# Patient Record
Sex: Female | Born: 1965 | Race: White | Hispanic: No | Marital: Married | Smoking: Former smoker
Health system: Southern US, Community
[De-identification: ages and names within clinical notes are randomized; demographics above are authoritative.]

## PROBLEM LIST (undated history)

## (undated) DIAGNOSIS — R519 Headache, unspecified: Secondary | ICD-10-CM

## (undated) DIAGNOSIS — K219 Gastro-esophageal reflux disease without esophagitis: Secondary | ICD-10-CM

## (undated) DIAGNOSIS — R51 Headache: Secondary | ICD-10-CM

## (undated) DIAGNOSIS — E039 Hypothyroidism, unspecified: Secondary | ICD-10-CM

## (undated) DIAGNOSIS — E079 Disorder of thyroid, unspecified: Secondary | ICD-10-CM

## (undated) HISTORY — PX: ABDOMINAL HYSTERECTOMY: SHX81

## (undated) HISTORY — DX: Disorder of thyroid, unspecified: E07.9

## (undated) HISTORY — PX: TONSILLECTOMY: SHX5217

## (undated) HISTORY — PX: CHOLECYSTECTOMY: SHX55

## (undated) HISTORY — PX: DIAGNOSTIC LAPAROSCOPY: SUR761

## (undated) HISTORY — DX: Headache, unspecified: R51.9

## (undated) HISTORY — DX: Headache: R51

---
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
## 1999-05-25 ENCOUNTER — Other Ambulatory Visit: Admission: RE | Admit: 1999-05-25 | Discharge: 1999-05-25 | Payer: Self-pay | Admitting: Obstetrics and Gynecology

## 2000-07-21 ENCOUNTER — Other Ambulatory Visit: Admission: RE | Admit: 2000-07-21 | Discharge: 2000-07-21 | Payer: Self-pay | Admitting: Obstetrics and Gynecology

## 2001-03-11 HISTORY — PX: APPENDECTOMY: SHX54

## 2001-11-16 ENCOUNTER — Other Ambulatory Visit: Admission: RE | Admit: 2001-11-16 | Discharge: 2001-11-16 | Payer: Self-pay | Admitting: Obstetrics and Gynecology

## 2002-10-27 ENCOUNTER — Other Ambulatory Visit: Admission: RE | Admit: 2002-10-27 | Discharge: 2002-10-27 | Payer: Self-pay | Admitting: Obstetrics and Gynecology

## 2003-05-29 ENCOUNTER — Inpatient Hospital Stay (HOSPITAL_COMMUNITY): Admission: AD | Admit: 2003-05-29 | Discharge: 2003-06-04 | Payer: Self-pay | Admitting: Obstetrics and Gynecology

## 2003-06-01 ENCOUNTER — Encounter (INDEPENDENT_AMBULATORY_CARE_PROVIDER_SITE_OTHER): Payer: Self-pay | Admitting: Specialist

## 2003-06-05 ENCOUNTER — Encounter: Admission: RE | Admit: 2003-06-05 | Discharge: 2003-07-05 | Payer: Self-pay | Admitting: Obstetrics and Gynecology

## 2003-06-28 ENCOUNTER — Other Ambulatory Visit: Admission: RE | Admit: 2003-06-28 | Discharge: 2003-06-28 | Payer: Self-pay | Admitting: Obstetrics and Gynecology

## 2003-07-06 ENCOUNTER — Encounter: Admission: RE | Admit: 2003-07-06 | Discharge: 2003-08-05 | Payer: Self-pay | Admitting: Obstetrics and Gynecology

## 2003-09-05 ENCOUNTER — Encounter: Admission: RE | Admit: 2003-09-05 | Discharge: 2003-10-05 | Payer: Self-pay | Admitting: Obstetrics and Gynecology

## 2004-09-17 ENCOUNTER — Ambulatory Visit: Payer: Self-pay | Admitting: Cardiology

## 2006-11-21 ENCOUNTER — Ambulatory Visit (HOSPITAL_COMMUNITY): Admission: RE | Admit: 2006-11-21 | Discharge: 2006-11-21 | Payer: Self-pay | Admitting: Obstetrics and Gynecology

## 2007-04-19 ENCOUNTER — Emergency Department (HOSPITAL_COMMUNITY): Admission: EM | Admit: 2007-04-19 | Discharge: 2007-04-19 | Payer: Self-pay | Admitting: Emergency Medicine

## 2008-01-31 ENCOUNTER — Emergency Department (HOSPITAL_COMMUNITY): Admission: EM | Admit: 2008-01-31 | Discharge: 2008-01-31 | Payer: Self-pay | Admitting: Family Medicine

## 2010-07-27 NOTE — Discharge Summary (Signed)
[name], [name] NO.:  [id number]   MEDICAL RECORD NO.:  [id number]                   PATIENT TYPE:  INP   LOCATION:  9142                                 FACILITY:  [facility]   PHYSICIAN:  Maxie Better, M.D.            DATE OF BIRTH:  1965/05/17   DATE OF ADMISSION:  05/29/2003  DATE OF DISCHARGE:  06/04/2003                                 DISCHARGE SUMMARY   ADMISSION DIAGNOSES:  1. Post dates.  2. Polyhydramnios.   DISCHARGE DIAGNOSES:  1. Post dates, delivered.  2. Polyhydramnios.  3. Arrest of dilatation.  4. Presumed chorioamnionitis.  5. Submucosal fibroid.  6. Postoperative anemia.  7. Gestational thrombocytopenia.   PROCEDURE:  Primary cesarean section, submucosal myomectomy.   HISTORY OF PRESENT ILLNESS:  A 45 year old gravida 3 para 0-0-2-0 female at  51 and four-sevenths weeks gestation with known polyhydramnios admitted for  induction secondary to post dates.   HOSPITAL COURSE:  The patient was admitted on May 29, 2003.  She underwent  several episodes of cervical ripening and Pitocin induction.  A Foley  balloon intracervical placement was done.  On May 31, 2003 she  subsequently had artificial rupture of membranes performed.  Intrauterine  pressure catheter and internal fetal scalp electrode were placed.  The  patient developed a temperature during her labor thought to be secondary to  chorioamnionitis and was started on ampicillin and gentamycin.  Pitocin was  continued; however, the patient did not progress beyond 4 cm.  She was taken  to the operating room where she underwent a primary cesarean section.  The  procedure resulted in the delivery of a live female, 8 pounds 5 ounces,  Apgars of 8 and 9.  There was a submucosal fibroid noted on palpation of the  intracavitary area and this was removed, sent to pathology.  Final pathology  confirmed leiomyoma.  Placenta was bilobed; otherwise, unremarkable.  The  patient was continued on antibiotics postoperatively and antibiotics were  discontinued when she became afebrile.  Her CBC on postoperative day #1  showed a hemoglobin of 10.3; hematocrit 29.3; platelet count of 111,000.  Her admission platelet count was 124,000 on May 29, 2003.  By  postoperative day #3 the patient was afebrile, was tolerating a regular  diet, without any evidence of infection, was deemed well to be discharged  home.   DISPOSITION:  Home.   CONDITION:  Stable.   DISCHARGE MEDICATIONS:  1. Tylox one to two tablets q.3-4h. p.r.n. pain.  2. Prenatal vitamins one p.o. daily.  3. Slow-Fe one p.o. daily.  4. Motrin 600 mg one p.o. q.6-8h. p.r.n. pain.   DISCHARGE INSTRUCTIONS:  Per postpartum booklet given.   FOLLOW-UP:  Follow-up appointment at [facility] in 4 weeks.  Maxie Better, M.D.    [place]/MEDQ  D:  06/24/2003  T:  06/24/2003  Job:  [id number]

## 2010-07-27 NOTE — Op Note (Signed)
[name], [name] NO.:  [id number]   MEDICAL RECORD NO.:  [id number]                   PATIENT TYPE:  INP   LOCATION:  9142                                 FACILITY:  [facility]   PHYSICIAN:  Maxie Better, M.D.            DATE OF BIRTH:  1965-05-01   DATE OF PROCEDURE:  06/01/2003  DATE OF DISCHARGE:                                 OPERATIVE REPORT   PREOPERATIVE DIAGNOSES:  1. Arrest of dilatation.  2. Post dates.  3. Presumed chorioamnionitis.  4. Polyhydramnios.   PROCEDURES:  1. Primary cesarean section.  2. Submucosal myomectomy.   POSTOPERATIVE DIAGNOSES:  1. Arrest of dilatation.  2. Post dates.  3. Polyhydramnios.  4. Presumed chorioamnionitis.  5. Submucosal fibroid.   ANESTHESIA:  Epidural.   SURGEON:  Maxie Better, M.D.   ASSISTANT:  Genia Del, M.D.   INDICATIONS:  This is a 45 year old gravida 3, para 0-0-2-0, female at 46-  4/7 weeks' gestation on May 29, 2003, who at that time was admitted for  induction of labor secondary to post dates and polyhydramnios.  The  patient's cervix was long and closed.  She was admitted on May 29, 2003,  and had plans for cervical ripening with Cervidil.  However, on presentation  the patient had too frequent contractions for Cervidil; therefore, low-dose  Pitocin was started.  Pitocin was discontinued during the night due to  questions of possible late decelerations, which subsequently resolved and  the tracing was otherwise reactive.  The following morning the patient  initially was restarted on the Pitocin; however, due to the cervix remaining  unfavorable and the contractions somewhat spaced, Cervidil was placed for  cervical ripening.  This was removed at about 9:30 p.m.  The patient was  rested and started on Pitocin on May 31, 2003.  The cervix, however, had  not done a marked change with the Cervidil and therefore a Foley balloon was  placed intracervically for  manual dilatation of the cervix.  The Pitocin was  continued.  The Foley balloon was finally out at around 12:30 a.m. on June 01, 2003, at which time the cervix was 3-4 cm dilated, about 80% effaced, -  2, vertex presentation.  Artificial rupture of membranes was performed at  that time, clear fluid was noted.  An intrauterine pressure catheter as well  as an internal fetal scalp electrode was placed.  Pitocin was continued.  At  around 4:30 a.m. the patient spiked a temperature to 100.7.  She was started  on ampicillin and gentamicin for presumed chorioamnionitis and given Tylenol  for fever.  The patient had an epidural placement.  By the examination by  the R.N., she was 5-6 cm dilated; however, about an hour later on re-  examination, the patient was noted to be 4 cm with an edematous cervix.  The  patient was continued on her Pitocin; however, she  did not have any further  dilatation and a decision was therefore made to proceed with a primary  cesarean section in light of the examination.  The risks and benefits of the  procedure had been explained to the patient and husband.  Consent was  signed.  The patient was transferred to the operating room.  Clindamycin was  added to her antibiotic regimen.   PROCEDURE:  Under adequate epidural anesthesia, the patient was placed in  the supine position with a left lateral tilt.  An indwelling Foley catheter  was already in place.  The patient was sterilely prepped and draped in the  usual fashion.  A Pfannenstiel skin incision was made after 0.25% Marcaine  was injected along the planned incision line.  The Pfannenstiel incision was  carried down to the rectus fascia.  The rectus fascia is incised in the  midline and extended bilaterally.  The rectus fascia was then bluntly and  sharply dissected off the rectus muscle in a superior and inferior fashion.  The rectus muscle was split in the midline.  The parietal peritoneum was  entered  sharply and extended superiorly and inferiorly.  The vesicouterine  peritoneum was then opened and extended transversely.  The bladder was then  bluntly dissected off the lower uterine segment and displaced inferiorly  with the bladder retractor.  A curvilinear low transverse uterine incision  was then made and extended bilaterally using bandage scissors.  On entry  into the pelvis, the baby was noted to be direct occiput posterior  presentation.  The baby was delivered as a live female from that position,  bulb-suctioned on the abdomen.  The cord was clamped, cut, the baby was  transferred to the awaiting pediatrician, who subsequently assigned Apgars  of 8 and 9 at one and five minutes.  Subsequent weight of the baby was 8  pounds 5 ounces.  The placenta was noted to be posterior, which was manually  removed.  The uterine cavity was then cleaned of debris, at which time there  was a palpable mass on the anterior wall of the uterus in intracavitary  location.  The finding was felt to be consistent with probably a submucosal  fibroid and was amenable for removal.  After making sure that all debris had  been removed from the uterine cavity, the Kocher was then used to grasp the  mass and the mass was then removed.  Due to its location, sutures were not  placed in the bed of the myoma site.  The uterine incision had no extension.  It was closed in two layers.  The first layer was a 0 Monocryl running  locked stitch.  The second layer was imbricated using 0 Monocryl suture.  Small bleeders along the peritoneal edges were cauterized.  On inspection of  the uterus, no other fibroid lesions were noted.  Both tubes and ovaries  were noted to be normal.  The abdomen was then irrigated and suctioned of  debris.  The paracolic gutters were cleaned of debris.  Reinspection of the  uterine incision showed good hemostasis.  The parietal peritoneum and the vesicouterine peritoneum were not closed.  The  undersurface of the rectus  fascia was inspected, small bleeders cauterized.  The rectus fascia was then  closed with 0 Vicryl x2, the subcutaneous areas irrigated, suctioned, small  bleeders cauterized, and the skin approximated using Ethicon staples.  Specimen was placenta, sent to pathology, as well as the submucosal myoma.  Estimated blood loss  was about 800 mL.  Intraoperative fluids 2500 mL  crystalloid.  Urine output was 150 mL clear yellow urine.  Sponge and  instrument count x2 was correct.  Complication was none.  The patient  tolerated the procedure well and was transferred to the recovery room in  stable condition.                                               Maxie Better, M.D.    [place]/MEDQ  D:  06/01/2003  T:  06/02/2003  Job:  [id number]

## 2010-07-31 ENCOUNTER — Other Ambulatory Visit: Payer: Self-pay | Admitting: Internal Medicine

## 2010-07-31 DIAGNOSIS — R1011 Right upper quadrant pain: Secondary | ICD-10-CM

## 2010-08-09 ENCOUNTER — Ambulatory Visit
Admission: RE | Admit: 2010-08-09 | Discharge: 2010-08-09 | Disposition: A | Discharge status: Home / Self Care | Payer: BC Managed Care – PPO | Source: Ambulatory Visit | Attending: Internal Medicine | Admitting: Internal Medicine

## 2010-08-09 DIAGNOSIS — R1011 Right upper quadrant pain: Secondary | ICD-10-CM

## 2010-08-29 ENCOUNTER — Encounter (INDEPENDENT_AMBULATORY_CARE_PROVIDER_SITE_OTHER): Payer: Self-pay | Admitting: General Surgery

## 2010-09-05 ENCOUNTER — Other Ambulatory Visit (INDEPENDENT_AMBULATORY_CARE_PROVIDER_SITE_OTHER): Payer: Self-pay | Admitting: General Surgery

## 2010-09-05 ENCOUNTER — Encounter (HOSPITAL_COMMUNITY): Payer: BC Managed Care – PPO

## 2010-09-05 ENCOUNTER — Ambulatory Visit (HOSPITAL_COMMUNITY)
Admission: RE | Admit: 2010-09-05 | Discharge: 2010-09-05 | Disposition: A | Discharge status: Home / Self Care | Payer: BC Managed Care – PPO | Source: Ambulatory Visit | Attending: General Surgery | Admitting: General Surgery

## 2010-09-05 DIAGNOSIS — K8020 Calculus of gallbladder without cholecystitis without obstruction: Secondary | ICD-10-CM | POA: Insufficient documentation

## 2010-09-05 DIAGNOSIS — Z01812 Encounter for preprocedural laboratory examination: Secondary | ICD-10-CM | POA: Insufficient documentation

## 2010-09-05 DIAGNOSIS — Z01811 Encounter for preprocedural respiratory examination: Secondary | ICD-10-CM | POA: Insufficient documentation

## 2010-09-05 LAB — COMPREHENSIVE METABOLIC PANEL
ALT: 21 U/L (ref 0–35)
Albumin: 4.2 g/dL (ref 3.5–5.2)
Alkaline Phosphatase: 86 U/L (ref 39–117)
CO2: 29 mEq/L (ref 19–32)
Chloride: 102 mEq/L (ref 96–112)
Creatinine, Ser: 0.65 mg/dL (ref 0.50–1.10)
GFR calc non Af Amer: >60 mL/min (ref >60)
Glucose, Bld: 92 mg/dL (ref 70–99)
Potassium: 4.1 mEq/L (ref 3.5–5.1)
Total Bilirubin: 0.7 mg/dL (ref 0.3–1.2)
Total Protein: 6.7 g/dL (ref 6.0–8.3)

## 2010-09-05 LAB — CBC
HCT: 39.7 % (ref 36.0–46.0)
RBC: 4.46 MIL/uL (ref 3.87–5.11)
WBC: 8.9 10*3/uL (ref 4.0–10.5)

## 2010-09-05 LAB — DIFFERENTIAL
Basophils Absolute: 0 10*3/uL (ref 0.0–0.1)
Lymphs Abs: 2.8 10*3/uL (ref 0.7–4.0)
Neutrophils Relative %: 59 % (ref 43–77)

## 2010-09-05 LAB — PROTIME-INR
INR: 1.06 (ref 0.00–1.49)
Prothrombin Time: 14 seconds (ref 11.6–15.2)

## 2010-09-17 ENCOUNTER — Ambulatory Visit (HOSPITAL_COMMUNITY)
Admission: RE | Admit: 2010-09-17 | Discharge: 2010-09-17 | Disposition: A | Discharge status: Home / Self Care | Payer: BC Managed Care – PPO | Source: Ambulatory Visit | Attending: General Surgery | Admitting: General Surgery

## 2010-09-17 ENCOUNTER — Ambulatory Visit (HOSPITAL_COMMUNITY): Payer: BC Managed Care – PPO

## 2010-09-17 ENCOUNTER — Other Ambulatory Visit (INDEPENDENT_AMBULATORY_CARE_PROVIDER_SITE_OTHER): Payer: Self-pay | Admitting: General Surgery

## 2010-09-17 DIAGNOSIS — Z79899 Other long term (current) drug therapy: Secondary | ICD-10-CM | POA: Insufficient documentation

## 2010-09-17 DIAGNOSIS — K8010 Calculus of gallbladder with chronic cholecystitis without obstruction: Secondary | ICD-10-CM | POA: Insufficient documentation

## 2010-09-17 DIAGNOSIS — F17200 Nicotine dependence, unspecified, uncomplicated: Secondary | ICD-10-CM | POA: Insufficient documentation

## 2010-09-17 DIAGNOSIS — E039 Hypothyroidism, unspecified: Secondary | ICD-10-CM | POA: Insufficient documentation

## 2010-09-18 NOTE — Op Note (Signed)
[name], [name] NO.:  [id number]  MEDICAL RECORD NO.:  [id number]  LOCATION:  DAYL                         FACILITY:  [facility]  PHYSICIAN:  Adolph Pollack, M.D.DATE OF BIRTH:  23-May-1965  DATE OF PROCEDURE:  09/17/2010 DATE OF DISCHARGE:                              OPERATIVE REPORT   PREOPERATIVE DIAGNOSIS:  Symptomatic cholelithiasis.  POSTOPERATIVE DIAGNOSIS:  Symptomatic cholelithiasis.  PROCEDURE:  Laparoscopic cholecystectomy with intraoperative cholangiogram.  SURGEON:  Adolph Pollack, M.D.  ASSISTANT:  Consuello Bossier, MD  ANESTHESIA:  General.  INDICATIONS:  Ms. Deschamps is a 45 year old female who has been having problems with symptomatic cholelithiasis for over 2-1/2 years.  Recently she had some elevation of her transaminases.  She has more frequent episodes.  She now presents for elective laparoscopic cholecystectomy. Procedure risks and aftercare were discussed with her preoperatively. Her white blood cell count and liver function tests were normal preoperatively.  TECHNIQUE:  She was seen in the holding area, then brought to the operating room, placed supine on the operating room table and general anesthetic was administered.  The abdominal wall was widely sterilely prepped and draped.  In the subumbilical region, Marcaine was infiltrated into the subcutaneous tissues.  A small subumbilical incision was made through the skin and subcutaneous tissue.  The midline fascia was identified and a small incision made in it.  The midline fascia was retracted anteriorly.  The peritoneal cavity was then entered under direct vision.  A pursestring suture of 0 Vicryl was placed around the fascial edges.  A Hassan trocar was introduced into the peritoneal cavity and pneumoperitoneum created by insufflation of CO2 gas.  Next, a laparoscope was introduced.  There was no underlying organ injury or bleeding.  A 5-mm trocar was placed through  an epigastric incision and two 5-mm trocars were placed through right upper quadrant incisions.  She was placed in reverse Trendelenburg position with right side tilted slightly up.  The fundus of the gallbladder was grasped. There were multiple adhesions between the omentum and gallbladder and the liver near the fundus of the gallbladder.  These were taken down with electrocautery and countertraction.  The fundus was then retracted towards the right shoulder.  The infundibulum was grasped and using dissection on the gallbladder, I mobilized the infundibulum.  It was retracted laterally.  I identified the cystic duct and created a window around it and achieved the critical view.  A clip was placed at the cystic duct gallbladder junction.  I identified the cystic artery and created a window around it and clipped it twice on the proximal side.  A small incision was made into the cystic duct.  A cholangiocath was passed through the abdominal wall placed into the cystic duct and cholangiogram was performed.  Under real-time fluoroscopy, dilute contrast was injected into the cystic duct.  The common hepatic, right and left hepatic and common bile ducts all filled promptly.  Contrast drained promptly into the duodenum .  A round, nonmobile, nonobstructing lucency was noted in the common bile duct.  It appeared to be an overlying bony shadow versus a stone.  Final report is pending radiologist interpretation.  Following  this, the cholangiocatheter was removed, the cystic duct was clipped 3 times on the biliary side and divided.  I then dissected out the cystic artery and clipped it close to the gallbladder.  Two previous clips had already been placed proximally.  It was divided.  I then dissected the gallbladder free from liver using electrocautery.  At the midbody of the gallbladder appeared to be a blood vessel or accessory duct going directly into gallbladder.  This was clipped on the  liver side and divided with electrocautery on the gallbladder side.  The gallbladder was then removed from the liver and placed in Endopouch bag. The gallbladder fossa was irrigated and bleeding was controlled with electrocautery.  The Endopouch bag was removed through the subumbilical port.  Gallbladder was sent to pathology.  Further inspection the gallbladder fossa demonstrated no evidence of bleeding or bile leak.  Irrigation fluid was evacuated as much as possible.  The subumbilical trocar was removed and the fascial defect closed under laparoscopic vision by tightening up and tying down the pursestring suture.  The carbon dioxide gas was released and the trocar removed. Skin incisions were closed with 4-0 Monocryl subcuticular stitches. Steri-Strips and sterile dressing applied.  She tolerated the procedure well without apparent complications and was taken to recovery room in satisfactory condition.     Adolph Pollack, M.D.     Kari Baars  D:  09/17/2010  T:  09/17/2010  Job:  [id number]  cc:   Juline Patch, M.D. Fax: [phone]  Electronically Signed by Avel Peace M.D. on 09/18/2010 10:30:07 AM

## 2010-10-11 ENCOUNTER — Ambulatory Visit (INDEPENDENT_AMBULATORY_CARE_PROVIDER_SITE_OTHER): Payer: BC Managed Care – PPO | Admitting: General Surgery

## 2010-10-11 ENCOUNTER — Encounter (INDEPENDENT_AMBULATORY_CARE_PROVIDER_SITE_OTHER): Payer: Self-pay | Admitting: General Surgery

## 2010-10-11 DIAGNOSIS — K8020 Calculus of gallbladder without cholecystitis without obstruction: Secondary | ICD-10-CM | POA: Insufficient documentation

## 2010-10-11 NOTE — Patient Instructions (Signed)
You may have a gallstone in your bile duct or it may be a bone shadow.  I recommend considering an MRI to figure out which it is.  You could also doing nothing at this time and just see how things went.  Please call us and let us know which of these you would like to do.

## 2010-10-11 NOTE — Progress Notes (Signed)
Operation:  Laparoscopic cholecystectomy  Date:  September 17, 2010  Pathology:  Benign  HPI:  She is here for her first postoperative visit. She's feels well and has no complaints. She wishes she would have had the operation sooner.  No diarrhea or food intolerance. Her cholangiogram did demonstrate the possibility of sludge or a stone.  Intraoperatively, it looked like it could be a bone shadow. I explained this to her.   Physical Exam: Gen.-she looks well and in no acute distress.  Abdomen-soft, incisions clean, dry, intact. No tenderness.  Assessment:  Doing well postop. There is a question of choledocholithiasis.  Plan:  We discussed options of expected management versus MRI. I recommended leaning toward the MRI to rule this in or out. She wants to speak with her husband. I've asked her to call us back when she has made the decision how she would like to proceed.

## 2010-11-30 LAB — URINALYSIS, ROUTINE W REFLEX MICROSCOPIC
Bilirubin Urine: NEGATIVE
Glucose, UA: NEGATIVE
Hgb urine dipstick: NEGATIVE
Ketones, ur: NEGATIVE
Nitrite: NEGATIVE
Protein, ur: NEGATIVE
Specific Gravity, Urine: 1.013
Urobilinogen, UA: 0.2
pH: 7

## 2010-11-30 LAB — POCT PREGNANCY, URINE
Operator id: 192351
Preg Test, Ur: NEGATIVE

## 2010-12-11 LAB — LIPASE, BLOOD: Lipase: 17

## 2010-12-11 LAB — CBC
HCT: 39.2
MCHC: 34.9
MCV: 92
RDW: 12.4

## 2010-12-11 LAB — COMPREHENSIVE METABOLIC PANEL
CO2: 26
Potassium: 3.7
Sodium: 135
Total Bilirubin: 1.3 — ABNORMAL HIGH
Total Protein: 6.6

## 2010-12-11 LAB — URINE MICROSCOPIC-ADD ON

## 2010-12-11 LAB — DIFFERENTIAL
Lymphs Abs: 0.6 — ABNORMAL LOW
Monocytes Absolute: 0.2
Monocytes Relative: 2 — ABNORMAL LOW
Neutro Abs: 10.9 — ABNORMAL HIGH

## 2010-12-11 LAB — POCT PREGNANCY, URINE: Preg Test, Ur: NEGATIVE

## 2010-12-11 LAB — URINALYSIS, ROUTINE W REFLEX MICROSCOPIC
Glucose, UA: NEGATIVE
Ketones, ur: >80 — AB
Nitrite: NEGATIVE
pH: 6

## 2012-09-14 ENCOUNTER — Other Ambulatory Visit: Payer: Self-pay | Admitting: Obstetrics and Gynecology

## 2012-09-18 ENCOUNTER — Encounter (HOSPITAL_COMMUNITY): Payer: Self-pay | Admitting: Pharmacist

## 2012-09-29 ENCOUNTER — Encounter (HOSPITAL_COMMUNITY): Payer: Self-pay | Admitting: *Deleted

## 2012-09-29 ENCOUNTER — Encounter (HOSPITAL_COMMUNITY)
Admission: RE | Disposition: A | Discharge status: Home / Self Care | Payer: Self-pay | Source: Ambulatory Visit | Attending: Obstetrics and Gynecology

## 2012-09-29 ENCOUNTER — Ambulatory Visit (HOSPITAL_COMMUNITY)
Admission: RE | Admit: 2012-09-29 | Discharge: 2012-09-29 | Disposition: A | Discharge status: Home / Self Care | Payer: BC Managed Care – PPO | Source: Ambulatory Visit | Attending: Obstetrics and Gynecology | Admitting: Obstetrics and Gynecology

## 2012-09-29 ENCOUNTER — Encounter (HOSPITAL_COMMUNITY): Payer: Self-pay | Admitting: Anesthesiology

## 2012-09-29 ENCOUNTER — Ambulatory Visit (HOSPITAL_COMMUNITY): Payer: BC Managed Care – PPO | Admitting: Anesthesiology

## 2012-09-29 DIAGNOSIS — N920 Excessive and frequent menstruation with regular cycle: Secondary | ICD-10-CM | POA: Insufficient documentation

## 2012-09-29 DIAGNOSIS — D251 Intramural leiomyoma of uterus: Secondary | ICD-10-CM | POA: Insufficient documentation

## 2012-09-29 DIAGNOSIS — N946 Dysmenorrhea, unspecified: Secondary | ICD-10-CM | POA: Insufficient documentation

## 2012-09-29 DIAGNOSIS — Z90710 Acquired absence of both cervix and uterus: Secondary | ICD-10-CM

## 2012-09-29 DIAGNOSIS — N7013 Chronic salpingitis and oophoritis: Secondary | ICD-10-CM | POA: Insufficient documentation

## 2012-09-29 HISTORY — PX: ROBOTIC ASSISTED TOTAL HYSTERECTOMY: SHX6085

## 2012-09-29 LAB — CBC
HCT: 42.2 % (ref 36.0–46.0)
HCT: 39.6 % (ref 36.0–46.0)
Hemoglobin: 14.9 g/dL (ref 12.0–15.0)
MCHC: 35.9 g/dL (ref 30.0–36.0)
MCV: 87.8 fL (ref 78.0–100.0)
RBC: 4.81 MIL/uL (ref 3.87–5.11)
RDW: 12.3 % (ref 11.5–15.5)
RDW: 12.3 % (ref 11.5–15.5)
WBC: 9.5 10*3/uL (ref 4.0–10.5)

## 2012-09-29 LAB — BASIC METABOLIC PANEL
BUN: 6 mg/dL (ref 6–23)
CO2: 29 mEq/L (ref 19–32)
CO2: 26 mEq/L (ref 19–32)
Chloride: 102 mEq/L (ref 96–112)
Chloride: 102 mEq/L (ref 96–112)
Creatinine, Ser: 0.72 mg/dL (ref 0.50–1.10)
Creatinine, Ser: 0.66 mg/dL (ref 0.50–1.10)
GFR calc Af Amer: >90 mL/min (ref >90)
GFR calc Af Amer: >90 mL/min (ref >90)
Potassium: 3.5 mEq/L (ref 3.5–5.1)
Sodium: 139 mEq/L (ref 135–145)

## 2012-09-29 SURGERY — ROBOTIC ASSISTED TOTAL HYSTERECTOMY
Anesthesia: General | Site: Abdomen | Laterality: Bilateral | Wound class: Clean Contaminated

## 2012-09-29 MED ORDER — FENTANYL CITRATE 0.05 MG/ML IJ SOLN
INTRAMUSCULAR | Status: AC
  Filled 2012-09-29: qty 5

## 2012-09-29 MED ORDER — METHYLENE BLUE 1 % INJ SOLN
INTRAMUSCULAR | Status: DC | PRN
  Administered 2012-09-29: 1 mL

## 2012-09-29 MED ORDER — MIDAZOLAM HCL 2 MG/2ML IJ SOLN
INTRAMUSCULAR | Status: AC
  Filled 2012-09-29: qty 2

## 2012-09-29 MED ORDER — BUPIVACAINE HCL (PF) 0.25 % IJ SOLN
INTRAMUSCULAR | Status: AC
  Filled 2012-09-29: qty 60

## 2012-09-29 MED ORDER — DEXAMETHASONE SODIUM PHOSPHATE 10 MG/ML IJ SOLN
INTRAMUSCULAR | Status: AC
  Filled 2012-09-29: qty 1

## 2012-09-29 MED ORDER — KETOROLAC TROMETHAMINE 30 MG/ML IJ SOLN
INTRAMUSCULAR | Status: AC
  Filled 2012-09-29: qty 1

## 2012-09-29 MED ORDER — PHENYLEPHRINE HCL 10 MG/ML IJ SOLN
INTRAMUSCULAR | Status: DC | PRN
  Administered 2012-09-29: 40 ug via INTRAVENOUS
  Administered 2012-09-29 (×3): 80 ug via INTRAVENOUS

## 2012-09-29 MED ORDER — ACETAMINOPHEN 10 MG/ML IV SOLN: 1000.0000 mg | Freq: Four times a day (QID) | INTRAVENOUS | Status: DC

## 2012-09-29 MED ORDER — ACETAMINOPHEN 10 MG/ML IV SOLN
INTRAVENOUS | Status: AC
  Filled 2012-09-29: qty 100

## 2012-09-29 MED ORDER — KETOROLAC TROMETHAMINE 30 MG/ML IJ SOLN: 30.0000 mg | Freq: Four times a day (QID) | INTRAMUSCULAR | Status: DC

## 2012-09-29 MED ORDER — DEXAMETHASONE SODIUM PHOSPHATE 10 MG/ML IJ SOLN
INTRAMUSCULAR | Status: DC | PRN
  Administered 2012-09-29: 10 mg via INTRAVENOUS

## 2012-09-29 MED ORDER — ONDANSETRON HCL 4 MG/2ML IJ SOLN: 4.0000 mg | Freq: Four times a day (QID) | INTRAMUSCULAR | Status: DC | PRN

## 2012-09-29 MED ORDER — GLYCOPYRROLATE 0.2 MG/ML IJ SOLN
INTRAMUSCULAR | Status: DC | PRN
  Administered 2012-09-29: 0.4 mg via INTRAVENOUS

## 2012-09-29 MED ORDER — MIDAZOLAM HCL 5 MG/5ML IJ SOLN
INTRAMUSCULAR | Status: DC | PRN
  Administered 2012-09-29: 2 mg via INTRAVENOUS

## 2012-09-29 MED ORDER — ONDANSETRON HCL 4 MG PO TABS: 4.0000 mg | ORAL_TABLET | Freq: Four times a day (QID) | ORAL | Status: DC | PRN

## 2012-09-29 MED ORDER — CEFAZOLIN SODIUM-DEXTROSE 2-3 GM-% IV SOLR
INTRAVENOUS | Status: AC
  Filled 2012-09-29: qty 50

## 2012-09-29 MED ORDER — KETOROLAC TROMETHAMINE 30 MG/ML IJ SOLN: 15.0000 mg | Freq: Once | INTRAMUSCULAR | Status: DC | PRN

## 2012-09-29 MED ORDER — OXYCODONE-ACETAMINOPHEN 5-325 MG PO TABS: 1.0000 | ORAL_TABLET | ORAL | Status: DC | PRN

## 2012-09-29 MED ORDER — MENTHOL 3 MG MT LOZG: 1.0000 | LOZENGE | OROMUCOSAL | Status: DC | PRN

## 2012-09-29 MED ORDER — ONDANSETRON HCL 4 MG/2ML IJ SOLN
INTRAMUSCULAR | Status: DC | PRN
  Administered 2012-09-29: 4 mg via INTRAVENOUS

## 2012-09-29 MED ORDER — BUPIVACAINE HCL (PF) 0.25 % IJ SOLN
INTRAMUSCULAR | Status: DC | PRN
  Administered 2012-09-29: 12 mL

## 2012-09-29 MED ORDER — ONDANSETRON HCL 4 MG/2ML IJ SOLN
INTRAMUSCULAR | Status: AC
  Filled 2012-09-29: qty 2

## 2012-09-29 MED ORDER — NEOSTIGMINE METHYLSULFATE 1 MG/ML IJ SOLN
INTRAMUSCULAR | Status: AC
  Filled 2012-09-29: qty 1

## 2012-09-29 MED ORDER — FENTANYL CITRATE 0.05 MG/ML IJ SOLN
INTRAMUSCULAR | Status: AC
  Filled 2012-09-29: qty 2

## 2012-09-29 MED ORDER — HYDROMORPHONE HCL PF 1 MG/ML IJ SOLN: 0.2000 mg | INTRAMUSCULAR | Status: DC | PRN

## 2012-09-29 MED ORDER — IBUPROFEN 800 MG PO TABS
800.0000 mg | ORAL_TABLET | Freq: Three times a day (TID) | ORAL | Status: DC | PRN
  Administered 2012-09-29: 800 mg via ORAL
  Filled 2012-09-29: qty 1

## 2012-09-29 MED ORDER — IBUPROFEN 800 MG PO TABS: 800.0000 mg | ORAL_TABLET | Freq: Three times a day (TID) | ORAL | Status: DC | PRN

## 2012-09-29 MED ORDER — METHYLENE BLUE 1 % INJ SOLN
INTRAMUSCULAR | Status: AC
  Filled 2012-09-29: qty 1

## 2012-09-29 MED ORDER — PROPOFOL 10 MG/ML IV BOLUS
INTRAVENOUS | Status: DC | PRN
  Administered 2012-09-29: 180 mg via INTRAVENOUS

## 2012-09-29 MED ORDER — ROCURONIUM BROMIDE 100 MG/10ML IV SOLN
INTRAVENOUS | Status: DC | PRN
  Administered 2012-09-29: 5 mg via INTRAVENOUS
  Administered 2012-09-29: 10 mg via INTRAVENOUS
  Administered 2012-09-29: 40 mg via INTRAVENOUS
  Administered 2012-09-29: 10 mg via INTRAVENOUS

## 2012-09-29 MED ORDER — ZOLPIDEM TARTRATE 5 MG PO TABS: 5.0000 mg | ORAL_TABLET | Freq: Every evening | ORAL | Status: DC | PRN

## 2012-09-29 MED ORDER — NEOSTIGMINE METHYLSULFATE 1 MG/ML IJ SOLN
INTRAMUSCULAR | Status: DC | PRN
  Administered 2012-09-29: 3 mg via INTRAVENOUS

## 2012-09-29 MED ORDER — FENTANYL CITRATE 0.05 MG/ML IJ SOLN
25.0000 ug | INTRAMUSCULAR | Status: DC | PRN
  Administered 2012-09-29 (×3): 50 ug via INTRAVENOUS

## 2012-09-29 MED ORDER — KETOROLAC TROMETHAMINE 30 MG/ML IJ SOLN
INTRAMUSCULAR | Status: DC | PRN
  Administered 2012-09-29: 30 mg via INTRAVENOUS

## 2012-09-29 MED ORDER — CEFAZOLIN SODIUM-DEXTROSE 2-3 GM-% IV SOLR
2.0000 g | INTRAVENOUS | Status: AC
  Administered 2012-09-29: 2 g via INTRAVENOUS

## 2012-09-29 MED ORDER — PANTOPRAZOLE SODIUM 40 MG PO TBEC
40.0000 mg | DELAYED_RELEASE_TABLET | Freq: Every day | ORAL | Status: DC
  Administered 2012-09-29: 40 mg via ORAL
  Filled 2012-09-29 (×2): qty 1

## 2012-09-29 MED ORDER — GLYCOPYRROLATE 0.2 MG/ML IJ SOLN
INTRAMUSCULAR | Status: AC
  Filled 2012-09-29: qty 2

## 2012-09-29 MED ORDER — LIDOCAINE HCL (CARDIAC) 20 MG/ML IV SOLN
INTRAVENOUS | Status: AC
  Filled 2012-09-29: qty 5

## 2012-09-29 MED ORDER — ACETAMINOPHEN 10 MG/ML IV SOLN
1000.0000 mg | Freq: Once | INTRAVENOUS | Status: AC
  Administered 2012-09-29: 1000 mg via INTRAVENOUS

## 2012-09-29 MED ORDER — ROCURONIUM BROMIDE 50 MG/5ML IV SOLN
INTRAVENOUS | Status: AC
  Filled 2012-09-29: qty 1

## 2012-09-29 MED ORDER — DIPHENHYDRAMINE HCL 50 MG/ML IJ SOLN
INTRAMUSCULAR | Status: DC | PRN
  Administered 2012-09-29: 25 mg via INTRAVENOUS

## 2012-09-29 MED ORDER — PROPOFOL 10 MG/ML IV EMUL
INTRAVENOUS | Status: AC
  Filled 2012-09-29: qty 20

## 2012-09-29 MED ORDER — LACTATED RINGERS IV SOLN
INTRAVENOUS | Status: DC
  Administered 2012-09-29 (×3): via INTRAVENOUS

## 2012-09-29 MED ORDER — LIDOCAINE HCL (CARDIAC) 20 MG/ML IV SOLN
INTRAVENOUS | Status: DC | PRN
  Administered 2012-09-29: 50 mg via INTRAVENOUS

## 2012-09-29 MED ORDER — LACTATED RINGERS IR SOLN
Status: DC | PRN
  Administered 2012-09-29: 3000 mL

## 2012-09-29 MED ORDER — DIPHENHYDRAMINE HCL 50 MG/ML IJ SOLN
INTRAMUSCULAR | Status: AC
  Filled 2012-09-29: qty 1

## 2012-09-29 MED ORDER — PHENYLEPHRINE 40 MCG/ML (10ML) SYRINGE FOR IV PUSH (FOR BLOOD PRESSURE SUPPORT)
PREFILLED_SYRINGE | INTRAVENOUS | Status: AC
  Filled 2012-09-29: qty 10

## 2012-09-29 MED ORDER — DEXTROSE IN LACTATED RINGERS 5 % IV SOLN: INTRAVENOUS | Status: DC

## 2012-09-29 MED ORDER — FENTANYL CITRATE 0.05 MG/ML IJ SOLN
INTRAMUSCULAR | Status: DC | PRN
  Administered 2012-09-29: 100 ug via INTRAVENOUS
  Administered 2012-09-29: 25 ug via INTRAVENOUS
  Administered 2012-09-29: 100 ug via INTRAVENOUS
  Administered 2012-09-29: 25 ug via INTRAVENOUS

## 2012-09-29 MED ORDER — LEVOTHYROXINE SODIUM 50 MCG PO TABS
50.0000 ug | ORAL_TABLET | Freq: Every day | ORAL | Status: DC
  Filled 2012-09-29 (×2): qty 1

## 2012-09-29 SURGICAL SUPPLY — 77 items
APPLICATOR COTTON TIP 6IN STRL (MISCELLANEOUS) ×3 IMPLANT
BAG URINE DRAINAGE (UROLOGICAL SUPPLIES) ×3 IMPLANT
BARRIER ADHS 3X4 INTERCEED (GAUZE/BANDAGES/DRESSINGS) ×3 IMPLANT
CABLE HIGH FREQUENCY MONO STRZ (ELECTRODE) IMPLANT
CATH FOLEY 3WAY  5CC 16FR (CATHETERS) ×1
CATH FOLEY 3WAY 5CC 16FR (CATHETERS) ×2 IMPLANT
CATH ROBINSON RED A/P 16FR (CATHETERS) ×3 IMPLANT
CHLORAPREP W/TINT 26ML (MISCELLANEOUS) ×3 IMPLANT
CLOTH BEACON ORANGE TIMEOUT ST (SAFETY) ×3 IMPLANT
CONT PATH 16OZ SNAP LID 3702 (MISCELLANEOUS) ×3 IMPLANT
COVER MAYO STAND STRL (DRAPES) ×3 IMPLANT
COVER TABLE BACK 60X90 (DRAPES) ×6 IMPLANT
COVER TIP SHEARS 8 DVNC (MISCELLANEOUS) ×2 IMPLANT
COVER TIP SHEARS 8MM DA VINCI (MISCELLANEOUS) ×1
DECANTER SPIKE VIAL GLASS SM (MISCELLANEOUS) ×3 IMPLANT
DERMABOND ADVANCED (GAUZE/BANDAGES/DRESSINGS) ×1
DERMABOND ADVANCED .7 DNX12 (GAUZE/BANDAGES/DRESSINGS) ×2 IMPLANT
DEVICE TROCAR PUNCTURE CLOSURE (ENDOMECHANICALS) IMPLANT
DRAPE HUG U DISPOSABLE (DRAPE) ×3 IMPLANT
DRAPE LG THREE QUARTER DISP (DRAPES) ×6 IMPLANT
DRAPE WARM FLUID 44X44 (DRAPE) ×3 IMPLANT
ELECT LIGASURE LONG (ELECTRODE) IMPLANT
ELECT REM PT RETURN 9FT ADLT (ELECTROSURGICAL) ×3
ELECTRODE REM PT RTRN 9FT ADLT (ELECTROSURGICAL) ×2 IMPLANT
EVACUATOR SMOKE 8.L (FILTER) ×3 IMPLANT
FORCEPS CUTTING 33CM 5MM (CUTTING FORCEPS) IMPLANT
FORCEPS CUTTING 45CM 5MM (CUTTING FORCEPS) IMPLANT
GAUZE VASELINE 3X9 (GAUZE/BANDAGES/DRESSINGS) IMPLANT
GLOVE BIO SURGEON STRL SZ 6.5 (GLOVE) ×3 IMPLANT
GLOVE BIOGEL PI IND STRL 7.0 (GLOVE) ×4 IMPLANT
GLOVE BIOGEL PI INDICATOR 7.0 (GLOVE) ×2
GOWN PREVENTION PLUS LG XLONG (DISPOSABLE) ×9 IMPLANT
GOWN STRL REIN XL XLG (GOWN DISPOSABLE) ×18 IMPLANT
HEMOSTAT SURGICEL 4X8 (HEMOSTASIS) ×3 IMPLANT
KIT ACCESSORY DA VINCI DISP (KITS) ×1
KIT ACCESSORY DVNC DISP (KITS) ×2 IMPLANT
LEGGING LITHOTOMY PAIR STRL (DRAPES) ×3 IMPLANT
NEEDLE INSUFFLATION 120MM (ENDOMECHANICALS) ×3 IMPLANT
NEEDLE INSUFFLATION 14GA 120MM (NEEDLE) ×3 IMPLANT
NS IRRIG 1000ML POUR BTL (IV SOLUTION) ×3 IMPLANT
OCCLUDER COLPOPNEUMO (BALLOONS) ×3 IMPLANT
PACK LAPAROSCOPY BASIN (CUSTOM PROCEDURE TRAY) ×3 IMPLANT
PACK LAVH (CUSTOM PROCEDURE TRAY) ×3 IMPLANT
PAD PREP 24X48 CUFFED NSTRL (MISCELLANEOUS) ×6 IMPLANT
PLUG CATH AND CAP STER (CATHETERS) ×3 IMPLANT
POUCH SPECIMEN RETRIEVAL 10MM (ENDOMECHANICALS) IMPLANT
PROTECTOR NERVE ULNAR (MISCELLANEOUS) ×6 IMPLANT
SCISSORS LAP 5X35 DISP (ENDOMECHANICALS) IMPLANT
SET CYSTO W/LG BORE CLAMP LF (SET/KITS/TRAYS/PACK) IMPLANT
SET IRRIG TUBING LAPAROSCOPIC (IRRIGATION / IRRIGATOR) ×3 IMPLANT
SOLUTION ELECTROLUBE (MISCELLANEOUS) ×3 IMPLANT
SUT VIC AB 0 CT1 36 (SUTURE) ×6 IMPLANT
SUT VICRYL 0 UR6 27IN ABS (SUTURE) ×3 IMPLANT
SUT VICRYL 4-0 PS2 18IN ABS (SUTURE) ×12 IMPLANT
SUT VLOC 180 0 9IN  GS21 (SUTURE) ×2
SUT VLOC 180 0 9IN GS21 (SUTURE) ×4 IMPLANT
SYR 50ML LL SCALE MARK (SYRINGE) ×3 IMPLANT
SYRINGE 10CC LL (SYRINGE) ×3 IMPLANT
SYSTEM CONVERTIBLE TROCAR (TROCAR) IMPLANT
TIP RUMI ORANGE 6.7MMX12CM (TIP) IMPLANT
TIP UTERINE 5.1X6CM LAV DISP (MISCELLANEOUS) IMPLANT
TIP UTERINE 6.7X10CM GRN DISP (MISCELLANEOUS) ×3 IMPLANT
TIP UTERINE 6.7X6CM WHT DISP (MISCELLANEOUS) IMPLANT
TIP UTERINE 6.7X8CM BLUE DISP (MISCELLANEOUS) ×6 IMPLANT
TOWEL OR 17X24 6PK STRL BLUE (TOWEL DISPOSABLE) ×9 IMPLANT
TROCAR 12M 150ML BLUNT (TROCAR) IMPLANT
TROCAR BALLN 12MMX100 BLUNT (TROCAR) IMPLANT
TROCAR DISP BLADELESS 8 DVNC (TROCAR) ×2 IMPLANT
TROCAR DISP BLADELESS 8MM (TROCAR) ×1
TROCAR OPTI TIP 5M 100M (ENDOMECHANICALS) ×6 IMPLANT
TROCAR XCEL 12X100 BLDLESS (ENDOMECHANICALS) ×3 IMPLANT
TROCAR XCEL DIL TIP R 11M (ENDOMECHANICALS) ×3 IMPLANT
TROCAR XCEL NON-BLD 5MMX100MML (ENDOMECHANICALS) ×3 IMPLANT
TROCAR Z-THREAD 12X150 (TROCAR) ×3 IMPLANT
TUBING FILTER THERMOFLATOR (ELECTROSURGICAL) ×3 IMPLANT
WARMER LAPAROSCOPE (MISCELLANEOUS) ×3 IMPLANT
WATER STERILE IRR 1000ML POUR (IV SOLUTION) ×9 IMPLANT

## 2012-09-29 NOTE — Anesthesia Preprocedure Evaluation (Signed)
Anesthesia Evaluation  Patient identified by MRN, date of birth, ID band Patient awake    Reviewed: Allergy & Precautions, H&P , NPO status , Patient's Chart, lab work & pertinent test results, reviewed documented beta blocker date and time   Airway Mallampati: I TM Distance: >3 FB Neck ROM: full    Dental  (+) Teeth Intact   Pulmonary Current Smoker,  breath sounds clear to auscultation  Pulmonary exam normal       Cardiovascular Exercise Tolerance: Good Rhythm:regular Rate:Normal     Neuro/Psych  Headaches, negative psych ROS   GI/Hepatic negative GI ROS, Neg liver ROS,   Endo/Other  Hypothyroidism   Renal/GU negative Renal ROS  Female GU complaint     Musculoskeletal   Abdominal   Peds  Hematology negative hematology ROS (+)   Anesthesia Other Findings   Reproductive/Obstetrics negative OB ROS                           Anesthesia Physical Anesthesia Plan  ASA: II  Anesthesia Plan: General ETT   Post-op Pain Management:    Induction:   Airway Management Planned:   Additional Equipment:   Intra-op Plan:   Post-operative Plan:   Informed Consent: I have reviewed the patients History and Physical, chart, labs and discussed the procedure including the risks, benefits and alternatives for the proposed anesthesia with the patient or authorized representative who has indicated his/her understanding and acceptance.   Dental Advisory Given  Plan Discussed with: CRNA and Surgeon  Anesthesia Plan Comments:         Anesthesia Quick Evaluation

## 2012-09-29 NOTE — Progress Notes (Signed)
Discharge instructions given to patient and significant other at bedside.  Activity, medications, follow up appointments, when to call the doctor and community resources discussed.  No questions at this time.  Patient left unit in stable condition with all personal belongings and prescriptions accompanied by staff.  Osvaldo Angst, RN----------

## 2012-09-29 NOTE — Transfer of Care (Signed)
Immediate Anesthesia Transfer of Care Note  [name]  Procedure(s) Performed: Procedure(s) with comments: ROBOTIC ASSISTED TOTAL HYSTERECTOMY WITH BILATERAL SALPINGECTOMY (Bilateral) - 3 hrs.  Patient Location: PACU  Anesthesia Type:General  Level of Consciousness: awake  Airway & Oxygen Therapy: Patient Spontanous Breathing  Post-op Assessment: Report given to PACU RN  Post vital signs: stable  Filed Vitals:   09/29/12 0634  BP: 132/89  Temp: 36.9 C  Resp: 18    Complications: No apparent anesthesia complications

## 2012-09-29 NOTE — Anesthesia Postprocedure Evaluation (Signed)
  Anesthesia Post-op Note  [name]  Procedure(s) Performed: Procedure(s) with comments: ROBOTIC ASSISTED TOTAL HYSTERECTOMY WITH BILATERAL SALPINGECTOMY (Bilateral) - 3 hrs.  Patient Location: PACU  Anesthesia Type:General  Level of Consciousness: awake, alert  and oriented  Airway and Oxygen Therapy: Patient Spontanous Breathing  Post-op Pain: mild  Post-op Assessment: Post-op Vital signs reviewed, Patient's Cardiovascular Status Stable, Respiratory Function Stable, Patent Airway, No signs of Nausea or vomiting and Pain level controlled  Post-op Vital Signs: Reviewed and stable  Complications: No apparent anesthesia complications

## 2012-09-29 NOTE — Anesthesia Postprocedure Evaluation (Signed)
Anesthesia Post Note  [name]  Procedure(s) Performed: Procedure(s) with comments: ROBOTIC ASSISTED TOTAL HYSTERECTOMY WITH BILATERAL SALPINGECTOMY (Bilateral) - 3 hrs.  Anesthesia type: General  Patient location: [facility] Unit  Post pain: Pain level controlled  Post assessment: Post-op Vital signs reviewed  Last Vitals: BP 125/73  Pulse 91  Temp(Src) 36.8 C (Oral)  Resp 16  Ht 5\' 3"  (1.6 m)  Wt 160 lb (72.576 kg)  BMI 28.35 kg/m2  SpO2 100%  Post vital signs: Reviewed  Level of consciousness: awake  Complications: No apparent anesthesia complications

## 2012-09-29 NOTE — Brief Op Note (Signed)
09/29/2012  11:10 AM  PATIENT:  [name]  47 y.o. female  PRE-OPERATIVE DIAGNOSIS:  Right Hydrosalpinx, Menorrhagia, Fibroids    POST-OPERATIVE DIAGNOSIS:  Menorrhagia,fibroids, dysmenorrhea  PROCEDURE:  DaVinci robotic total hysterectomy, bilateral salpingectomy, LOA  SURGEON:  Surgeon(s) and Role:    * Consetta Cosner Cathie Beams, MD - Primary  PHYSICIAN ASSISTANT:   ASSISTANTS: Marlinda Mike, CNM   ANESTHESIA:   general Findings: nl tubes and ovaries, adherent bladder, fibroid uterus EBL:  Total I/O In: 2300 [I.V.:2300] Out: 450 [Urine:400; Blood:50]  BLOOD ADMINISTERED:none  DRAINS: none   LOCAL MEDICATIONS USED:  MARCAINE     SPECIMEN:  Source of Specimen:  uterus, cervix, tubes  DISPOSITION OF SPECIMEN:  PATHOLOGY  COUNTS:  YES  TOURNIQUET:  * No tourniquets in log *  DICTATION: .Other Dictation: Dictation Number [id number]  PLAN OF CARE: Admit for overnight observation  PATIENT DISPOSITION:  PACU - hemodynamically stable.   Delay start of Pharmacological VTE agent (>24hrs) due to surgical blood loss or risk of bleeding: no

## 2012-09-30 ENCOUNTER — Encounter (HOSPITAL_COMMUNITY): Payer: Self-pay | Admitting: Obstetrics and Gynecology

## 2012-09-30 NOTE — Op Note (Signed)
[name], [name] NO.:  [id number]  MEDICAL RECORD NO.:  [id number]  LOCATION:  9317                          FACILITY:  [facility]  PHYSICIAN:  Maxie Better, M.D.DATE OF BIRTH:  03-21-1965  DATE OF PROCEDURE:  09/29/2012 DATE OF DISCHARGE:  09/29/2012                              OPERATIVE REPORT   PREOPERATIVE DIAGNOSES:  Right hydrosalpinx, menorrhagia, dysmenorrhea, uterine fibroids.  PROCEDURES:  Da Vinci robotic-assisted total hysterectomy, bilateral salpingectomy, lysis of adhesions.  POSTOPERATIVE DIAGNOSES:  Menorrhagia, uterine fibroids, pelvic adhesions.  ANESTHESIA:  General.  SURGEON:  Maxie Better, MD.  ASSISTANTMarlinda Mike, C.N.M.  DESCRIPTION OF PROCEDURE:  Under adequate general anesthesia, the patient was positioned for robotic surgery.  She was in the dorsal lithotomy position.  Examination under anesthesia revealed about 8-10 weeks size anteverted uterus.  No adnexal masses could be appreciated. The patient was sterilely prepped and draped in usual fashion.  A weighted speculum was placed in the vagina.  Sims retractor was placed anteriorly.  The cervix had two 0-Vicryl figure-of-eight sutures placed on the anterior and posterior lip of the cervix.  The uterus initially sounded to 8 cm.  A 3-way indwelling Foley catheter was sterilely placed.  A small size RUMI cup attached to the #8 uterine manipulator was inserted into the uterine cavity.  However, the balloon ruptured x2 with inflation leading to a #10 uterine manipulator being inserted with good positioning.  At that point, the weighted speculum was removed from the vagina.  Attention was then turned to the abdomen.  A supraumbilical incision was made.  Veress needle was introduced.  Three liters of CO2 was insufflated.  Opening pressure was 8-10.  Veress needle was then removed.  A 12-mm disposable trocar with sleeve was introduced into the abdomen without any  incident.  The robotic camera was then inserted through that port.  At that point, it was then noted that entry into the abdomen was without incident.  She had surgical absence of her gallbladder.  The liver was normal.  Uterus was slightly enlarged and irregular.  There was a single omental adhesion to the right anterior abdominal wall.  Both fallopian tubes appeared normal.  The ovaries were normal.  The ureter was seen peristalsing on both sides.  The left was less visible than the right.  There was evidence of bladder adhesions anteriorly.  Two robotic ports sites were placed on the left and one on the right and a 5 mm assistant port was placed on the right side, all under direct visualization.  The robot was then docked to the left of the bed and the robotic instruments were placed as follows:  In arm #1, the monopolar scissors; arm #2, the PK dissector; arm #3, the Prograsp.  I then went to the surgical console.  The pelvis was further inspected. The right fallopian tube was grasped.  The mesosalpinx was serially clamped, cauterized, and then cut.  The retroperitoneal space on the right was opened.  The right utero-ovarian ligament was clamped, cauterized, and cut.  Anteriorly, the vesicouterine peritoneum was opened transversely; however, it was noted that the bladder was densely adherent to the lower  uterine segment.  At that point, some dissection was done laterally.  The bladder was then retrograde filled with 200 mL normal saline.  I went to the opposite side in the interim where there were adhesions of the bowels to the pelvis which was needed to be lysed and which was done.  The ureter was finally seen.  The retroperitoneal space on the left was opened.  The fallopian tube was grasped and underlying mesosalpinx was serially clamped, cauterized, and cut.  The left uterosacral ligament was then serially clamped, cauterized, and ultimately cut.  The remaining attachment of the  bladder was sharply dissected.  The uterine vessels on the left was skeletonized but not cauterized.  Attention was then turned back to the right.  The uterine vessels were skeletonized as well.  The bladder was firmly adherent to the lower uterine segment.  Sharp dissection was initially performed. The bladder was then emptied of the retrograde filling. Using the third arm, the bladder was placed superiorly in order to continue the dissection.  This was continued particularly on the patient's right side.  I was able to find some plain where the bladder had been tacked onto the uterus. Dissection occurred on both sides of the bladder. Scar tissue was continued to be noted.  Sharp and blunt dissection was utilized until the bladder came off the lower uterine segment. The bladder was then retrograde filled with 300 mL of methylene dyed fluid to assess integrity of the bladder.  The uterine vessels were then subsequently further skeletonized, clamped, cauterized, and then cut.  On the left side, the same procedure was performed.  Once this was done, the abdomen was irrigated for more exposure.  The posterior base of the dissection occurred and nonetheless, attention was then turned back to the bladder where the bladder adhesions were removed.  The bladder was then emptied. The bladder was further dissected off the lower uterine segment. Once this was completed, cervicovaginal junction was circumferentially opened and the uterus with both fallopian tubes  were severed from its vaginal attachment and then brought through the vagina and, sent to Pathology.  The vaginal cuff had some bleeders which were cauterized.  Two 6-inch 0 V Lock sutures were dropped through the umbilical port site  and the vaginal cuff was then closed with 2 layers closure using the0 V- Loc suture.   Starting from each angle, 0 V-Loc suture was then placed in a running stitch to reach the midline and then reversed with good  hemostasis noted at that point. Digital palpation of the vaginal cuff showed good approximation. The abdomen was then copiously irrigated and suctioned with good hemostasis noted.  The procedure was terminated at that point.  The instruments from the robot were removed.  The robot was then undocked.  Once this was done, I went back to the patient's bedside. The Needle x 2 were removed.  Surgicel was placed overlying the vaginal cuff.  Robotic ports were then removed under direct visualization.  Once this was done, the skin incision was approximated with 4-0 Vicryl suture and the fascia in the supraumbilical site with 0 Vicryl figure-of-eight sutures.  SPECIMEN:  Uterus and cervix with tubes sent to Pathology.  ESTIMATED BLOOD LOSS:  50 mL.  INTRAOPERATIVE FLUID:  2 L.  URINE OUTPUT:  400 mL.  COUNTS:  Sponge and instrument counts x2 was correct.  COMPLICATIONS:  None.  CONDITION:  The patient tolerated the procedure well and was transferred to recovery in stable condition.  Maxie Better, M.D.    [place]/MEDQ  D:  09/29/2012  T:  09/30/2012  Job:  [id number]

## 2016-10-24 LAB — HM COLONOSCOPY

## 2017-07-31 DIAGNOSIS — E7800 Pure hypercholesterolemia, unspecified: Secondary | ICD-10-CM | POA: Diagnosis not present

## 2017-07-31 DIAGNOSIS — E559 Vitamin D deficiency, unspecified: Secondary | ICD-10-CM | POA: Diagnosis not present

## 2017-07-31 DIAGNOSIS — E039 Hypothyroidism, unspecified: Secondary | ICD-10-CM | POA: Diagnosis not present

## 2017-07-31 DIAGNOSIS — Z0000 Encounter for general adult medical examination without abnormal findings: Secondary | ICD-10-CM | POA: Diagnosis not present

## 2017-08-05 DIAGNOSIS — E039 Hypothyroidism, unspecified: Secondary | ICD-10-CM | POA: Diagnosis not present

## 2017-08-05 DIAGNOSIS — E559 Vitamin D deficiency, unspecified: Secondary | ICD-10-CM | POA: Diagnosis not present

## 2017-08-05 DIAGNOSIS — E7800 Pure hypercholesterolemia, unspecified: Secondary | ICD-10-CM | POA: Diagnosis not present

## 2017-08-05 DIAGNOSIS — Z0000 Encounter for general adult medical examination without abnormal findings: Secondary | ICD-10-CM | POA: Diagnosis not present

## 2017-08-06 ENCOUNTER — Ambulatory Visit
Admission: RE | Admit: 2017-08-06 | Discharge: 2017-08-06 | Disposition: A | Discharge status: Home / Self Care | Payer: BLUE CROSS/BLUE SHIELD | Source: Ambulatory Visit | Attending: Family Medicine | Admitting: Family Medicine

## 2017-08-06 ENCOUNTER — Encounter: Payer: Self-pay | Admitting: Family Medicine

## 2017-08-06 ENCOUNTER — Ambulatory Visit: Payer: BLUE CROSS/BLUE SHIELD | Admitting: Family Medicine

## 2017-08-06 VITALS — BP 138/90 | Ht 63.0 in | Wt 176.0 lb

## 2017-08-06 DIAGNOSIS — M1612 Unilateral primary osteoarthritis, left hip: Secondary | ICD-10-CM | POA: Diagnosis not present

## 2017-08-06 DIAGNOSIS — M25552 Pain in left hip: Secondary | ICD-10-CM | POA: Insufficient documentation

## 2017-08-06 MED ORDER — MELOXICAM 15 MG PO TABS: 15.0000 mg | ORAL_TABLET | Freq: Every day | ORAL | 2 refills | Status: DC

## 2017-08-06 NOTE — Progress Notes (Signed)
HPI  CC: Left hip pain Patient is here with complaints of atraumatic left-sided hip pain.  She states that this is been ongoing over the past 5 to 6 months.  Pain is localized to the anterior lateral aspect of her left hip with some occasional radiation down the anterior aspect of her thigh, never as far as the knee.  She denies any trauma or injury.  She states that pain seem to have begun around the time that she was spending helping to clean her parents house out which involves going up and down numerous stairs.  Pain is exacerbated with some twisting motions.  She denies any weakness, numbness, or paresthesias.  No back pain.  Traumatic: No   Quality: Aching and occasionally sharp   Timing: With use, especially stairs  Improving/Worsening: Unchanged Makes better: Rest Makes worse: Prolonged activity and stairs Associated symptoms: None  Previous Interventions Tried: Ibuprofen with mild improvement  Past Injuries: Noncontributory Past Surgeries: Noncontributory Smoking: Current smoker Family Hx: Noncontributory  ROS: Per HPI; in addition no fever, no rash, no additional weakness, no additional numbness, no additional paresthesias, and no additional falls/injury.   Objective: BP 138/90   Ht 5\' 3"  (1.6 m)   Wt 176 lb (79.8 kg)   BMI 31.18 kg/m  Gen: NAD, well groomed, a/o x3, normal affect.  CV: Well-perfused. Warm.  Resp: Non-labored.  Neuro: Sensation intact throughout. No gross coordination deficits.  Gait: Nonpathologic posture, unremarkable stride without signs of limp or balance issues. Hip, Left: TTP noted at the anterior lateral aspect of the hip, inferior to the ASIS, near the AIIS. No obvious rash, erythema, ecchymosis, or edema. ROM full in all directions, but with exacerbation of pain with IR and ER while in 90 of hip flexion; Strength 5/5. Pelvic alignment unremarkable to inspection and palpation. Standing hip rotation and gait with trendelenburg on the left side.  Greater trochanter without tenderness to palpation. No tenderness over piriformis. No SI joint tenderness and normal minimal SI movement.   Assessment and Plan:  Left hip pain Patient is here with left-sided hip pain over the past 5 to 6 months likely secondary to relative weakness within the hip abductors and other pelvic stabilizing muscles.  Unable to elicit pain with specific hip flexor strength testing.  No evidence of greater trochanteric bursitis.  Unlikely to be related to lumbar stenosis as patient denies back pain.  No red flag symptoms. -HEP: Focusing on hip abductor strengthening and core. -Encouraged ice and/or heat as needed. -Hip x-ray: Ordered today, will go over results at follow-up visit -Follow-up in 4 weeks  Next: If x-rays show evidence of arthritic changes would consider intra-articular injection.  If symptoms progress to include paresthesias or numbness would consider looking into Dx of meralgia paresthetica.   Orders Placed This Encounter  Procedures  . DG HIP UNILAT W OR W/O PELVIS 2-3 VIEWS LEFT    Standing Status:   Future    Standing Expiration Date:   10/07/2018    Order Specific Question:   Reason for Exam (SYMPTOM  OR DIAGNOSIS REQUIRED)    Answer:   left hip pain    Order Specific Question:   Is patient pregnant?    Answer:   No    Order Specific Question:   Preferred imaging location?    Answer:   [facility]    Order Specific Question:   Radiology Contrast Protocol - do NOT remove file path    Answer:   [email or web address]  Meds ordered this encounter  Medications  . meloxicam (MOBIC) 15 MG tablet    Sig: Take 1 tablet (15 mg total) by mouth daily.    Dispense:  30 tablet    Refill:  2     Elberta Leatherwood, MD,MS [facility] Fellow 08/06/2017 3:35 PM

## 2017-08-06 NOTE — Assessment & Plan Note (Signed)
Patient is here with left-sided hip pain over the past 5 to 6 months likely secondary to relative weakness within the hip abductors and other pelvic stabilizing muscles.  Unable to elicit pain with specific hip flexor strength testing.  No evidence of greater trochanteric bursitis.  Unlikely to be related to lumbar stenosis as patient denies back pain.  No red flag symptoms. -HEP: Focusing on hip abductor strengthening and core. -Encouraged ice and/or heat as needed. -Hip x-ray: Ordered today, will go over results at follow-up visit -Follow-up in 4 weeks  Next: If x-rays show evidence of arthritic changes would consider intra-articular injection.  If symptoms progress to include paresthesias or numbness would consider looking into Dx of meralgia paresthetica.

## 2017-08-20 DIAGNOSIS — L918 Other hypertrophic disorders of the skin: Secondary | ICD-10-CM | POA: Diagnosis not present

## 2017-09-03 ENCOUNTER — Encounter: Payer: Self-pay | Admitting: Family Medicine

## 2017-09-03 ENCOUNTER — Ambulatory Visit: Payer: BLUE CROSS/BLUE SHIELD | Admitting: Family Medicine

## 2017-09-03 DIAGNOSIS — M25552 Pain in left hip: Secondary | ICD-10-CM | POA: Diagnosis not present

## 2017-09-03 NOTE — Assessment & Plan Note (Signed)
Patient is here to follow-up regarding her left-sided hip pain.  Symptoms have dramatically improved since last being seen.  She endorses compliance with HEP and Mobic.  Physical exam yielded improved TTP and abduction hip strength. -Continue Mobic as needed -Continue HEP >> encouraged at least 3x/week -Follow-up PRN

## 2017-09-03 NOTE — Progress Notes (Signed)
   HPI  CC: Follow-up left hip pain Patient is here to follow-up regarding her left-sided hip pain.  She states that she has had significant improvement with her anterior lateral left-sided hip pain.  She endorses good compliance with the home exercise regimen provided at the last visit.  She also endorses good compliance with the meloxicam prescription.  She states that she had one very minor setback a little over a week ago when she went to the beach.  That afternoon and the following day she had some increased soreness but this quickly resolved.  She states that she is "at least 80% better".  She is pleased with this improvement and would like to continue with the current treatment plan.  She denies any weakness, numbness, or paresthesias.  No new injuries, trauma, or falls since last being seen.  Medications/Interventions Tried: HEP, Mobic, relative rest.  See HPI and/or previous note for associated ROS.  Objective: BP 136/81   Ht 5\' 3"  (1.6 m)   Wt 176 lb (79.8 kg)   BMI 31.18 kg/m  Gen: NAD, well groomed, a/o x3, normal affect.  CV: Well-perfused. Warm.  Resp: Non-labored.  Neuro: Sensation intact throughout. No gross coordination deficits.  Gait: Nonpathologic posture, unremarkable stride without signs of limp or balance issues. Hip, Left: Significantly improved TTP along the anterior lateral aspect of the hip, near the AIIS. ROM full in all directions, some pain with IR and ER while in 90 of hip flexion (improved from previous); Strength 5/5 >> abduction strength significantly improved. Pelvic alignment unremarkable to inspection and palpation. Trendelenburg no longer present on the left side. Greater trochanter without tenderness to palpation. No tenderness over piriformis. No SI joint tenderness and normal minimal SI movement.   Assessment and plan:  Left hip pain Patient is here to follow-up regarding her left-sided hip pain.  Symptoms have dramatically improved since last  being seen.  She endorses compliance with HEP and Mobic.  Physical exam yielded improved TTP and abduction hip strength. -Continue Mobic as needed -Continue HEP >> encouraged at least 3x/week -Follow-up PRN   Elberta Leatherwood, MD,MS [facility] Fellow 09/03/2017 4:47 PM

## 2017-09-26 DIAGNOSIS — K5730 Diverticulosis of large intestine without perforation or abscess without bleeding: Secondary | ICD-10-CM | POA: Diagnosis not present

## 2017-09-26 DIAGNOSIS — K219 Gastro-esophageal reflux disease without esophagitis: Secondary | ICD-10-CM | POA: Diagnosis not present

## 2017-09-26 DIAGNOSIS — E7800 Pure hypercholesterolemia, unspecified: Secondary | ICD-10-CM | POA: Diagnosis not present

## 2017-09-30 DIAGNOSIS — K5732 Diverticulitis of large intestine without perforation or abscess without bleeding: Secondary | ICD-10-CM | POA: Diagnosis not present

## 2017-09-30 DIAGNOSIS — B370 Candidal stomatitis: Secondary | ICD-10-CM | POA: Diagnosis not present

## 2017-12-19 DIAGNOSIS — Z1231 Encounter for screening mammogram for malignant neoplasm of breast: Secondary | ICD-10-CM | POA: Diagnosis not present

## 2017-12-19 DIAGNOSIS — Z803 Family history of malignant neoplasm of breast: Secondary | ICD-10-CM | POA: Diagnosis not present

## 2018-08-06 DIAGNOSIS — E559 Vitamin D deficiency, unspecified: Secondary | ICD-10-CM | POA: Diagnosis not present

## 2018-08-06 DIAGNOSIS — E039 Hypothyroidism, unspecified: Secondary | ICD-10-CM | POA: Diagnosis not present

## 2018-08-06 DIAGNOSIS — Z0000 Encounter for general adult medical examination without abnormal findings: Secondary | ICD-10-CM | POA: Diagnosis not present

## 2018-08-06 DIAGNOSIS — E7800 Pure hypercholesterolemia, unspecified: Secondary | ICD-10-CM | POA: Diagnosis not present

## 2018-08-06 DIAGNOSIS — N390 Urinary tract infection, site not specified: Secondary | ICD-10-CM | POA: Diagnosis not present

## 2018-08-11 DIAGNOSIS — N390 Urinary tract infection, site not specified: Secondary | ICD-10-CM | POA: Diagnosis not present

## 2018-08-11 DIAGNOSIS — Z0000 Encounter for general adult medical examination without abnormal findings: Secondary | ICD-10-CM | POA: Diagnosis not present

## 2018-10-13 DIAGNOSIS — E7800 Pure hypercholesterolemia, unspecified: Secondary | ICD-10-CM | POA: Diagnosis not present

## 2018-10-13 DIAGNOSIS — E039 Hypothyroidism, unspecified: Secondary | ICD-10-CM | POA: Diagnosis not present

## 2018-10-13 DIAGNOSIS — Z0000 Encounter for general adult medical examination without abnormal findings: Secondary | ICD-10-CM | POA: Diagnosis not present

## 2019-01-01 DIAGNOSIS — Z1231 Encounter for screening mammogram for malignant neoplasm of breast: Secondary | ICD-10-CM | POA: Diagnosis not present

## 2019-09-01 ENCOUNTER — Ambulatory Visit: Payer: No Typology Code available for payment source | Admitting: Physician Assistant

## 2019-09-30 ENCOUNTER — Ambulatory Visit (INDEPENDENT_AMBULATORY_CARE_PROVIDER_SITE_OTHER): Payer: No Typology Code available for payment source | Admitting: Physician Assistant

## 2019-09-30 ENCOUNTER — Encounter: Payer: Self-pay | Admitting: Physician Assistant

## 2019-09-30 ENCOUNTER — Other Ambulatory Visit: Payer: Self-pay

## 2019-09-30 VITALS — BP 140/81 | HR 77 | Temp 97.8°F | Ht 63.0 in | Wt 172.9 lb

## 2019-09-30 DIAGNOSIS — Z0000 Encounter for general adult medical examination without abnormal findings: Secondary | ICD-10-CM

## 2019-09-30 DIAGNOSIS — Z7689 Persons encountering health services in other specified circumstances: Secondary | ICD-10-CM

## 2019-09-30 DIAGNOSIS — R030 Elevated blood-pressure reading, without diagnosis of hypertension: Secondary | ICD-10-CM | POA: Diagnosis not present

## 2019-09-30 DIAGNOSIS — R351 Nocturia: Secondary | ICD-10-CM

## 2019-09-30 DIAGNOSIS — R232 Flushing: Secondary | ICD-10-CM | POA: Diagnosis not present

## 2019-09-30 DIAGNOSIS — M2550 Pain in unspecified joint: Secondary | ICD-10-CM

## 2019-09-30 DIAGNOSIS — E039 Hypothyroidism, unspecified: Secondary | ICD-10-CM | POA: Insufficient documentation

## 2019-09-30 DIAGNOSIS — R12 Heartburn: Secondary | ICD-10-CM | POA: Insufficient documentation

## 2019-09-30 NOTE — Assessment & Plan Note (Signed)
-  Continue levothyroxine 75 mcg. -Plan to check TSH with CPE. -Will continue to monitor.

## 2019-09-30 NOTE — Progress Notes (Signed)
New Patient Office Visit  Subjective:  Patient ID: [name], female    DOB: [birth year]  Age: 54 y.o. MRN: [id number]  CC:  Chief Complaint  Patient presents with  . New Patient (Initial Visit)    HPI [name] presents for to establish care.  Patient is transferring care from [place].  Her concerns today are multiple joint pain, possible menopause symptoms, and heartburn.  Reports bilateral knee pain which tends to onset with going up steps/stairs, joint pain at night mostly in her fingers, and hip pain. 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 Denies crepitus, erythema or swelling.  Patient has been taking ibuprofen which has provided some pain relief. States her heartburn symptoms have been more frequent, and she does take omeprazole 20 mg.  Also reports she currently goes to [facility] but wishes to transfer to another location. States she is having hot flashes usually at night and nocturia and would like to be evaluated for menopause. Patient has history of hypothyroidism and is currently taking Synthroid 75 mcg, vitamin D deficiency for which she takes 1000 units 3 times a day of vitamin D3. Patient has family history of breast cancer and she has been closely monitored due to a breast cyst, and is due for mammogram until the fall. Denies any breast changes.  Family history is also pertinent for diabetes, heart attack, and stroke. Patient denies prior history of diabetes, hypertension, or hyperlipidemia.  Past Medical History:  Diagnosis Date  . Generalized headaches   . Thyroid disease    hypo    Past Surgical History:  Procedure Laterality Date  . ABDOMINAL HYSTERECTOMY N/A    Phreesia 08/30/2019  . APPENDECTOMY  2003  . CESAREAN SECTION  2005  . CESAREAN SECTION N/A    Phreesia 08/30/2019  . CHOLECYSTECTOMY    . ROBOTIC ASSISTED TOTAL HYSTERECTOMY Bilateral 09/29/2012   Procedure: ROBOTIC ASSISTED TOTAL HYSTERECTOMY WITH BILATERAL SALPINGECTOMY;  Surgeon: Marvene Staff, MD;  Location: [facility];  Service: Gynecology;  Laterality: Bilateral;  3 hrs.  . TONSILLECTOMY      Family History  Problem Relation Age of Onset  . Stroke Father   . Transient ischemic attack Father   . Heart disease Mother   . COPD Mother   . Cancer Mother   . Breast cancer Mother   . Heart attack Mother   . Diabetes Mother   . Breast cancer Maternal Aunt   . Breast cancer Maternal Grandmother   . Diabetes Paternal Grandmother     Social History   Socioeconomic History  . Marital status: Married    Spouse name: Jewel Baize  . Number of children: 1  . Years of education: Not on file  . Highest education level: Bachelor's degree (e.g., BA, AB, BS)  Occupational History  . Occupation: document Games developer: [employer]  Tobacco Use  . Smoking status: Former Smoker    Packs/day: 0.25    Types: Cigarettes    Quit date: 03/11/2013    Years since quitting: 6.5  . Smokeless tobacco: Never Used  Vaping Use  . Vaping Use: Never used  Substance and Sexual Activity  . Alcohol use: Yes    Comment: rare  . Drug use: No  . Sexual activity: Yes    Birth control/protection: None  Other Topics Concern  . Not on file  Social History Narrative  . Not on file   Social Determinants of Health   Financial Resource  Strain:   . Difficulty of Paying Living Expenses:   Food Insecurity:   . Worried About Charity fundraiser in the Last Year:   . Arboriculturist in the Last Year:   Transportation Needs:   . Film/video editor (Medical):   Marland Kitchen Lack of Transportation (Non-Medical):   Physical Activity:   . Days of Exercise per Week:   . Minutes of Exercise per Session:   Stress:   . Feeling of Stress :   Social Connections:   . Frequency of Communication with Friends and Family:   . Frequency of Social Gatherings with Friends and Family:   . Attends Religious Services:   . Active Member of Clubs or Organizations:   . Attends Archivist Meetings:    Marland Kitchen Marital Status:   Intimate Partner Violence:   . Fear of Current or Ex-Partner:   . Emotionally Abused:   Marland Kitchen Physically Abused:   . Sexually Abused:     ROS Review of Systems Review of Systems:  A fourteen system review of systems was performed and found to be positive as per HPI.  Objective:   Today's Vitals: BP (!) 140/81   Pulse 77   Temp 97.8 F (36.6 C) (Oral)   Ht 5\' 3"  (1.6 m)   Wt 172 lb 14.4 oz (78.4 kg)   SpO2 98%   BMI 30.63 kg/m   Physical Exam General:  Well Developed, well nourished, appropriate for stated age.  Neuro:  Alert and oriented,  extra-ocular muscles intact  HEENT:  Normocephalic, atraumatic, neck supple, no carotid bruits appreciated  Skin:  no gross rash, warm, pink. Cardiac:  RRR, S1 S2 Respiratory:  ECTA B/L and A/P, Not using accessory muscles, speaking in full sentences- unlabored. MSK: Good ROM, good strength, normal gait, no bony abnormalities, no joint swelling Vascular:  Ext warm, no cyanosis apprec.; cap RF less 2 sec. no gross edema Psych:  No HI/SI, judgement and insight good, Euthymic mood. Full Affect.   Assessment & Plan:   Problem List Items Addressed This Visit      Endocrine   Hypothyroidism    -Continue levothyroxine 75 mcg. -Plan to check TSH with CPE. -Will continue to monitor.      Relevant Medications   SYNTHROID 75 MCG tablet     Other   Heartburn    -Advised to reduce ibuprofen use to help improve heartburn symptoms. -Continue omeprazole 20 mg as needed. -Will continue to monitor.       Other Visit Diagnoses    Encounter to establish care    -  Primary   Elevated blood pressure reading without diagnosis of hypertension       Arthralgia, unspecified joint       Hot flashes       Healthcare maintenance       Nocturia         Arthralgias: -Recommend to use topical anti-inflammatory such as Voltaren gel. -Alternate between Tylenol and Ibuprofen to reduce frequent ibuprofen use. -If symptoms  worsen or fail to improve recommend imaging. Patient verbalized understanding.  Hot flashes, nocturia: -Patient plans to switch OB/GYN and will let me know if she needs referral to new location. -Symptoms could be related to menopause. Recommend to avoid drinking few hours before bedtime and [place] exercises.  Healthcare maintenance: -No records to review.  Will request if not already. -UTD on colonoscopy, performed in 2018 and advised to repeat in 10 years (per pt) -UTD on  mammogram, due in the fall. -Follow heart healthy diet and continue daily walks of 30 minutes. -Stay well hydrated, at least 64 fl oz -Recommended to purchase blood pressure upper arm cuff to monitor BP and pulse at home, and should keep a log. -Follow-up for CPE and FBW in 2 to 3 months.  Outpatient Encounter Medications as of 09/30/2019  Medication Sig  . acetaminophen (TYLENOL) 500 MG tablet Take 500 mg by mouth every 6 (six) hours as needed.  . Cholecalciferol (VITAMIN D3) 3000 UNITS TABS Take 3,000 Units by mouth daily.    Marland Kitchen ibuprofen (ADVIL) 200 MG tablet Take 200 mg by mouth every 6 (six) hours as needed.  . Multiple Vitamin (MULTIVITAMIN PO) Take 1 tablet by mouth daily.   Marland Kitchen omeprazole (PRILOSEC) 20 MG capsule Take 20 mg by mouth daily.  Marland Kitchen SYNTHROID 75 MCG tablet Take 75 mcg by mouth daily.  . Calcium 500 MG tablet Take 500 mg by mouth daily. (Patient not taking: Reported on 09/30/2019)  . levothyroxine (SYNTHROID, LEVOTHROID) 50 MCG tablet Take 75 mcg by mouth daily.  (Patient not taking: Reported on 09/30/2019)  . meloxicam (MOBIC) 15 MG tablet Take 1 tablet (15 mg total) by mouth daily. (Patient not taking: Reported on 09/30/2019)   No facility-administered encounter medications on file as of 09/30/2019.    Follow-up: Return for CPE and FBW few days prior to OV in 2-3 months.   Note:  This note was prepared with assistance of Dragon voice recognition software. Occasional wrong-word or sound-a-like  substitutions may have occurred due to the inherent limitations of voice recognition software.  Lorrene Reid, PA-C

## 2019-09-30 NOTE — Patient Instructions (Signed)
Menopause Menopause is the normal time of life when menstrual periods stop completely. It is usually confirmed by 12 months without a menstrual period. The transition to menopause (perimenopause) most often happens between the ages of 45 and 55. During perimenopause, hormone levels change in your body, which can cause symptoms and affect your health. Menopause may increase your risk for:  Loss of bone (osteoporosis), which causes bone breaks (fractures).  Depression.  Hardening and narrowing of the arteries (atherosclerosis), which can cause heart attacks and strokes. What are the causes? This condition is usually caused by a natural change in hormone levels that happens as you get older. The condition may also be caused by surgery to remove both ovaries (bilateral oophorectomy). What increases the risk? This condition is more likely to start at an earlier age if you have certain medical conditions or treatments, including:  A tumor of the pituitary gland in the brain.  A disease that affects the ovaries and hormone production.  Radiation treatment for cancer.  Certain cancer treatments, such as chemotherapy or hormone (anti-estrogen) therapy.  Heavy smoking and excessive alcohol use.  Family history of early menopause. This condition is also more likely to develop earlier in women who are very thin. What are the signs or symptoms? Symptoms of this condition include:  Hot flashes.  Irregular menstrual periods.  Night sweats.  Changes in feelings about sex. This could be a decrease in sex drive or an increased comfort around your sexuality.  Vaginal dryness and thinning of the vaginal walls. This may cause painful intercourse.  Dryness of the skin and development of wrinkles.  Headaches.  Problems sleeping (insomnia).  Mood swings or irritability.  Memory problems.  Weight gain.  Hair growth on the face and chest.  Bladder infections or problems with urinating. How  is this diagnosed? This condition is diagnosed based on your medical history, a physical exam, your age, your menstrual history, and your symptoms. Hormone tests may also be done. How is this treated? In some cases, no treatment is needed. You and your health care provider should make a decision together about whether treatment is necessary. Treatment will be based on your individual condition and preferences. Treatment for this condition focuses on managing symptoms. Treatment may include:  Menopausal hormone therapy (MHT).  Medicines to treat specific symptoms or complications.  Acupuncture.  Vitamin or herbal supplements. Before starting treatment, make sure to let your health care provider know if you have a personal or family history of:  Heart disease.  Breast cancer.  Blood clots.  Diabetes.  Osteoporosis. Follow these instructions at home: Lifestyle  Do not use any products that contain nicotine or tobacco, such as cigarettes and e-cigarettes. If you need help quitting, ask your health care provider.  Get at least 30 minutes of physical activity on 5 or more days each week.  Avoid alcoholic and caffeinated beverages, as well as spicy foods. This may help prevent hot flashes.  Get 7-8 hours of sleep each night.  If you have hot flashes, try: ? Dressing in layers. ? Avoiding things that may trigger hot flashes, such as spicy food, warm places, or stress. ? Taking slow, deep breaths when a hot flash starts. ? Keeping a fan in your home and office.  Find ways to manage stress, such as deep breathing, meditation, or journaling.  Consider going to group therapy with other women who are having menopause symptoms. Ask your health care provider about recommended group therapy meetings. Eating and   drinking  Eat a healthy, balanced diet that contains whole grains, lean protein, low-fat dairy, and plenty of fruits and vegetables.  Your health care provider may recommend  adding more soy to your diet. Foods that contain soy include tofu, tempeh, and soy milk.  Eat plenty of foods that contain calcium and vitamin D for bone health. Items that are rich in calcium include low-fat milk, yogurt, beans, almonds, sardines, broccoli, and kale. Medicines  Take over-the-counter and prescription medicines only as told by your health care provider.  Talk with your health care provider before starting any herbal supplements. If prescribed, take vitamins and supplements as told by your health care provider. These may include: ? Calcium. Women age 45 and older should get 1,200 mg (milligrams) of calcium every day. ? Vitamin D. Women need 600-800 International Units of vitamin D each day. ? Vitamins B12 and B6. Aim for 50 micrograms of B12 and 1.5 mg of B6 each day. General instructions  Keep track of your menstrual periods, including: ? When they occur. ? How heavy they are and how long they last. ? How much time passes between periods.  Keep track of your symptoms, noting when they start, how often you have them, and how long they last.  Use vaginal lubricants or moisturizers to help with vaginal dryness and improve comfort during sex.  Keep all follow-up visits as told by your health care provider. This is important. This includes any group therapy or counseling. Contact a health care provider if:  You are still having menstrual periods after age 48.  You have pain during sex.  You have not had a period for 12 months and you develop vaginal bleeding. Get help right away if:  You have: ? Severe depression. ? Excessive vaginal bleeding. ? Pain when you urinate. ? A fast or irregular heart beat (palpitations). ? Severe headaches. ? Abdomen (abdominal) pain or severe indigestion.  You fell and you think you have a broken bone.  You develop leg or chest pain.  You develop vision problems.  You feel a lump in your breast. Summary  Menopause is the normal  time of life when menstrual periods stop completely. It is usually confirmed by 12 months without a menstrual period.  The transition to menopause (perimenopause) most often happens between the ages of 49 and 64.  Symptoms can be managed through medicines, lifestyle changes, and complementary therapies such as acupuncture.  Eat a balanced diet that is rich in nutrients to promote bone health and heart health and to manage symptoms during menopause. This information is not intended to replace advice given to you by your health care provider. Make sure you discuss any questions you have with your health care provider. Document Revised: 02/07/2017 Document Reviewed: 03/30/2016 Elsevier Patient Education  2020 [employer]. Osteoarthritis  Osteoarthritis is a type of arthritis that affects tissue that covers the ends of bones in joints (cartilage). Cartilage acts as a cushion between the bones and helps them move smoothly. Osteoarthritis results when cartilage in the joints gets worn down. Osteoarthritis is sometimes called "wear and tear" arthritis. Osteoarthritis is the most common form of arthritis. It often occurs in older people. It is a condition that gets worse over time (a progressive condition). Joints that are most often affected by this condition are in:  Fingers.  Toes.  Hips.  Knees.  Spine, including neck and lower back. What are the causes? This condition is caused by age-related wearing down of cartilage that  covers the ends of bones. What increases the risk? The following factors may make you more likely to develop this condition:  Older age.  Being overweight or obese.  Overuse of joints, such as in athletes.  Past injury of a joint.  Past surgery on a joint.  Family history of osteoarthritis. What are the signs or symptoms? The main symptoms of this condition are pain, swelling, and stiffness in the joint. The joint may lose its shape over time. Small pieces  of bone or cartilage may break off and float inside of the joint, which may cause more pain and damage to the joint. Small deposits of bone (osteophytes) may grow on the edges of the joint. Other symptoms may include:  A grating or scraping feeling inside the joint when you move it.  Popping or creaking sounds when you move. Symptoms may affect one or more joints. Osteoarthritis in a major joint, such as your knee or hip, can make it painful to walk or exercise. If you have osteoarthritis in your hands, you might not be able to grip items, twist your hand, or control small movements of your hands and fingers (fine motor skills). How is this diagnosed? This condition may be diagnosed based on:  Your medical history.  A physical exam.  Your symptoms.  X-rays of the affected joint(s).  Blood tests to rule out other types of arthritis. How is this treated? There is no cure for this condition, but treatment can help to control pain and improve joint function. Treatment plans may include:  A prescribed exercise program that allows for rest and joint relief. You may work with a physical therapist.  A weight control plan.  Pain relief techniques, such as: ? Applying heat and cold to the joint. ? Electric pulses delivered to nerve endings under the skin (transcutaneous electrical nerve stimulation, or TENS). ? Massage. ? Certain nutritional supplements.  NSAIDs or prescription medicines to help relieve pain.  Medicine to help relieve pain and inflammation (corticosteroids). This can be given by mouth (orally) or as an injection.  Assistive devices, such as a brace, wrap, splint, specialized glove, or cane.  Surgery, such as: ? An osteotomy. This is done to reposition the bones and relieve pain or to remove loose pieces of bone and cartilage. ? Joint replacement surgery. You may need this surgery if you have very bad (advanced) osteoarthritis. Follow these instructions at  home: Activity  Rest your affected joints as directed by your health care provider.  Do not drive or use heavy machinery while taking prescription pain medicine.  Exercise as directed. Your health care provider or physical therapist may recommend specific types of exercise, such as: ? Strengthening exercises. These are done to strengthen the muscles that support joints that are affected by arthritis. They can be performed with weights or with exercise bands to add resistance. ? Aerobic activities. These are exercises, such as brisk walking or water aerobics, that get your heart pumping. ? Range-of-motion activities. These keep your joints easy to move. ? Balance and agility exercises. Managing pain, stiffness, and swelling      If directed, apply heat to the affected area as often as told by your health care provider. Use the heat source that your health care provider recommends, such as a moist heat pack or a heating pad. ? If you have a removable assistive device, remove it as told by your health care provider. ? Place a towel between your skin and the heat source.  If your health care provider tells you to keep the assistive device on while you apply heat, place a towel between the assistive device and the heat source. ? Leave the heat on for 20-30 minutes. ? Remove the heat if your skin turns bright red. This is especially important if you are unable to feel pain, heat, or cold. You may have a greater risk of getting burned.  If directed, put ice on the affected joint: ? If you have a removable assistive device, remove it as told by your health care provider. ? Put ice in a plastic bag. ? Place a towel between your skin and the bag. If your health care provider tells you to keep the assistive device on during icing, place a towel between the assistive device and the bag. ? Leave the ice on for 20 minutes, 2-3 times a day. General instructions  Take over-the-counter and prescription  medicines only as told by your health care provider.  Maintain a healthy weight. Follow instructions from your health care provider for weight control. These may include dietary restrictions.  Do not use any products that contain nicotine or tobacco, such as cigarettes and e-cigarettes. These can delay bone healing. If you need help quitting, ask your health care provider.  Use assistive devices as directed by your health care provider.  Keep all follow-up visits as told by your health care provider. This is important. Where to find more information  [employer] of Arthritis and Musculoskeletal and Skin Diseases: www.niams.[employer].es  [employer] on Aging: http://kim-miller.com/  American College of Rheumatology: www.rheumatology.org Contact a health care provider if:  Your skin turns red.  You develop a rash.  You have pain that gets worse.  You have a fever along with joint or muscle aches. Get help right away if:  You lose a lot of weight.  You suddenly lose your appetite.  You have night sweats. Summary  Osteoarthritis is a type of arthritis that affects tissue covering the ends of bones in joints (cartilage).  This condition is caused by age-related wearing down of cartilage that covers the ends of bones.  The main symptom of this condition is pain, swelling, and stiffness in the joint.  There is no cure for this condition, but treatment can help to control pain and improve joint function. This information is not intended to replace advice given to you by your health care provider. Make sure you discuss any questions you have with your health care provider. Document Revised: 02/07/2017 Document Reviewed: 10/30/2015 Elsevier Patient Education  2020 [employer].

## 2019-09-30 NOTE — Assessment & Plan Note (Signed)
-  Advised to reduce ibuprofen use to help improve heartburn symptoms. -Continue omeprazole 20 mg as needed. -Will continue to monitor.

## 2019-12-27 ENCOUNTER — Other Ambulatory Visit: Payer: Self-pay

## 2019-12-27 DIAGNOSIS — Z0000 Encounter for general adult medical examination without abnormal findings: Secondary | ICD-10-CM

## 2019-12-27 DIAGNOSIS — E039 Hypothyroidism, unspecified: Secondary | ICD-10-CM

## 2019-12-28 ENCOUNTER — Other Ambulatory Visit: Payer: Self-pay

## 2019-12-28 ENCOUNTER — Other Ambulatory Visit: Payer: No Typology Code available for payment source

## 2019-12-28 DIAGNOSIS — E039 Hypothyroidism, unspecified: Secondary | ICD-10-CM

## 2019-12-28 DIAGNOSIS — Z0000 Encounter for general adult medical examination without abnormal findings: Secondary | ICD-10-CM

## 2019-12-29 LAB — LIPID PANEL
Chol/HDL Ratio: 5.7 ratio — ABNORMAL HIGH (ref 0.0–4.4)
Cholesterol, Total: 245 mg/dL — ABNORMAL HIGH (ref 100–199)
HDL: 43 mg/dL (ref >39)
LDL Chol Calc (NIH): 172 mg/dL — ABNORMAL HIGH (ref 0–99)
Triglycerides: 165 mg/dL — ABNORMAL HIGH (ref 0–149)
VLDL Cholesterol Cal: 30 mg/dL (ref 5–40)

## 2019-12-29 LAB — CBC WITH DIFFERENTIAL/PLATELET
Basophils Absolute: 0 10*3/uL (ref 0.0–0.2)
Basos: 0 %
EOS (ABSOLUTE): 0.1 10*3/uL (ref 0.0–0.4)
Eos: 1 %
Hematocrit: 41.8 % (ref 34.0–46.6)
Hemoglobin: 14.2 g/dL (ref 11.1–15.9)
Immature Grans (Abs): 0 10*3/uL (ref 0.0–0.1)
Immature Granulocytes: 0 %
Lymphocytes Absolute: 1.5 10*3/uL (ref 0.7–3.1)
Lymphs: 28 %
MCH: 30.3 pg (ref 26.6–33.0)
MCHC: 34 g/dL (ref 31.5–35.7)
MCV: 89 fL (ref 79–97)
Monocytes Absolute: 0.4 10*3/uL (ref 0.1–0.9)
Monocytes: 8 %
Neutrophils Absolute: 3.5 10*3/uL (ref 1.4–7.0)
Neutrophils: 63 %
Platelets: 198 10*3/uL (ref 150–450)
RBC: 4.69 x10E6/uL (ref 3.77–5.28)
RDW: 12.6 % (ref 11.7–15.4)
WBC: 5.6 10*3/uL (ref 3.4–10.8)

## 2019-12-29 LAB — COMPREHENSIVE METABOLIC PANEL
ALT: 16 IU/L (ref 0–32)
AST: 16 IU/L (ref 0–40)
Albumin/Globulin Ratio: 2.1 (ref 1.2–2.2)
Albumin: 4.7 g/dL (ref 3.8–4.9)
Alkaline Phosphatase: 117 IU/L (ref 44–121)
BUN/Creatinine Ratio: 15 (ref 9–23)
BUN: 12 mg/dL (ref 6–24)
Bilirubin Total: 1.1 mg/dL (ref 0.0–1.2)
CO2: 26 mmol/L (ref 20–29)
Calcium: 9.8 mg/dL (ref 8.7–10.2)
Chloride: 101 mmol/L (ref 96–106)
Creatinine, Ser: 0.82 mg/dL (ref 0.57–1.00)
GFR calc Af Amer: 93 mL/min/{1.73_m2} (ref >59)
GFR calc non Af Amer: 81 mL/min/{1.73_m2} (ref >59)
Globulin, Total: 2.2 g/dL (ref 1.5–4.5)
Glucose: 102 mg/dL — ABNORMAL HIGH (ref 65–99)
Potassium: 4 mmol/L (ref 3.5–5.2)
Sodium: 143 mmol/L (ref 134–144)
Total Protein: 6.9 g/dL (ref 6.0–8.5)

## 2019-12-29 LAB — TSH: TSH: 1.6 u[IU]/mL (ref 0.450–4.500)

## 2019-12-29 LAB — HEMOGLOBIN A1C
Est. average glucose Bld gHb Est-mCnc: 105 mg/dL
Hgb A1c MFr Bld: 5.3 % (ref 4.8–5.6)

## 2019-12-30 ENCOUNTER — Ambulatory Visit (INDEPENDENT_AMBULATORY_CARE_PROVIDER_SITE_OTHER): Payer: No Typology Code available for payment source | Admitting: Physician Assistant

## 2019-12-30 ENCOUNTER — Other Ambulatory Visit: Payer: Self-pay

## 2019-12-30 VITALS — BP 128/80 | HR 85 | Temp 98.7°F | Ht 62.25 in | Wt 181.7 lb

## 2019-12-30 DIAGNOSIS — Z23 Encounter for immunization: Secondary | ICD-10-CM

## 2019-12-30 DIAGNOSIS — Z114 Encounter for screening for human immunodeficiency virus [HIV]: Secondary | ICD-10-CM

## 2019-12-30 DIAGNOSIS — Z1159 Encounter for screening for other viral diseases: Secondary | ICD-10-CM

## 2019-12-30 DIAGNOSIS — Z1231 Encounter for screening mammogram for malignant neoplasm of breast: Secondary | ICD-10-CM

## 2019-12-30 DIAGNOSIS — E039 Hypothyroidism, unspecified: Secondary | ICD-10-CM | POA: Diagnosis not present

## 2019-12-30 DIAGNOSIS — Z0000 Encounter for general adult medical examination without abnormal findings: Secondary | ICD-10-CM

## 2019-12-30 DIAGNOSIS — Z1211 Encounter for screening for malignant neoplasm of colon: Secondary | ICD-10-CM

## 2019-12-30 DIAGNOSIS — E1169 Type 2 diabetes mellitus with other specified complication: Secondary | ICD-10-CM

## 2019-12-30 DIAGNOSIS — E785 Hyperlipidemia, unspecified: Secondary | ICD-10-CM

## 2019-12-30 MED ORDER — ATORVASTATIN CALCIUM 10 MG PO TABS: 10.0000 mg | ORAL_TABLET | Freq: Every day | ORAL | 1 refills | Status: DC

## 2019-12-30 NOTE — Patient Instructions (Signed)
Preventive Care 45-55 Years Old, Female Preventive care refers to visits with your health care provider and lifestyle choices that can promote health and wellness. This includes:  A yearly physical exam. This may also be called an annual well check.  Regular dental visits and eye exams.  Immunizations.  Screening for certain conditions.  Healthy lifestyle choices, such as eating a healthy diet, getting regular exercise, not using drugs or products that contain nicotine and tobacco, and limiting alcohol use. What can I expect for my preventive care visit? Physical exam Your health care provider will check your:  Height and weight. This may be used to calculate body mass index (BMI), which tells if you are at a healthy weight.  Heart rate and blood pressure.  Skin for abnormal spots. Counseling Your health care provider may ask you questions about your:  Alcohol, tobacco, and drug use.  Emotional well-being.  Home and relationship well-being.  Sexual activity.  Eating habits.  Work and work Statistician.  Method of birth control.  Menstrual cycle.  Pregnancy history. What immunizations do I need?  Influenza (flu) vaccine  This is recommended every year. Tetanus, diphtheria, and pertussis (Tdap) vaccine  You may need a Td booster every 10 years. Varicella (chickenpox) vaccine  You may need this if you have not been vaccinated. Zoster (shingles) vaccine  You may need this after age 68. Measles, mumps, and rubella (MMR) vaccine  You may need at least one dose of MMR if you were born in 1957 or later. You may also need a second dose. Pneumococcal conjugate (PCV13) vaccine  You may need this if you have certain conditions and were not previously vaccinated. Pneumococcal polysaccharide (PPSV23) vaccine  You may need one or two doses if you smoke cigarettes or if you have certain conditions. Meningococcal conjugate (MenACWY) vaccine  You may need this if you  have certain conditions. Hepatitis A vaccine  You may need this if you have certain conditions or if you travel or work in places where you may be exposed to hepatitis A. Hepatitis B vaccine  You may need this if you have certain conditions or if you travel or work in places where you may be exposed to hepatitis B. Haemophilus influenzae type b (Hib) vaccine  You may need this if you have certain conditions. Human papillomavirus (HPV) vaccine  If recommended by your health care provider, you may need three doses over 6 months. You may receive vaccines as individual doses or as more than one vaccine together in one shot (combination vaccines). Talk with your health care provider about the risks and benefits of combination vaccines. What tests do I need? Blood tests  Lipid and cholesterol levels. These may be checked every 5 years, or more frequently if you are over 61 years old.  Hepatitis C test.  Hepatitis B test. Screening  Lung cancer screening. You may have this screening every year starting at age 13 if you have a 30-pack-year history of smoking and currently smoke or have quit within the past 15 years.  Colorectal cancer screening. All adults should have this screening starting at age 107 and continuing until age 47. Your health care provider may recommend screening at age 58 if you are at increased risk. You will have tests every 1-10 years, depending on your results and the type of screening test.  Diabetes screening. This is done by checking your blood sugar (glucose) after you have not eaten for a while (fasting). You may have this  done every 1-3 years.  Mammogram. This may be done every 1-2 years. Talk with your health care provider about when you should start having regular mammograms. This may depend on whether you have a family history of breast cancer.  BRCA-related cancer screening. This may be done if you have a family history of breast, ovarian, tubal, or peritoneal  cancers.  Pelvic exam and Pap test. This may be done every 3 years starting at age 23. Starting at age 14, this may be done every 5 years if you have a Pap test in combination with an HPV test. Other tests  Sexually transmitted disease (STD) testing.  Bone density scan. This is done to screen for osteoporosis. You may have this scan if you are at high risk for osteoporosis. Follow these instructions at home: Eating and drinking  Eat a diet that includes fresh fruits and vegetables, whole grains, lean protein, and low-fat dairy.  Take vitamin and mineral supplements as recommended by your health care provider.  Do not drink alcohol if: ? Your health care provider tells you not to drink. ? You are pregnant, may be pregnant, or are planning to become pregnant.  If you drink alcohol: ? Limit how much you have to 0-1 drink a day. ? Be aware of how much alcohol is in your drink. In the [place] one drink equals one 12 oz bottle of beer (355 mL), one 5 oz glass of wine (148 mL), or one 1 oz glass of hard liquor (44 mL). Lifestyle  Take daily care of your teeth and gums.  Stay active. Exercise for at least 30 minutes on 5 or more days each week.  Do not use any products that contain nicotine or tobacco, such as cigarettes, e-cigarettes, and chewing tobacco. If you need help quitting, ask your health care provider.  If you are sexually active, practice safe sex. Use a condom or other form of birth control (contraception) in order to prevent pregnancy and STIs (sexually transmitted infections).  If told by your health care provider, take low-dose aspirin daily starting at age 61. What's next?  Visit your health care provider once a year for a well check visit.  Ask your health care provider how often you should have your eyes and teeth checked.  Stay up to date on all vaccines. This information is not intended to replace advice given to you by your health care provider. Make sure you  discuss any questions you have with your health care provider. Document Revised: 11/06/2017 Document Reviewed: 11/06/2017 Elsevier Patient Education  2020 [employer].

## 2019-12-30 NOTE — Progress Notes (Signed)
Female Physical   Impression and Recommendations:    1. Healthcare maintenance   2. Hypothyroidism, unspecified type   3. Need for influenza vaccination   4. Need for shingles vaccine   5. Encounter for hepatitis C screening test for low risk patient   6. Screening for HIV (human immunodeficiency virus)   7. Encounter for screening mammogram for malignant neoplasm of breast   8. Screening for colon cancer   9. Hyperlipidemia associated with type 2 diabetes mellitus ([place])      1) Anticipatory Guidance: Discussed skin CA prevention and sunscreen when outside along with skin surveillance; eating a balanced and modest diet; physical activity at least 25 minutes per day or minimum of 150 min/ week moderate to intense activity.  2) Immunizations / Screenings / Labs:   All immunizations are up-to-date per recommendations or will be updated today if pt allows.    - Patient understands with dental and vision screens they will schedule independently.  - Obtained CBC, CMP, HgA1c, Lipid panel, and TSH when fasting.  Most labs are essentially within normal limits with the exception of lipid panel.  Total cholesterol, triglycerides, and LDL elevated.  Patient's 10-year ASCVD risk score is 5.9% and reports a family history of heart disease (mother) so recommend starting statin therapy.  Patient is agreeable to Lipitor 10 mg.  The 10-year ASCVD risk score Mikey Bussing DC Brooke Bonito., et al., 2013) is: 5.9%   Values used to calculate the score:     Age: 79 years     Sex: Female     Is Non-Hispanic African American: No     Diabetic: Yes     Tobacco smoker: No     Systolic Blood Pressure: 683 mmHg     Is BP treated: No     HDL Cholesterol: 43 mg/dL     Total Cholesterol: 245 mg/dL -Has mammogram scheduled for January 15, 2020 -Requesting records for colonoscopy. -Agreeable to Shingrix and influenza vaccines today.  3) Weight:  Discussed goal to improve diet habits to improve overall feelings of well being and  objective health data. Improve nutrient density of diet through increasing intake of fruits and vegetables and decreasing saturated fats, white flour products and refined sugars.  4) Healthcare maintenance: -Continue current medication regimen. -Schedule lab visit for fasting blood work to repeat lipid panel and hepatic function in 6 weeks. -Follow a heart healthy diet and increase physical activity. Stay well-hydrated. -Recommend to follow-up with dermatology for annual skin cancer screening. -Follow-up in 6 months for regular OV: Thyroid, HLD   Meds ordered this encounter  Medications  . atorvastatin (LIPITOR) 10 MG tablet    Sig: Take 1 tablet (10 mg total) by mouth daily.    Dispense:  90 tablet    Refill:  1    Order Specific Question:   Supervising Provider    Answer:   Beatrice Lecher D [[id number]]    Orders Placed This Encounter  Procedures  . Varicella-zoster vaccine IM (Shingrix)  . Flu Vaccine QUAD 36+ mos IM     Return in about 6 months (around 06/29/2020) for Thyroid, HLD; lab visit in 6 weeks for lipid panel and hepatic function.    Gross side effects, risk and benefits, and alternatives of medications discussed with patient.  Patient is aware that all medications have potential side effects and we are unable to predict every side effect or drug-drug interaction that may occur.  Expresses verbal understanding and consents to current therapy plan and treatment  regimen.  F-up preventative CPE in 1 year- this is in addition to any chronic care visits.    Please see orders placed and AVS handed out to patient at the end of our visit for further patient instructions/ counseling done pertaining to today's office visit.  Note:  This note was prepared with assistance of Dragon voice recognition software. Occasional wrong-word or sound-a-like substitutions may have occurred due to the inherent limitations of voice recognition software.     Subjective:     CPE HPI:  [name] is a 54 y.o. female who presents to [facility] today a yearly health maintenance exam.   Health Maintenance Summary  - Reviewed and updated, unless pt declines services.  Last Cologuard or Colonoscopy:   Requesting records from [facility]. Tobacco History Reviewed:  Y, former smoker Alcohol and/or drug use:    No concerns; no use Dental Home: Y Eye exams: Not recently Dermatology home: Y, not recently  Female Health:  PAP Smear - last known results: s/p hysterectomy  STD concerns:   none, monogamous Birth control method: s/p hysterectomy  Menses regular:  n/a Lumps or breast concerns:    none Breast Cancer Family History:    Yes, mother/maternal aunt/maternal grandmother     Immunization History  Administered Date(s) Administered  . Influenza,inj,Quad PF,6+ Mos 12/30/2019  . Zoster Recombinat (Shingrix) 12/30/2019     Health Maintenance  Topic Date Due  . Hepatitis C Screening  Never done  . URINE MICROALBUMIN  Never done  . COVID-19 Vaccine (1) Never done  . TETANUS/TDAP  Never done  . PAP SMEAR-Modifier  Never done  . MAMMOGRAM  Never done  . COLONOSCOPY  Never done  . INFLUENZA VACCINE  Completed  . HIV Screening  Completed     Wt Readings from Last 3 Encounters:  12/30/19 181 lb 11.2 oz (82.4 kg)  09/30/19 172 lb 14.4 oz (78.4 kg)  09/03/17 176 lb (79.8 kg)   BP Readings from Last 3 Encounters:  12/30/19 128/80  09/30/19 (!) 140/81  09/03/17 136/81   Pulse Readings from Last 3 Encounters:  12/30/19 85  09/30/19 77  09/29/12 (!) 108     Past Medical History:  Diagnosis Date  . Generalized headaches   . Thyroid disease    hypo      Past Surgical History:  Procedure Laterality Date  . ABDOMINAL HYSTERECTOMY N/A    Phreesia 08/30/2019  . APPENDECTOMY  2003  . CESAREAN SECTION  2005  . CESAREAN SECTION N/A    Phreesia 08/30/2019  . CHOLECYSTECTOMY    . ROBOTIC ASSISTED TOTAL HYSTERECTOMY  Bilateral 09/29/2012   Procedure: ROBOTIC ASSISTED TOTAL HYSTERECTOMY WITH BILATERAL SALPINGECTOMY;  Surgeon: Marvene Staff, MD;  Location: [facility];  Service: Gynecology;  Laterality: Bilateral;  3 hrs.  . TONSILLECTOMY        Family History  Problem Relation Age of Onset  . Stroke Father   . Transient ischemic attack Father   . Heart disease Mother   . COPD Mother   . Cancer Mother   . Breast cancer Mother   . Heart attack Mother   . Diabetes Mother   . Breast cancer Maternal Aunt   . Breast cancer Maternal Grandmother   . Diabetes Paternal Grandmother       Social History   Substance and Sexual Activity  Drug Use No  ,   Social History   Substance and Sexual Activity  Alcohol Use  Yes   Comment: rare  ,   Social History   Tobacco Use  Smoking Status Former Smoker  . Packs/day: 0.25  . Types: Cigarettes  . Quit date: 03/11/2013  . Years since quitting: 6.8  Smokeless Tobacco Never Used  ,   Social History   Substance and Sexual Activity  Sexual Activity Yes  . Birth control/protection: None    Current Outpatient Medications on File Prior to Visit  Medication Sig Dispense Refill  . acetaminophen (TYLENOL) 500 MG tablet Take 500 mg by mouth every 6 (six) hours as needed.    . Cholecalciferol (VITAMIN D3) 3000 UNITS TABS Take 3,000 Units by mouth daily.      Marland Kitchen ibuprofen (ADVIL) 200 MG tablet Take 200 mg by mouth every 6 (six) hours as needed.    . Multiple Vitamin (MULTIVITAMIN PO) Take 1 tablet by mouth daily.     Marland Kitchen omeprazole (PRILOSEC) 20 MG capsule Take 20 mg by mouth daily.    Marland Kitchen SYNTHROID 75 MCG tablet Take 75 mcg by mouth daily.     No current facility-administered medications on file prior to visit.    Allergies: Succinylcholine  Review of Systems: General:   Denies fever, chills, unexplained weight loss.  Optho/Auditory:   Denies visual changes, blurred vision/LOV Respiratory:   Denies SOB, DOE more than baseline levels.    Cardiovascular:   Denies chest pain, palpitations, new onset peripheral edema  Gastrointestinal:   Denies nausea, vomiting, diarrhea.  Genitourinary: Denies dysuria, freq/ urgency, flank pain or discharge from genitals.  Endocrine:     Denies hot or cold intolerance, polyuria, polydipsia. Musculoskeletal:   Denies unexplained myalgias, joint swelling, unexplained arthralgias, gait problems.  Skin:  Denies rash, suspicious lesions Neurological:     Denies dizziness, unexplained weakness, numbness  Psychiatric/Behavioral:   Denies mood changes, suicidal or homicidal ideations, hallucinations    Objective:    Blood pressure 128/80, pulse 85, temperature 98.7 F (37.1 C), temperature source Oral, height 5' 2.25" (1.581 m), weight 181 lb 11.2 oz (82.4 kg), SpO2 97 %. Body mass index is 32.97 kg/m. General Appearance:    Alert, cooperative, no distress, appears stated age  Head:    Normocephalic, without obvious abnormality, atraumatic  Eyes:    PERRL, conjunctiva/corneas clear, EOM's intact, both eyes  Ears:    Normal TM's and external ear canals, both ears  Nose:   Nares normal, septum midline, mucosa normal, no drainage    or sinus tenderness  Throat:   Lips w/o lesion, mucosa moist, and tongue normal; teeth and   gums normal  Neck:   Supple, symmetrical, trachea midline, no adenopathy;    thyroid:  no enlargement/tenderness  Back:     Symmetric, no curvature, ROM normal, no CVA tenderness  Lungs:     Clear to auscultation bilaterally, respirations unlabored, no       Wh/ R/ R  Chest Wall:    No tenderness or gross deformity; normal excursion   Heart:    Regular rate and rhythm, S1 and S2 normal, no murmur  Breast Exam:    Deferred. Has upcoming mammogram.  Abdomen:     Soft, non-tender, bowel sounds active all four quadrants, No G/R/R, no masses, no organomegaly  Genitalia:    Deferred. No concerns/sxs. S/p hysterectomy  Rectal:    Deferred. No concerns/sxs. UTD colonoscopy   Extremities:   Extremities normal, atraumatic, no cyanosis or gross edema  Pulses:   2+ and symmetric all extremities  Skin:  Warm, dry, Skin color, texture, turgor normal, no obvious rashes or lesions Psych: No HI/SI, judgement and insight good, Euthymic mood. Full Affect.  Neurologic:   CNII-XII grossly intact, normal strength, sensation and reflexes throughout

## 2020-01-11 ENCOUNTER — Encounter: Payer: Self-pay | Admitting: Physician Assistant

## 2020-02-14 ENCOUNTER — Other Ambulatory Visit: Payer: Self-pay | Admitting: Physician Assistant

## 2020-02-14 DIAGNOSIS — E785 Hyperlipidemia, unspecified: Secondary | ICD-10-CM

## 2020-02-14 DIAGNOSIS — E1169 Type 2 diabetes mellitus with other specified complication: Secondary | ICD-10-CM

## 2020-02-15 ENCOUNTER — Other Ambulatory Visit: Payer: No Typology Code available for payment source

## 2020-02-15 ENCOUNTER — Other Ambulatory Visit: Payer: Self-pay

## 2020-02-15 DIAGNOSIS — E1169 Type 2 diabetes mellitus with other specified complication: Secondary | ICD-10-CM

## 2020-02-16 LAB — HEPATIC FUNCTION PANEL
ALT: 24 IU/L (ref 0–32)
AST: 17 IU/L (ref 0–40)
Albumin: 4.7 g/dL (ref 3.8–4.9)
Alkaline Phosphatase: 120 IU/L (ref 44–121)
Bilirubin Total: 1 mg/dL (ref 0.0–1.2)
Bilirubin, Direct: 0.22 mg/dL (ref 0.00–0.40)
Total Protein: 6.9 g/dL (ref 6.0–8.5)

## 2020-02-16 LAB — LIPID PANEL
Chol/HDL Ratio: 3.6 ratio (ref 0.0–4.4)
Cholesterol, Total: 155 mg/dL (ref 100–199)
HDL: 43 mg/dL (ref >39)
LDL Chol Calc (NIH): 85 mg/dL (ref 0–99)
Triglycerides: 155 mg/dL — ABNORMAL HIGH (ref 0–149)
VLDL Cholesterol Cal: 27 mg/dL (ref 5–40)

## 2020-02-24 ENCOUNTER — Other Ambulatory Visit: Payer: Self-pay

## 2020-02-24 ENCOUNTER — Ambulatory Visit (INDEPENDENT_AMBULATORY_CARE_PROVIDER_SITE_OTHER): Payer: No Typology Code available for payment source | Admitting: Physician Assistant

## 2020-02-24 ENCOUNTER — Encounter: Payer: Self-pay | Admitting: Physician Assistant

## 2020-02-24 VITALS — BP 144/80 | HR 78 | Temp 99.0°F | Ht 62.25 in | Wt 177.4 lb

## 2020-02-24 DIAGNOSIS — M25552 Pain in left hip: Secondary | ICD-10-CM

## 2020-02-24 DIAGNOSIS — G5621 Lesion of ulnar nerve, right upper limb: Secondary | ICD-10-CM | POA: Diagnosis not present

## 2020-02-24 DIAGNOSIS — G8929 Other chronic pain: Secondary | ICD-10-CM

## 2020-02-24 MED ORDER — MELOXICAM 15 MG PO TABS: 15.0000 mg | ORAL_TABLET | Freq: Every day | ORAL | 0 refills | Status: DC

## 2020-02-24 MED ORDER — GABAPENTIN 100 MG PO CAPS: 100.0000 mg | ORAL_CAPSULE | Freq: Every evening | ORAL | 0 refills | Status: DC | PRN

## 2020-02-24 NOTE — Patient Instructions (Signed)
Cubital Tunnel Syndrome  Cubital tunnel syndrome is a condition that causes pain and weakness of the forearm and hand. It happens when one of the nerves that runs along the inside of the elbow joint (ulnar nerve) becomes irritated. This condition is usually caused by repeated arm motions that are done during sports or work-related activities. What are the causes? This condition may be caused by:  Increased pressure on the ulnar nerve at the elbow, arm, or forearm. This can result from: ? Irritation caused by repeated elbow bending. ? Poorly healed elbow fractures. ? Tumors in the elbow. These are usually noncancerous (benign). ? Scar tissue that develops in the elbow after an injury. ? Bony growths (spurs) near the ulnar nerve.  Stretching of the nerve due to loose elbow ligaments.  Trauma to the nerve at the elbow. What increases the risk? The following factors may make you more likely to develop this condition:  Doing manual labor that requires frequent bending of the elbow.  Playing sports that include repeated or strenuous throwing motions, such as baseball.  Playing contact sports, such as football or lacrosse.  Not warming up properly before activities.  Having diabetes.  Having an underactive thyroid (hypothyroidism). What are the signs or symptoms? Symptoms of this condition include:  Clumsiness or weakness of the hand.  Tenderness of the inner elbow.  Aching or soreness of the inner elbow, forearm, or fingers, especially the little finger or the ring finger.  Increased pain when forcing the elbow to bend.  Reduced control when throwing objects.  Tingling, numbness, or a burning feeling inside the forearm or in part of the hand or fingers, especially the little finger or the ring finger.  Sharp pains that shoot from the elbow down to the wrist and hand.  The inability to grip or pinch hard. How is this diagnosed? This condition is diagnosed based on:  Your  symptoms and medical history. Your health care provider will also ask for details about any injury.  A physical exam. You may also have tests, including:  Electromyogram (EMG). This test measures electrical signals sent by your nerves into the muscles.  Nerve conduction study. This test measures how well electrical signals pass through your nerves.  Imaging tests, such as X-rays, ultrasound, and MRI. These tests check for possible causes of your condition. How is this treated? This condition may be treated by:  Stopping the activities that are causing your symptoms to get worse.  Icing and taking medicines to reduce pain and swelling.  Wearing a splint to prevent your elbow from bending, or wearing an elbow pad where the ulnar nerve is closest to the skin.  Working with a physical therapist in less severe cases. This may help to: ? Decrease your symptoms. ? Improve the strength and range of motion of your elbow, forearm, and hand. If these treatments do not help, surgery may be needed. Follow these instructions at home: If you have a splint:  Wear the splint as told by your health care provider. Remove it only as told by your health care provider.  Loosen the splint if your fingers tingle, become numb, or turn cold and blue.  Keep the splint clean.  If the splint is not waterproof: ? Do not let it get wet. ? Cover it with a watertight covering when you take a bath or shower. Managing pain, stiffness, and swelling   If directed, put ice on the injured area: ? Put ice in a plastic bag. ?   Place a towel between your skin and the bag. ? Leave the ice on for 20 minutes, 2-3 times a day.  Move your fingers often to avoid stiffness and to lessen swelling.  Raise (elevate) the injured area above the level of your heart while you are sitting or lying down. General instructions  Take over-the-counter and prescription medicines only as told by your health care provider.  Do any  exercise or physical therapy as told by your health care provider.  Do not drive or use heavy machinery while taking prescription pain medicine.  If you were given an elbow pad, wear it as told by your health care provider.  Keep all follow-up visits as told by your health care provider. This is important. Contact a health care provider if:  Your symptoms get worse.  Your symptoms do not get better with treatment.  You have new pain.  Your hand on the injured side feels numb or cold. Summary  Cubital tunnel syndrome is a condition that causes pain and weakness of the forearm and hand.  You are more likely to develop this condition if you do work or play sports that involve repeated arm movements.  This condition is often treated by stopping repetitive activities, applying ice, and using anti-inflammatory medicines.  In rare cases, surgery may be needed. This information is not intended to replace advice given to you by your health care provider. Make sure you discuss any questions you have with your health care provider. Document Revised: 07/14/2017 Document Reviewed: 07/14/2017 Elsevier Patient Education  2020 Elsevier Inc.  

## 2020-02-24 NOTE — Progress Notes (Signed)
Acute Office Visit  Subjective:    Patient ID: [name], female    DOB: [birth year], 54 y.o.   MRN: [id number]  No chief complaint on file.   HPI Patient is in today for right arm pain/tingling  1. 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 Left anterior hip /groin pain (chronic)  This is a recurrent problem that flares up - pain is 5/10 severity, sometimes interferes with sleep Takes Tylenol and Ibuprofen daily x 6 months She is physically active walking 1 mile daily and states this help  Hiking for 3-4 miles will cause limping - several times per week She was evaluated for this in 2019 - X-ray showed mild degenerative changes She was treated with Meloxicam and home rehab exercises, which she states were helpful  Past Medical History:  Diagnosis Date  . Generalized headaches   . Thyroid disease    hypo    Past Surgical History:  Procedure Laterality Date  . ABDOMINAL HYSTERECTOMY N/A    Phreesia 08/30/2019  . APPENDECTOMY  2003  . CESAREAN SECTION  2005  . CESAREAN SECTION N/A    Phreesia 08/30/2019  . CHOLECYSTECTOMY    . ROBOTIC ASSISTED TOTAL HYSTERECTOMY Bilateral 09/29/2012   Procedure: ROBOTIC ASSISTED TOTAL HYSTERECTOMY WITH BILATERAL SALPINGECTOMY;  Surgeon: Marvene Staff, MD;  Location: [facility];  Service: Gynecology;  Laterality: Bilateral;  3 hrs.  . TONSILLECTOMY      Family History  Problem Relation Age of Onset  . Stroke Father   . Transient ischemic attack Father   . Heart disease Mother   . COPD Mother   . Cancer Mother   . Breast cancer Mother   . Heart attack Mother   . Diabetes Mother   . Breast cancer Maternal Aunt   . Breast cancer Maternal  Grandmother   . Diabetes Paternal Grandmother     Social History   Socioeconomic History  . Marital status: Married    Spouse name: Jewel Baize  . Number of children: 1  . Years of education: Not on file  . Highest education level: Bachelor's degree (e.g., BA, AB, BS)  Occupational History  . Occupation: document Games developer: [employer]  Tobacco Use  . Smoking status: Former Smoker    Packs/day: 0.25    Types: Cigarettes    Quit date: 03/11/2013    Years since quitting: 6.9  . Smokeless tobacco: Never Used  Vaping Use  . Vaping Use: Never used  Substance and Sexual Activity  . Alcohol use: Yes    Comment: rare  . Drug use: No  . Sexual activity: Yes    Birth control/protection: None  Other Topics Concern  . Not on file  Social History Narrative  . Not on file   Social Determinants of Health   Financial Resource Strain: Not on file  Food Insecurity: Not on file  Transportation Needs: Not on file  Physical Activity: Not on file  Stress: Not on file  Social Connections: Not on file  Intimate Partner Violence: Not on file    Outpatient Medications Prior to Visit  Medication Sig Dispense Refill  . acetaminophen (TYLENOL) 500 MG tablet Take 500 mg  by mouth every 6 (six) hours as needed.    Marland Kitchen atorvastatin (LIPITOR) 10 MG tablet Take 1 tablet (10 mg total) by mouth daily. 90 tablet 1  . Cholecalciferol (VITAMIN D3) 3000 UNITS TABS Take 3,000 Units by mouth daily.    . Coenzyme Q10 200 MG capsule Take 200 mg by mouth daily.    Marland Kitchen ibuprofen (ADVIL) 200 MG tablet Take 200 mg by mouth every 6 (six) hours as needed.    . Multiple Vitamin (MULTIVITAMIN PO) Take 1 tablet by mouth daily.    Marland Kitchen omeprazole (PRILOSEC) 20 MG capsule Take 20 mg by mouth daily.    Marland Kitchen SYNTHROID 75 MCG tablet Take 75 mcg by mouth daily.     No facility-administered medications prior to visit.    Allergies  Allergen Reactions  . Succinylcholine Rash    Review of Systems   Musculoskeletal: Positive for arthralgias and myalgias.  All other systems reviewed and are negative.      Objective:    Physical Exam Musculoskeletal:     Right elbow: No deformity. Normal range of motion. No tenderness.     Left elbow: Normal range of motion. No tenderness.     Right forearm: Tenderness (ulnar aspect) present. No swelling, edema, deformity or bony tenderness.     Left forearm: Normal.     Right wrist: Normal.     Right hand: Normal strength.     Left hand: Normal strength.     Left hip: No deformity or tenderness (no greater trochanter tenderness). Decreased range of motion (and pain with external rotation). Normal strength.     Comments: + tinels of the right elbow     BP (!) 144/80   Pulse 78   Temp 99 F (37.2 C) (Oral)   Ht 5' 2.25" (1.581 m)   Wt 177 lb 6.4 oz (80.5 kg)   SpO2 98% Comment: on RA  BMI 32.19 kg/m  Wt Readings from Last 3 Encounters:  02/24/20 177 lb 6.4 oz (80.5 kg)  12/30/19 181 lb 11.2 oz (82.4 kg)  09/30/19 172 lb 14.4 oz (78.4 kg)        Assessment & Plan:   Problem List Items Addressed This Visit   None   Visit Diagnoses    Ulnar neuropathy at elbow of right upper extremity    -  Primary   Relevant Medications   meloxicam (MOBIC) 15 MG tablet   gabapentin (NEURONTIN) 100 MG capsule   Other Relevant Orders   Ambulatory referral to Sports Medicine   Ambulatory referral to Physical Therapy   Chronic left hip pain       Relevant Medications   meloxicam (MOBIC) 15 MG tablet   gabapentin (NEURONTIN) 100 MG capsule   Other Relevant Orders   Ambulatory referral to Sports Medicine   Ambulatory referral to Physical Therapy       Meds ordered this encounter  Medications  . meloxicam (MOBIC) 15 MG tablet    Sig: Take 1 tablet (15 mg total) by mouth daily with breakfast.    Dispense:  30 tablet    Refill:  0    Order Specific Question:   Supervising Provider    Answer:   Emeterio Reeve [[id number]]  . gabapentin  (NEURONTIN) 100 MG capsule    Sig: Take 1 capsule (100 mg total) by mouth at bedtime as needed (arm pain).    Dispense:  30 capsule    Refill:  0    Order Specific Question:  Supervising Provider    Answer:   Emeterio Reeve [[id number]]   Right forearm pain with numbness/tingling of the 3rd-5th digits Suspect ulnar neuropathy Wear OTC elbow sleeve at bedtime Meloxicam 15 mg daily - avoid OTC Ibuprofen/NSAIDs Gabapentin 100 mg QHS for nocturnal pain Referral to PT Follow-up if no improvement in 4 weeks  Chronic left anterior hip/groin pain Has failed conservative management, taking OTC analgesics daily x 6 months and performing home rehab exercises Meloxicam as above Referral to Sports Medicine to confirm diagnosis and discuss potential steroid injection Referral for formal PT  Follow-up prn  Trixie Dredge, PA-C

## 2020-03-07 ENCOUNTER — Ambulatory Visit (INDEPENDENT_AMBULATORY_CARE_PROVIDER_SITE_OTHER): Payer: No Typology Code available for payment source

## 2020-03-07 ENCOUNTER — Encounter: Payer: Self-pay | Admitting: Orthopaedic Surgery

## 2020-03-07 ENCOUNTER — Ambulatory Visit (INDEPENDENT_AMBULATORY_CARE_PROVIDER_SITE_OTHER): Payer: No Typology Code available for payment source | Admitting: Orthopaedic Surgery

## 2020-03-07 DIAGNOSIS — M25521 Pain in right elbow: Secondary | ICD-10-CM | POA: Diagnosis not present

## 2020-03-07 DIAGNOSIS — M25552 Pain in left hip: Secondary | ICD-10-CM | POA: Diagnosis not present

## 2020-03-07 DIAGNOSIS — R200 Anesthesia of skin: Secondary | ICD-10-CM | POA: Diagnosis not present

## 2020-03-07 MED ORDER — PREDNISONE 5 MG (21) PO TBPK: ORAL_TABLET | ORAL | 0 refills | Status: DC

## 2020-03-07 NOTE — Progress Notes (Signed)
Office Visit Note   [name]           Date of Birth: 25-Sep-1965           MRN: [id number] Visit Date: 03/07/2020              Requested by: Carlis Stable, PA-C [address] [place],  [place] [address] PCP: Mayer Masker, PA-C   Assessment & Plan: Visit Diagnoses:  1. Right arm numbness   2. Pain in left hip   3. Pain in right elbow     Plan: Impression is left hip osteoarthritis and right hand paresthesias likely from cervical spine radiculopathy versus cubital tunnel syndrome.  In regards to the left hip, we will refer her to Dr. Prince Rome for ultrasound-guided cortisone injection.  In regards to the right upper extremity paresthesias, believe her symptoms are more consistent with cervical spine radiculopathy as she has some symptoms radiating up her arm as well as significant degenerative changes radiographically.  We will start her on a steroid taper for this.  She will follow up with [place] in about 6 weeks time for recheck.  At that point we may entertain physical therapy and/or nerve conduction study/EMG.  Call with concerns or questions in the meantime  Follow-Up Instructions: Return in about 6 weeks (around 04/18/2020).   Orders:  Orders Placed This Encounter  Procedures  . XR Cervical Spine 2 or 3 views  . XR Elbow Complete Right (3+View)  . XR HIP UNILAT W OR W/O PELVIS 2-3 VIEWS LEFT   Meds ordered this encounter  Medications  . predniSONE (STERAPRED UNI-PAK 21 TAB) 5 MG (21) TBPK tablet    Sig: Take as directed    Dispense:  21 tablet    Refill:  0      Procedures: No procedures performed   Clinical Data: No additional findings.   Subjective: Chief Complaint  Patient presents with  . Right Elbow - Pain  . Left Hip - Pain  . Right Arm - Numbness    HPI is a very pleasant 54 year old female who comes in today with left hip pain as well as right hand paresthesias.  In regards to the left hip, she has had pain here for  several years which has progressively worsened.  The pain she has is primarily to the groin and anterior thigh.  She has increased pain with external rotation and hip abduction movements as well as when she is hiking.  She has been taking NSAIDs to include Mobic as well as Tylenol without relief of symptoms.  She has not previously had a cortisone injection to the left hip.  In regards to the right hand paresthesias, she notes numbness and tingling to her small and ring fingers which occasionally radiates up the arm into the elbow and into the triceps region.  This is been ongoing for the past month.  No known injury or change in activity but she does note that she uses the mouse a lot while at work.  She also notes that over this period of time she has been sleeping on her right side due to left hip pain.  No previous cervical spine pathology that she is aware of.  Review of Systems as detailed in HPI.  All others reviewed and are negative.   Objective: Vital Signs: There were no vitals taken for this visit.  Physical Exam well-developed well-nourished female in no acute distress.  Alert oriented x3.  Ortho  Exam examination of her left hip reveals a positive logroll.  Negative FADIR.  No tenderness to the greater trochanter.  Right upper extremity exam shows positive Tinel at the elbow.  Negative Tinel at the wrist.  She has slight limitation with range of motion of the cervical spine secondary to stiffness.  She is neurovascular intact distally.  Specialty Comments:  No specialty comments available.  Imaging: No results found.   PMFS History: Patient Active Problem List   Diagnosis Date Noted  . Hypothyroidism 09/30/2019  . Heartburn 09/30/2019  . Left hip pain 08/06/2017  . Cholelithiases 10/11/2010   Past Medical History:  Diagnosis Date  . Generalized headaches   . Thyroid disease    hypo    Family History  Problem Relation Age of Onset  . Stroke Father   . Transient ischemic  attack Father   . Heart disease Mother   . COPD Mother   . Cancer Mother   . Breast cancer Mother   . Heart attack Mother   . Diabetes Mother   . Breast cancer Maternal Aunt   . Breast cancer Maternal Grandmother   . Diabetes Paternal Grandmother     Past Surgical History:  Procedure Laterality Date  . ABDOMINAL HYSTERECTOMY N/A    Phreesia 08/30/2019  . APPENDECTOMY  2003  . CESAREAN SECTION  2005  . CESAREAN SECTION N/A    Phreesia 08/30/2019  . CHOLECYSTECTOMY    . ROBOTIC ASSISTED TOTAL HYSTERECTOMY Bilateral 09/29/2012   Procedure: ROBOTIC ASSISTED TOTAL HYSTERECTOMY WITH BILATERAL SALPINGECTOMY;  Surgeon: Marvene Staff, MD;  Location: [facility];  Service: Gynecology;  Laterality: Bilateral;  3 hrs.  . TONSILLECTOMY     Social History   Occupational History  . Occupation: document Games developer: [employer]  Tobacco Use  . Smoking status: Former Smoker    Packs/day: 0.25    Types: Cigarettes    Quit date: 03/11/2013    Years since quitting: 6.9  . Smokeless tobacco: Never Used  Vaping Use  . Vaping Use: Never used  Substance and Sexual Activity  . Alcohol use: Yes    Comment: rare  . Drug use: No  . Sexual activity: Yes    Birth control/protection: None

## 2020-03-14 ENCOUNTER — Ambulatory Visit: Payer: No Typology Code available for payment source | Admitting: Family Medicine

## 2020-03-16 ENCOUNTER — Other Ambulatory Visit: Payer: Self-pay | Admitting: Physician Assistant

## 2020-03-16 DIAGNOSIS — G5621 Lesion of ulnar nerve, right upper limb: Secondary | ICD-10-CM

## 2020-03-23 ENCOUNTER — Ambulatory Visit: Payer: No Typology Code available for payment source | Admitting: Family Medicine

## 2020-03-23 ENCOUNTER — Ambulatory Visit: Payer: Self-pay

## 2020-03-23 ENCOUNTER — Encounter: Payer: Self-pay | Admitting: Family Medicine

## 2020-03-23 ENCOUNTER — Other Ambulatory Visit: Payer: Self-pay

## 2020-03-23 DIAGNOSIS — M25552 Pain in left hip: Secondary | ICD-10-CM

## 2020-03-23 NOTE — Patient Instructions (Signed)
   Glucosamine sulfate:  1,000 mg twice daily  Turmeric:  500 mg twice daily  Avoid sugar.  Sit with good posture.  Walk for exercise.

## 2020-03-23 NOTE — Progress Notes (Signed)
Subjective: Patient is here for ultrasound-guided intra-articular left hip injection.   Pain for 2 years, strong family history of hip arthritis requiring replacement.  Objective: Pain and decreased range of motion with passive internal rotation.  Procedure: Ultrasound guided injection is preferred based studies that show increased duration, increased effect, greater accuracy, decreased procedural pain, increased response rate, and decreased cost with ultrasound guided versus blind injection.   Verbal informed consent obtained.  Time-out conducted.  Noted no overlying erythema, induration, or other signs of local infection. Ultrasound-guided left hip injection: After sterile prep with Betadine, injected 4 cc 0.25% bupivacaine without epinephrine and 6 mg betamethasone using a 22-gauge spinal needle, passing the needle through the iliofemoral ligament into the femoral head/neck junction.  Injectate seen filling the joint capsule.  Good immediate relief.

## 2020-04-18 ENCOUNTER — Ambulatory Visit: Payer: No Typology Code available for payment source | Admitting: Orthopaedic Surgery

## 2020-04-19 ENCOUNTER — Encounter: Payer: Self-pay | Admitting: Physician Assistant

## 2020-04-25 ENCOUNTER — Encounter: Payer: Self-pay | Admitting: Orthopaedic Surgery

## 2020-04-25 ENCOUNTER — Ambulatory Visit: Payer: No Typology Code available for payment source | Admitting: Orthopaedic Surgery

## 2020-04-25 DIAGNOSIS — M5412 Radiculopathy, cervical region: Secondary | ICD-10-CM

## 2020-04-25 DIAGNOSIS — M1612 Unilateral primary osteoarthritis, left hip: Secondary | ICD-10-CM

## 2020-04-25 NOTE — Progress Notes (Signed)
Office Visit Note   [name]           Date of Birth: [birth year]           MRN: [id number] Visit Date: 04/25/2020              Requested by: Lorrene Reid, PA-C [place] [place],  [place] [address] PCP: Lorrene Reid, PA-C   Assessment & Plan: Visit Diagnoses:  1. Primary osteoarthritis of left hip   2. Cervical radiculopathy     Plan: Impression is stable left hip DJD and cervical radiculopathy.  Overall of both conditions are improving and not preventing her from activities that she enjoys.  Based on our discussion of treatment options she will take meloxicam as needed for both the hip and the radiculopathy.  We briefly discussed the possibility of getting a cervical spine MRI if she would be interested in [facility].  Questions encouraged and answered.  Follow-up as needed.  Follow-Up Instructions: Return if symptoms worsen or fail to improve.   Orders:  No orders of the defined types were placed in this encounter.  No orders of the defined types were placed in this encounter.     Procedures: No procedures performed   Clinical Data: No additional findings.   Subjective: Chief Complaint  Patient presents with  . Left Hip - Pain    Ms. [name] returns today for further discussion and evaluation of left hip pain due to DJD as well as right cervical radiculopathy.  In terms of the left hip the prior cortisone injection did help but it is not 100%.  She continues to take meloxicam as needed.  The pain is mild to moderate.  She is still able to garden and hike which she enjoys.  In terms of the cervical radiculopathy the prednisone Dosepak did help but she still has some residual numbness in the ring and middle fingers.  She does not have any weakness.   Review of Systems  Constitutional: Negative.   HENT: Negative.   Eyes: Negative.   Respiratory: Negative.   Cardiovascular: Negative.   Endocrine: Negative.   Musculoskeletal: Negative.    Neurological: Negative.   Hematological: Negative.   Psychiatric/Behavioral: Negative.   All other systems reviewed and are negative.    Objective: Vital Signs: There were no vitals taken for this visit.  Physical Exam Vitals and nursing note reviewed.  Constitutional:      Appearance: She is well-developed and well-nourished.  Pulmonary:     Effort: Pulmonary effort is normal.  Skin:    General: Skin is warm.     Capillary Refill: Capillary refill takes less than 2 seconds.  Neurological:     Mental Status: She is alert and oriented to person, place, and time.  Psychiatric:        Mood and Affect: Mood and affect normal.        Behavior: Behavior normal.        Thought Content: Thought content normal.        Judgment: Judgment normal.     Ortho Exam Left hip shows moderate pain with internal and external rotation.  Slight decreased range of motion secondary to pain. Cervical spine exam is unchanged. Specialty Comments:  No specialty comments available.  Imaging: No results found.   PMFS History: Patient Active Problem List   Diagnosis Date Noted  . Primary osteoarthritis of left hip 04/25/2020  . Hypothyroidism 09/30/2019  . Heartburn 09/30/2019  . Left  hip pain 08/06/2017  . Cholelithiases 10/11/2010   Past Medical History:  Diagnosis Date  . Generalized headaches   . Thyroid disease    hypo    Family History  Problem Relation Age of Onset  . Stroke Father   . Transient ischemic attack Father   . Heart disease Mother   . COPD Mother   . Cancer Mother   . Breast cancer Mother   . Heart attack Mother   . Diabetes Mother   . Breast cancer Maternal Aunt   . Breast cancer Maternal Grandmother   . Diabetes Paternal Grandmother     Past Surgical History:  Procedure Laterality Date  . ABDOMINAL HYSTERECTOMY N/A    Phreesia 08/30/2019  . APPENDECTOMY  2003  . CESAREAN SECTION  2005  . CESAREAN SECTION N/A    Phreesia 08/30/2019  . CHOLECYSTECTOMY     . ROBOTIC ASSISTED TOTAL HYSTERECTOMY Bilateral 09/29/2012   Procedure: ROBOTIC ASSISTED TOTAL HYSTERECTOMY WITH BILATERAL SALPINGECTOMY;  Surgeon: Marvene Staff, MD;  Location: [facility];  Service: Gynecology;  Laterality: Bilateral;  3 hrs.  . TONSILLECTOMY     Social History   Occupational History  . Occupation: document Games developer: [employer]  Tobacco Use  . Smoking status: Former Smoker    Packs/day: 0.25    Types: Cigarettes    Quit date: 03/11/2013    Years since quitting: 7.1  . Smokeless tobacco: Never Used  Vaping Use  . Vaping Use: Never used  Substance and Sexual Activity  . Alcohol use: Yes    Comment: rare  . Drug use: No  . Sexual activity: Yes    Birth control/protection: None

## 2020-06-22 ENCOUNTER — Other Ambulatory Visit: Payer: Self-pay | Admitting: Physician Assistant

## 2020-06-22 DIAGNOSIS — E1169 Type 2 diabetes mellitus with other specified complication: Secondary | ICD-10-CM

## 2020-06-22 DIAGNOSIS — E785 Hyperlipidemia, unspecified: Secondary | ICD-10-CM

## 2020-07-06 ENCOUNTER — Ambulatory Visit: Payer: No Typology Code available for payment source | Admitting: Physician Assistant

## 2020-07-07 ENCOUNTER — Ambulatory Visit (INDEPENDENT_AMBULATORY_CARE_PROVIDER_SITE_OTHER): Payer: No Typology Code available for payment source

## 2020-07-07 ENCOUNTER — Ambulatory Visit (INDEPENDENT_AMBULATORY_CARE_PROVIDER_SITE_OTHER): Payer: No Typology Code available for payment source | Admitting: Orthopaedic Surgery

## 2020-07-07 ENCOUNTER — Encounter: Payer: Self-pay | Admitting: Orthopaedic Surgery

## 2020-07-07 VITALS — Ht 63.0 in | Wt 175.0 lb

## 2020-07-07 DIAGNOSIS — M1612 Unilateral primary osteoarthritis, left hip: Secondary | ICD-10-CM

## 2020-07-07 DIAGNOSIS — M79671 Pain in right foot: Secondary | ICD-10-CM | POA: Insufficient documentation

## 2020-07-07 NOTE — Progress Notes (Signed)
Office Visit Note   [name]           Date of Birth: [birth year]           MRN: [id number] Visit Date: 07/07/2020              Requested by: Lorrene Reid, PA-C [address],  [place] [address] PCP: Lorrene Reid, PA-C   Assessment & Plan: Visit Diagnoses:  1. Pain of right heel   2. Primary osteoarthritis of left hip     Plan: For the heel we discussed conservative treatments such as arch supports stretching nighttime splints oral NSAIDs and cortisone injection if pain is limiting daily activities.  For the left hip DJD she has tried conservative treatments and has not been happy with the relief that she has gotten and the pain continues to limit her daily activities and quality of life.  After consideration of her options she would like to move forward with a left total hip replacement sometime in June.  Details of the surgery and the possible risks, benefits, recovery reviewed with the patient in detail.  All questions answered.  She will call Debbie directly once she knows for sure that she June will work for her.  Follow-Up Instructions: Return if symptoms worsen or fail to improve.   Orders:  Orders Placed This Encounter  Procedures  . XR Os Calcis Right   No orders of the defined types were placed in this encounter.     Procedures: No procedures performed   Clinical Data: No additional findings.   Subjective: Chief Complaint  Patient presents with  . Left Hip - Pain, Follow-up  . Right Foot - Pain    Ms. [name] is a very pleasant 55 year old female here for evaluation of chronic right heel pain for about a month and follow-up for left hip pain due to DJD.  In terms of the right heel she has had pain for about a month without any injuries.  The pain is centrally underneath the heel and is worse in the morning and with the first few steps after sitting.  Denies any numbness and tingling.  In terms of the left hip she has had  continued pain and difficulty with daily activities.  She only gets temporary relief from oral medications.  She has had a cortisone injection by Dr. Junius Roads couple months ago which gave her partial and temporary relief.   Review of Systems  Constitutional: Negative.   HENT: Negative.   Eyes: Negative.   Respiratory: Negative.   Cardiovascular: Negative.   Endocrine: Negative.   Musculoskeletal: Negative.   Neurological: Negative.   Hematological: Negative.   Psychiatric/Behavioral: Negative.   All other systems reviewed and are negative.    Objective: Vital Signs: Ht 5\' 3"  (1.6 m)   Wt 175 lb (79.4 kg)   BMI 31.00 kg/m   Physical Exam Vitals and nursing note reviewed.  Constitutional:      Appearance: She is well-developed.  Pulmonary:     Effort: Pulmonary effort is normal.  Skin:    General: Skin is warm.     Capillary Refill: Capillary refill takes less than 2 seconds.  Neurological:     Mental Status: She is alert and oriented to person, place, and time.  Psychiatric:        Behavior: Behavior normal.        Thought Content: Thought content normal.        Judgment: Judgment normal.  Ortho Exam Right heel shows point tenderness centrally.  Negative lateral compression, negative Tinel's at tarsal tunnel.  Achilles is nontender.  No significant Achilles contracture.  Left hip exam shows pain and limitation of range of motion.  Slight antalgic gait. Specialty Comments:  No specialty comments available.  Imaging: XR Os Calcis Right  Result Date: 07/07/2020 Small plantar calcaneal spur.  No acute abnormalities.    PMFS History: Patient Active Problem List   Diagnosis Date Noted  . Pain of right heel 07/07/2020  . Primary osteoarthritis of left hip 04/25/2020  . Hypothyroidism 09/30/2019  . Heartburn 09/30/2019  . Left hip pain 08/06/2017  . Cholelithiases 10/11/2010   Past Medical History:  Diagnosis Date  . Generalized headaches   . Thyroid  disease    hypo    Family History  Problem Relation Age of Onset  . Stroke Father   . Transient ischemic attack Father   . Heart disease Mother   . COPD Mother   . Cancer Mother   . Breast cancer Mother   . Heart attack Mother   . Diabetes Mother   . Breast cancer Maternal Aunt   . Breast cancer Maternal Grandmother   . Diabetes Paternal Grandmother     Past Surgical History:  Procedure Laterality Date  . ABDOMINAL HYSTERECTOMY N/A    Phreesia 08/30/2019  . APPENDECTOMY  2003  . CESAREAN SECTION  2005  . CESAREAN SECTION N/A    Phreesia 08/30/2019  . CHOLECYSTECTOMY    . ROBOTIC ASSISTED TOTAL HYSTERECTOMY Bilateral 09/29/2012   Procedure: ROBOTIC ASSISTED TOTAL HYSTERECTOMY WITH BILATERAL SALPINGECTOMY;  Surgeon: Marvene Staff, MD;  Location: [facility];  Service: Gynecology;  Laterality: Bilateral;  3 hrs.  . TONSILLECTOMY     Social History   Occupational History  . Occupation: document Games developer: [employer]  Tobacco Use  . Smoking status: Former Smoker    Packs/day: 0.25    Types: Cigarettes    Quit date: 03/11/2013    Years since quitting: 7.3  . Smokeless tobacco: Never Used  Vaping Use  . Vaping Use: Never used  Substance and Sexual Activity  . Alcohol use: Yes    Comment: rare  . Drug use: No  . Sexual activity: Yes    Birth control/protection: None

## 2020-07-13 ENCOUNTER — Encounter: Payer: Self-pay | Admitting: Physician Assistant

## 2020-07-13 ENCOUNTER — Ambulatory Visit: Payer: No Typology Code available for payment source | Admitting: Physician Assistant

## 2020-07-13 ENCOUNTER — Other Ambulatory Visit: Payer: Self-pay

## 2020-07-13 VITALS — BP 128/79 | HR 68 | Temp 99.6°F | Ht 63.0 in | Wt 176.4 lb

## 2020-07-13 DIAGNOSIS — M1612 Unilateral primary osteoarthritis, left hip: Secondary | ICD-10-CM

## 2020-07-13 DIAGNOSIS — K219 Gastro-esophageal reflux disease without esophagitis: Secondary | ICD-10-CM | POA: Insufficient documentation

## 2020-07-13 DIAGNOSIS — Z23 Encounter for immunization: Secondary | ICD-10-CM

## 2020-07-13 DIAGNOSIS — K5730 Diverticulosis of large intestine without perforation or abscess without bleeding: Secondary | ICD-10-CM | POA: Insufficient documentation

## 2020-07-13 DIAGNOSIS — J301 Allergic rhinitis due to pollen: Secondary | ICD-10-CM | POA: Insufficient documentation

## 2020-07-13 DIAGNOSIS — E782 Mixed hyperlipidemia: Secondary | ICD-10-CM | POA: Insufficient documentation

## 2020-07-13 DIAGNOSIS — E039 Hypothyroidism, unspecified: Secondary | ICD-10-CM

## 2020-07-13 DIAGNOSIS — E7800 Pure hypercholesterolemia, unspecified: Secondary | ICD-10-CM | POA: Insufficient documentation

## 2020-07-13 DIAGNOSIS — E559 Vitamin D deficiency, unspecified: Secondary | ICD-10-CM | POA: Insufficient documentation

## 2020-07-13 NOTE — Patient Instructions (Signed)

## 2020-07-13 NOTE — Progress Notes (Signed)
Established Patient Office Visit  Subjective:  Patient ID: [name], female    DOB: 1965/07/21  Age: 55 y.o. MRN: [id number]  CC:  Chief Complaint  Patient presents with  . Follow-up  . Thyroid Problem    HPI [name] presents for follow up on hyperlipidemia and hypothyroidism. Patient is due for second shingles dose.  HLD: Pt taking medication as directed without issues. Patient is scheduled to have hip replacement next month. Reports tried cortisone injection and other conservative treatment options which were ineffective. Is hoping surgery will help improve her physical activity level.  Hypothyroidism: Denies fatigue, hair changes, weight or bowel changes. Taking medication as directed without issues. Reports has been on same dose for a while.    Past Medical History:  Diagnosis Date  . Generalized headaches   . Thyroid disease    hypo    Past Surgical History:  Procedure Laterality Date  . ABDOMINAL HYSTERECTOMY N/A    Phreesia 08/30/2019  . APPENDECTOMY  2003  . CESAREAN SECTION  2005  . CESAREAN SECTION N/A    Phreesia 08/30/2019  . CHOLECYSTECTOMY    . ROBOTIC ASSISTED TOTAL HYSTERECTOMY Bilateral 09/29/2012   Procedure: ROBOTIC ASSISTED TOTAL HYSTERECTOMY WITH BILATERAL SALPINGECTOMY;  Surgeon: Marvene Staff, MD;  Location: [facility];  Service: Gynecology;  Laterality: Bilateral;  3 hrs.  . TONSILLECTOMY      Family History  Problem Relation Age of Onset  . Stroke Father   . Transient ischemic attack Father   . Heart disease Mother   . COPD Mother   . Cancer Mother   . Breast cancer Mother   . Heart attack Mother   . Diabetes Mother   . Breast cancer Maternal Aunt   . Breast cancer Maternal Grandmother   . Diabetes Paternal Grandmother     Social History   Socioeconomic History  . Marital status: Married    Spouse name: Jewel Baize  . Number of children: 1  . Years of education: Not on file  . Highest education level: Bachelor's degree  (e.g., BA, AB, BS)  Occupational History  . Occupation: document Games developer: [employer]  Tobacco Use  . Smoking status: Former Smoker    Packs/day: 0.25    Types: Cigarettes    Quit date: 03/11/2013    Years since quitting: 7.3  . Smokeless tobacco: Never Used  Vaping Use  . Vaping Use: Never used  Substance and Sexual Activity  . Alcohol use: Yes    Comment: rare  . Drug use: No  . Sexual activity: Yes    Birth control/protection: None  Other Topics Concern  . Not on file  Social History Narrative  . Not on file   Social Determinants of Health   Financial Resource Strain: Not on file  Food Insecurity: Not on file  Transportation Needs: Not on file  Physical Activity: Not on file  Stress: Not on file  Social Connections: Not on file  Intimate Partner Violence: Not on file    Outpatient Medications Prior to Visit  Medication Sig Dispense Refill  . acetaminophen (TYLENOL) 500 MG tablet Take 500 mg by mouth every 6 (six) hours as needed.    Marland Kitchen atorvastatin (LIPITOR) 10 MG tablet TAKE 1 TABLET BY MOUTH EVERY DAY 90 tablet 0  . Cholecalciferol (VITAMIN D3) 3000 UNITS TABS Take 3,000 Units by mouth daily.    . Coenzyme Q10 200 MG capsule Take 200 mg by mouth daily.    Marland Kitchen  ibuprofen (ADVIL) 200 MG tablet Take 200 mg by mouth every 6 (six) hours as needed.    . meloxicam (MOBIC) 15 MG tablet Take 1 tablet (15 mg total) by mouth daily with breakfast. 30 tablet 0  . Multiple Vitamin (MULTIVITAMIN PO) Take 1 tablet by mouth daily.    Marland Kitchen omeprazole (PRILOSEC) 20 MG capsule Take 20 mg by mouth daily.    Marland Kitchen SYNTHROID 75 MCG tablet Take 75 mcg by mouth daily.    . nitrofurantoin, macrocrystal-monohydrate, (MACROBID) 100 MG capsule Take 100 mg by mouth 2 (two) times daily.     No facility-administered medications prior to visit.    Allergies  Allergen Reactions  . Succinylcholine Rash    ROS Review of Systems A fourteen system review of systems was performed and  found to be positive as per HPI.  Objective:    Physical Exam General:  Well Developed, well nourished, appropriate for stated age.  Neuro:  Alert and oriented,  extra-ocular muscles intact  HEENT:  Normocephalic, atraumatic, neck supple Skin:  no gross rash, warm, pink. Cardiac:  RRR, S1 S2 wnl's  Respiratory:  ECTA B/L w/o wheezing, crackles, rales, Not using accessory muscles, speaking in full sentences- unlabored. Vascular:  Ext warm, no cyanosis apprec.; cap RF less 2 sec. Psych:  No HI/SI, judgement and insight good, Euthymic mood. Full Affect.   BP 128/79   Pulse 68   Temp 99.6 F (37.6 C)   Ht 5\' 3"  (1.6 m)   Wt 176 lb 6.4 oz (80 kg)   SpO2 98%   BMI 31.25 kg/m  Wt Readings from Last 3 Encounters:  07/13/20 176 lb 6.4 oz (80 kg)  07/07/20 175 lb (79.4 kg)  02/24/20 177 lb 6.4 oz (80.5 kg)     Health Maintenance Due  Topic Date Due  . Hepatitis C Screening  Never done  . URINE MICROALBUMIN  Never done  . PAP SMEAR-Modifier  Never done  . MAMMOGRAM  Never done    There are no preventive care reminders to display for this patient.  Lab Results  Component Value Date   TSH 1.600 12/28/2019   Lab Results  Component Value Date   WBC 5.6 12/28/2019   HGB 14.2 12/28/2019   HCT 41.8 12/28/2019   MCV 89 12/28/2019   PLT 198 12/28/2019   Lab Results  Component Value Date   NA 143 12/28/2019   K 4.0 12/28/2019   CO2 26 12/28/2019   GLUCOSE 102 (H) 12/28/2019   BUN 12 12/28/2019   CREATININE 0.82 12/28/2019   BILITOT 1.0 02/15/2020   ALKPHOS 120 02/15/2020   AST 17 02/15/2020   ALT 24 02/15/2020   PROT 6.9 02/15/2020   ALBUMIN 4.7 02/15/2020   CALCIUM 9.8 12/28/2019   Lab Results  Component Value Date   CHOL 155 02/15/2020   Lab Results  Component Value Date   HDL 43 02/15/2020   Lab Results  Component Value Date   LDLCALC 85 02/15/2020   Lab Results  Component Value Date   TRIG 155 (H) 02/15/2020   Lab Results  Component Value Date    CHOLHDL 3.6 02/15/2020   Lab Results  Component Value Date   HGBA1C 5.3 12/28/2019      Assessment & Plan:   Problem List Items Addressed This Visit      Endocrine   Hypothyroidism     Musculoskeletal and Integument   Primary osteoarthritis of left hip    Other Visit Diagnoses  Need for shingles vaccine    -  Primary   Relevant Orders   Varicella-zoster vaccine IM (Shingrix) (Completed)   Mixed hyperlipidemia         Mixed hyperlipidemia: -Last lipid panel: total cholesterol 155 (improved from 245), triglycerides 155 (improved from 165), HDL 43, LDL 85 (imporved from 172). Overall lipid panel improved since starting statin therapy so will continue atorvastatin 10 mg. -Follow a heart healthy diet. -Will continue to monitor and repeat lipid panel with CPE.  Hypothyroidism: -Last TSH wnl. Will continue current medication regimen. Advised patient to request refill when needed. Still has RF from last PCP. -Will repeat TSH with CPE.  Primary osteoarthritis of left hip: -Followed by Orthopedics, Dr. Erlinda Hong. Our office contacted [facility] to inquire about surgical clearance and per Dr. Erlinda Hong no medical clearance necessary for hip arthoplasty.     No orders of the defined types were placed in this encounter.   Follow-up: Return in about 6 months (around 01/13/2021) for CPE and FBW.    Lorrene Reid, PA-C

## 2020-07-24 ENCOUNTER — Encounter: Payer: Self-pay | Admitting: Physician Assistant

## 2020-07-24 ENCOUNTER — Encounter: Payer: Self-pay | Admitting: Orthopaedic Surgery

## 2020-07-24 ENCOUNTER — Other Ambulatory Visit: Payer: Self-pay

## 2020-07-24 DIAGNOSIS — E039 Hypothyroidism, unspecified: Secondary | ICD-10-CM

## 2020-07-24 MED ORDER — SYNTHROID 75 MCG PO TABS: 75.0000 ug | ORAL_TABLET | Freq: Every day | ORAL | 1 refills | Status: DC

## 2020-07-26 ENCOUNTER — Other Ambulatory Visit: Payer: Self-pay

## 2020-08-02 ENCOUNTER — Telehealth: Payer: Self-pay | Admitting: Orthopaedic Surgery

## 2020-08-02 NOTE — Telephone Encounter (Signed)
Patient submitted medical release form, short term disability, and $25.00 check payment to [employer]. Accepted 08/02/20

## 2020-08-14 ENCOUNTER — Other Ambulatory Visit: Payer: Self-pay | Admitting: Physician Assistant

## 2020-08-14 MED ORDER — ONDANSETRON HCL 4 MG PO TABS: 4.0000 mg | ORAL_TABLET | Freq: Three times a day (TID) | ORAL | 0 refills | Status: DC | PRN

## 2020-08-14 MED ORDER — OXYCODONE-ACETAMINOPHEN 5-325 MG PO TABS: 1.0000 | ORAL_TABLET | Freq: Four times a day (QID) | ORAL | 0 refills | Status: DC | PRN

## 2020-08-14 MED ORDER — ASPIRIN EC 81 MG PO TBEC: 81.0000 mg | DELAYED_RELEASE_TABLET | Freq: Two times a day (BID) | ORAL | 0 refills | Status: DC

## 2020-08-14 MED ORDER — METHOCARBAMOL 500 MG PO TABS: 500.0000 mg | ORAL_TABLET | Freq: Two times a day (BID) | ORAL | 0 refills | Status: DC | PRN

## 2020-08-14 MED ORDER — DOCUSATE SODIUM 100 MG PO CAPS: 100.0000 mg | ORAL_CAPSULE | Freq: Every day | ORAL | 2 refills | Status: DC | PRN

## 2020-08-14 NOTE — Progress Notes (Signed)
Surgical Instructions    Your procedure is scheduled on 08/21/20.  Report to [facility] Main Entrance "A" at 08:00 A.M., then check in with the Admitting office.  Call this number if you have problems the morning of surgery:  [phone]   If you have any questions prior to your surgery date call [phone]: Open Monday-Friday 8am-4pm    Remember:  Do not eat after midnight the night before your surgery  You may drink clear liquids until 07:00 AM the morning of your surgery.   Clear liquids allowed are: Water, Non-Citrus Juices (without pulp), Carbonated Beverages, Clear Tea, Black Coffee Only, and Gatorade  Patient Instructions  . The night before surgery:  o No food after midnight. ONLY clear liquids after midnight  . The day of surgery (if you do NOT have diabetes):  o Drink ONE (1) Pre-Surgery Clear Ensure by 07:00 AM the morning of surgery. Drink in one sitting. Do not sip.  o This drink was given to you during your hospital  pre-op appointment visit. o Nothing else to drink after completing the  Pre-Surgery Clear Ensure.          If you have questions, please contact your surgeon's office.     Take these medicines the morning of surgery with A SIP OF WATER  acetaminophen (TYLENOL) if needed atorvastatin (LIPITOR)  omeprazole (PRILOSEC) if needed ondansetron [facility]) if needed SYNTHROID   As of today, STOP taking any Aspirin (unless otherwise instructed by your surgeon) Aleve, Naproxen, Ibuprofen, Motrin, Advil, Goody's, BC's, all herbal medications, fish oil, meloxicam (MOBIC) and all vitamins.          Do not wear jewelry or makeup Do not wear lotions, powders, perfumes, or deodorant. Do not shave 48 hours prior to surgery.   Do not bring valuables to the hospital. DO Not wear nail polish, gel polish, artificial nails, or any other type of covering on natural nails including finger and toenails. If patients have artificial nails, gel coating, etc. that need to  be removed by a nail salon please have this removed prior to surgery or surgery may need to be canceled/delayed if the surgeon/ anesthesia feels like the patient is unable to be adequately monitored.             [facility] is not responsible for any belongings or valuables.  Do NOT Smoke (Tobacco/Vaping) or drink Alcohol 24 hours prior to your procedure If you use a CPAP at night, you may bring all equipment for your overnight stay.   Contacts, glasses, dentures or bridgework may not be worn into surgery, please bring cases for these belongings   For patients admitted to the hospital, discharge time will be determined by your treatment team.   Patients discharged the day of surgery will not be allowed to drive home, and someone needs to stay with them for 24 hours.    Special instructions:    Oral Hygiene is also important to reduce your risk of infection.  Remember - BRUSH YOUR TEETH THE MORNING OF SURGERY WITH YOUR REGULAR TOOTHPASTE   - Preparing For Surgery  Before surgery, you can play an important role. Because skin is not sterile, your skin needs to be as free of germs as possible. You can reduce the number of germs on your skin by washing with CHG (chlorahexidine gluconate) Soap before surgery.  CHG is an antiseptic cleaner which kills germs and bonds with the skin to continue killing germs even after washing.  Please do not use if you have an allergy to CHG or antibacterial soaps. If your skin becomes reddened/irritated stop using the CHG.  Do not shave (including legs and underarms) for at least 48 hours prior to first CHG shower. It is OK to shave your face.  Please follow these instructions carefully.    1.  Shower the NIGHT BEFORE SURGERY and the MORNING OF SURGERY with CHG Soap.   If you chose to wash your hair, wash your hair first as usual with your normal shampoo. After you shampoo, rinse your hair and body thoroughly to remove the shampoo.  Then [employer] and genitals (private parts) with your normal soap and rinse thoroughly to remove soap.  2. After that Use CHG Soap as you would any other liquid soap. You can apply CHG directly to the skin and wash gently with a scrungie or a clean washcloth.   3. Apply the CHG Soap to your body ONLY FROM THE NECK DOWN.  Do not use on open wounds or open sores. Avoid contact with your eyes, ears, mouth and genitals (private parts). Wash Face and genitals (private parts)  with your normal soap.   4. Wash thoroughly, paying special attention to the area where your surgery will be performed.  5. Thoroughly rinse your body with warm water from the neck down.  6. DO NOT shower/wash with your normal soap after using and rinsing off the CHG Soap.  7. Pat yourself dry with a CLEAN TOWEL.  8. Wear CLEAN PAJAMAS to bed the night before surgery  9. Place CLEAN SHEETS on your bed the night before your surgery  10. DO NOT SLEEP WITH PETS.   Day of Surgery: Take a shower with CHG soap. Wear Clean/Comfortable clothing the morning of surgery Do not apply any deodorants/lotions.   Remember to brush your teeth WITH YOUR REGULAR TOOTHPASTE.   Please read over the following fact sheets that you were given.

## 2020-08-15 ENCOUNTER — Encounter (HOSPITAL_COMMUNITY): Payer: Self-pay

## 2020-08-15 ENCOUNTER — Encounter (HOSPITAL_COMMUNITY)
Admission: RE | Admit: 2020-08-15 | Discharge: 2020-08-15 | Disposition: A | Discharge status: Home / Self Care | Payer: No Typology Code available for payment source | Source: Ambulatory Visit | Attending: Orthopaedic Surgery | Admitting: Orthopaedic Surgery

## 2020-08-15 ENCOUNTER — Ambulatory Visit (HOSPITAL_COMMUNITY)
Admission: RE | Admit: 2020-08-15 | Discharge: 2020-08-15 | Disposition: A | Discharge status: Home / Self Care | Payer: No Typology Code available for payment source | Source: Ambulatory Visit | Attending: Physician Assistant | Admitting: Physician Assistant

## 2020-08-15 ENCOUNTER — Other Ambulatory Visit: Payer: Self-pay

## 2020-08-15 DIAGNOSIS — M1612 Unilateral primary osteoarthritis, left hip: Secondary | ICD-10-CM | POA: Insufficient documentation

## 2020-08-15 HISTORY — DX: Hypothyroidism, unspecified: E03.9

## 2020-08-15 HISTORY — DX: Gastro-esophageal reflux disease without esophagitis: K21.9

## 2020-08-15 LAB — TYPE AND SCREEN
ABO/RH(D): O POS
Antibody Screen: NEGATIVE

## 2020-08-15 LAB — URINALYSIS, ROUTINE W REFLEX MICROSCOPIC
Bilirubin Urine: NEGATIVE
Glucose, UA: NEGATIVE mg/dL
Hgb urine dipstick: NEGATIVE
Ketones, ur: NEGATIVE mg/dL
Nitrite: NEGATIVE
Protein, ur: NEGATIVE mg/dL
Specific Gravity, Urine: 1.014 (ref 1.005–1.030)
pH: 6 (ref 5.0–8.0)

## 2020-08-15 LAB — CBC WITH DIFFERENTIAL/PLATELET
Abs Immature Granulocytes: 0.02 10*3/uL (ref 0.00–0.07)
Basophils Absolute: 0 10*3/uL (ref 0.0–0.1)
Basophils Relative: 0 %
Eosinophils Absolute: 0.2 10*3/uL (ref 0.0–0.5)
Eosinophils Relative: 2 %
HCT: 41.1 % (ref 36.0–46.0)
Hemoglobin: 14 g/dL (ref 12.0–15.0)
Immature Granulocytes: 0 %
Lymphocytes Relative: 26 %
Lymphs Abs: 2.1 10*3/uL (ref 0.7–4.0)
MCH: 30.7 pg (ref 26.0–34.0)
MCHC: 34.1 g/dL (ref 30.0–36.0)
MCV: 90.1 fL (ref 80.0–100.0)
Monocytes Absolute: 0.5 10*3/uL (ref 0.1–1.0)
Monocytes Relative: 7 %
Neutro Abs: 5.2 10*3/uL (ref 1.7–7.7)
Neutrophils Relative %: 65 %
Platelets: 199 10*3/uL (ref 150–400)
RBC: 4.56 MIL/uL (ref 3.87–5.11)
RDW: 12 % (ref 11.5–15.5)
WBC: 7.9 10*3/uL (ref 4.0–10.5)
nRBC: 0 % (ref 0.0–0.2)

## 2020-08-15 LAB — PROTIME-INR
INR: 1 (ref 0.8–1.2)
Prothrombin Time: 13.5 seconds (ref 11.4–15.2)

## 2020-08-15 LAB — COMPREHENSIVE METABOLIC PANEL
ALT: 24 U/L (ref 0–44)
AST: 20 U/L (ref 15–41)
Albumin: 4.3 g/dL (ref 3.5–5.0)
Alkaline Phosphatase: 93 U/L (ref 38–126)
Anion gap: 7 (ref 5–15)
BUN: 16 mg/dL (ref 6–20)
CO2: 29 mmol/L (ref 22–32)
Calcium: 9.7 mg/dL (ref 8.9–10.3)
Chloride: 103 mmol/L (ref 98–111)
Creatinine, Ser: 0.65 mg/dL (ref 0.44–1.00)
GFR, Estimated: >60 mL/min (ref >60)
Glucose, Bld: 96 mg/dL (ref 70–99)
Potassium: 3.8 mmol/L (ref 3.5–5.1)
Sodium: 139 mmol/L (ref 135–145)
Total Bilirubin: 1.2 mg/dL (ref 0.3–1.2)
Total Protein: 6.9 g/dL (ref 6.5–8.1)

## 2020-08-15 LAB — SURGICAL PCR SCREEN
MRSA, PCR: NEGATIVE
Staphylococcus aureus: NEGATIVE

## 2020-08-15 LAB — APTT: aPTT: 31 seconds (ref 24–36)

## 2020-08-15 NOTE — Pre-Procedure Instructions (Signed)
Surgical Instructions:    Your procedure is scheduled on Monday, June 13th (10:05 AM- 12:03 PM).  Report to [facility] Main Entrance "A" at 08:00 A.M., then check in with the Admitting office.  Call this number if you have any questions prior to, or have any problems the morning of surgery:  [phone]    Remember:  Do not eat after midnight the night before your surgery.  You may drink clear liquids until 07:00 AM the morning of your surgery.   Clear liquids allowed are: Water, Non-Citrus Juices (without pulp), Carbonated Beverages, Clear Tea, Black Coffee Only, and Gatorade.   Enhanced Recovery after Surgery for Orthopedics Enhanced Recovery after Surgery is a protocol used to improve the stress on your body and your recovery after surgery.  Patient Instructions  . The day of surgery (if you do NOT have diabetes):  o Drink ONE (1) Pre-Surgery Clear Ensure by 07:00 AM the morning of surgery.   o This drink was given to you during your hospital pre-op appointment visit. o Nothing else to drink after completing the Pre-Surgery Clear Ensure.          If you have questions, please contact your surgeon's office.     Take these medicines the morning of surgery with A SIP OF WATER: atorvastatin (LIPITOR) SYNTHROID  IF NEEDED: acetaminophen (TYLENOL) omeprazole (PRILOSEC) ondansetron (ZOFRAN)   As of today, STOP taking any Aspirin (unless otherwise instructed by your surgeon), NSAIDs such as meloxicam (MOBIC), Aleve, Naproxen, Ibuprofen, Motrin, Advil, Goody's, BC's, all herbal medications, fish oil, and all vitamins.              Special instructions:   [name]- Preparing For Surgery  Before surgery, you can play an important role. Because skin is not sterile, your skin needs to be as free of germs as possible. You can reduce the number of germs on your skin by washing with CHG (chlorahexidine gluconate) Soap before surgery.  CHG is an antiseptic cleaner which kills  germs and bonds with the skin to continue killing germs even after washing.    Oral Hygiene is also important to reduce your risk of infection.  Remember - BRUSH YOUR TEETH THE MORNING OF SURGERY WITH YOUR REGULAR TOOTHPASTE  Please do not use if you have an allergy to CHG or antibacterial soaps. If your skin becomes reddened/irritated stop using the CHG.  Do not shave (including legs and underarms) for at least 48 hours prior to first CHG shower. It is OK to shave your face.  Please follow these instructions carefully.   1. Shower the NIGHT BEFORE SURGERY and the MORNING OF SURGERY  2. If you chose to wash your hair, wash your hair first as usual with your normal shampoo.  3. After you shampoo, rinse your hair and body thoroughly to remove the shampoo.  4. Wash Face and genitals (private parts) with your normal soap.   5. Use CHG Soap as you would any other liquid soap. You can apply CHG directly to the skin and wash gently with a scrungie or a clean washcloth.   6. Apply the CHG Soap to your body ONLY FROM THE NECK DOWN.  Do not use on open wounds or open sores. Avoid contact with your eyes, ears, mouth and genitals (private parts). Wash Face and genitals (private parts)  with your normal soap.   7. Wash thoroughly, paying special attention to the area where your surgery will be performed.  8. Thoroughly rinse your  body with warm water from the neck down.  9. DO NOT shower/wash with your normal soap after using and rinsing off the CHG Soap.  10. Pat yourself dry with a CLEAN TOWEL.  11. Wear CLEAN PAJAMAS to bed the night before surgery.  12. Place CLEAN SHEETS on your bed the night before your surgery.  13. DO NOT SLEEP WITH PETS.   Day of Surgery: SHOWER with CHG soap. Brush your teeth WITH YOUR REGULAR TOOTHPASTE. Wear Clean/Comfortable clothing the morning of surgery. Do not apply any deodorants/lotions.   Do not wear jewelry, make up, or nail polish. Do not shave 48  hours prior to surgery.  Do not bring valuables to the hospital. [facility] is not responsible for any belongings or valuables.   Do NOT Smoke (Tobacco/Vaping) or drink Alcohol 24 hours prior to your procedure.  If you use a CPAP at night, you may bring all equipment for your overnight stay.   Contacts, glasses, or dentures may not be worn into surgery, please bring cases for these belongings.   For patients admitted to the hospital, discharge time will be determined by your treatment team.   Patients discharged the day of surgery will not be allowed to drive home, and someone needs to stay with them for 24 hours.    Please read over the following fact sheets that you were given.

## 2020-08-15 NOTE — Progress Notes (Signed)
PCP - Lorrene Reid, PA-C Cardiologist - Denies  PPM/ICD - Denies  Chest x-ray - 08/15/20 EKG - 08/15/20 Stress Test - Denies ECHO - Denies Cardiac Cath - Denies  Sleep Study - Denies  Patient denies being diabetic.  Blood Thinner Instructions: N/A Aspirin Instructions: N/A  ERAS Protcol - Yes PRE-SURGERY Ensure or G2- Ensure given  COVID TEST- 08/17/20   Anesthesia review: No  Patient denies shortness of breath, fever, cough and chest pain at PAT appointment   All instructions explained to the patient, with a verbal understanding of the material. Patient agrees to go over the instructions while at home for a better understanding. Patient also instructed to self quarantine after being tested for COVID-19. The opportunity to ask questions was provided.

## 2020-08-16 ENCOUNTER — Encounter: Payer: Self-pay | Admitting: Orthopaedic Surgery

## 2020-08-17 ENCOUNTER — Other Ambulatory Visit (HOSPITAL_COMMUNITY)
Admission: RE | Admit: 2020-08-17 | Discharge: 2020-08-17 | Disposition: A | Discharge status: Home / Self Care | Payer: No Typology Code available for payment source | Source: Ambulatory Visit | Attending: Orthopaedic Surgery | Admitting: Orthopaedic Surgery

## 2020-08-17 DIAGNOSIS — Z20822 Contact with and (suspected) exposure to covid-19: Secondary | ICD-10-CM | POA: Insufficient documentation

## 2020-08-17 DIAGNOSIS — Z01812 Encounter for preprocedural laboratory examination: Secondary | ICD-10-CM | POA: Insufficient documentation

## 2020-08-17 LAB — SARS CORONAVIRUS 2 (TAT 6-24 HRS): SARS Coronavirus 2: NEGATIVE

## 2020-08-18 ENCOUNTER — Telehealth: Payer: Self-pay

## 2020-08-18 ENCOUNTER — Other Ambulatory Visit: Payer: Self-pay

## 2020-08-18 DIAGNOSIS — M1612 Unilateral primary osteoarthritis, left hip: Secondary | ICD-10-CM

## 2020-08-18 MED ORDER — TRANEXAMIC ACID 1000 MG/10ML IV SOLN
2000.0000 mg | INTRAVENOUS | Status: DC
  Filled 2020-08-18 (×2): qty 20

## 2020-08-18 NOTE — Telephone Encounter (Signed)
Referral in chart for therapy following total hip on monday

## 2020-08-18 NOTE — Telephone Encounter (Signed)
Patient is having Surgery 08/21/2020.   [facility] called and would like to see if patient can do Outpatient PT instead of HHPT( states they are so slammed next week). States patient is young and would be good to do Outpatient Instead.

## 2020-08-18 NOTE — Telephone Encounter (Signed)
Yes can we go ahead and get her set up for that at our office.  Thanks.

## 2020-08-21 ENCOUNTER — Ambulatory Visit (HOSPITAL_COMMUNITY): Payer: No Typology Code available for payment source | Admitting: Anesthesiology

## 2020-08-21 ENCOUNTER — Ambulatory Visit (HOSPITAL_COMMUNITY): Payer: No Typology Code available for payment source

## 2020-08-21 ENCOUNTER — Encounter (HOSPITAL_COMMUNITY)
Admission: RE | Disposition: A | Discharge status: Home / Self Care | Payer: Self-pay | Source: Home / Self Care | Attending: Orthopaedic Surgery

## 2020-08-21 ENCOUNTER — Observation Stay (HOSPITAL_COMMUNITY)
Admission: RE | Admit: 2020-08-21 | Discharge: 2020-08-22 | Disposition: A | Discharge status: Home / Self Care | Payer: No Typology Code available for payment source | Attending: Orthopaedic Surgery | Admitting: Orthopaedic Surgery

## 2020-08-21 ENCOUNTER — Other Ambulatory Visit: Payer: Self-pay

## 2020-08-21 ENCOUNTER — Encounter (HOSPITAL_COMMUNITY): Payer: Self-pay | Admitting: Orthopaedic Surgery

## 2020-08-21 ENCOUNTER — Observation Stay (HOSPITAL_COMMUNITY): Payer: No Typology Code available for payment source

## 2020-08-21 DIAGNOSIS — E039 Hypothyroidism, unspecified: Secondary | ICD-10-CM | POA: Insufficient documentation

## 2020-08-21 DIAGNOSIS — Z419 Encounter for procedure for purposes other than remedying health state, unspecified: Secondary | ICD-10-CM

## 2020-08-21 DIAGNOSIS — Z87891 Personal history of nicotine dependence: Secondary | ICD-10-CM | POA: Diagnosis not present

## 2020-08-21 DIAGNOSIS — Z79899 Other long term (current) drug therapy: Secondary | ICD-10-CM | POA: Insufficient documentation

## 2020-08-21 DIAGNOSIS — M1612 Unilateral primary osteoarthritis, left hip: Principal | ICD-10-CM

## 2020-08-21 DIAGNOSIS — Z7982 Long term (current) use of aspirin: Secondary | ICD-10-CM | POA: Diagnosis not present

## 2020-08-21 DIAGNOSIS — Z96642 Presence of left artificial hip joint: Secondary | ICD-10-CM

## 2020-08-21 DIAGNOSIS — Z96649 Presence of unspecified artificial hip joint: Secondary | ICD-10-CM

## 2020-08-21 HISTORY — PX: TOTAL HIP ARTHROPLASTY: SHX124

## 2020-08-21 LAB — ABO/RH: ABO/RH(D): O POS

## 2020-08-21 SURGERY — ARTHROPLASTY, HIP, TOTAL, ANTERIOR APPROACH
Anesthesia: Spinal | Site: Hip | Laterality: Left

## 2020-08-21 MED ORDER — POVIDONE-IODINE 10 % EX SWAB
2.0000 "application " | Freq: Once | CUTANEOUS | Status: AC
  Administered 2020-08-21: 2 via TOPICAL

## 2020-08-21 MED ORDER — ONDANSETRON HCL 4 MG PO TABS: 4.0000 mg | ORAL_TABLET | Freq: Four times a day (QID) | ORAL | Status: DC | PRN

## 2020-08-21 MED ORDER — CHLORHEXIDINE GLUCONATE 0.12 % MT SOLN
OROMUCOSAL | Status: AC
  Administered 2020-08-21: 15 mL
  Filled 2020-08-21: qty 15

## 2020-08-21 MED ORDER — TRANEXAMIC ACID-NACL 1000-0.7 MG/100ML-% IV SOLN
INTRAVENOUS | Status: AC
  Filled 2020-08-21: qty 100

## 2020-08-21 MED ORDER — SODIUM CHLORIDE 0.9 % IV SOLN: INTRAVENOUS | Status: DC

## 2020-08-21 MED ORDER — VANCOMYCIN HCL 1 G IV SOLR
INTRAVENOUS | Status: DC | PRN
  Administered 2020-08-21: 1000 mg via TOPICAL

## 2020-08-21 MED ORDER — OXYCODONE HCL 5 MG PO TABS
5.0000 mg | ORAL_TABLET | ORAL | Status: DC | PRN
  Administered 2020-08-21 – 2020-08-22 (×2): 10 mg via ORAL
  Filled 2020-08-21 (×2): qty 2

## 2020-08-21 MED ORDER — ONDANSETRON HCL 4 MG/2ML IJ SOLN: 4.0000 mg | Freq: Four times a day (QID) | INTRAMUSCULAR | Status: DC | PRN

## 2020-08-21 MED ORDER — SORBITOL 70 % SOLN
30.0000 mL | Freq: Every day | Status: DC | PRN
  Filled 2020-08-21: qty 30

## 2020-08-21 MED ORDER — ACETAMINOPHEN 325 MG PO TABS: 325.0000 mg | ORAL_TABLET | Freq: Four times a day (QID) | ORAL | Status: DC | PRN

## 2020-08-21 MED ORDER — 0.9 % SODIUM CHLORIDE (POUR BTL) OPTIME
TOPICAL | Status: DC | PRN
  Administered 2020-08-21: 1000 mL

## 2020-08-21 MED ORDER — METOCLOPRAMIDE HCL 5 MG PO TABS
5.0000 mg | ORAL_TABLET | Freq: Three times a day (TID) | ORAL | Status: DC | PRN
Start: 2020-08-21 — End: 2020-08-22

## 2020-08-21 MED ORDER — PHENOL 1.4 % MT LIQD: 1.0000 | OROMUCOSAL | Status: DC | PRN

## 2020-08-21 MED ORDER — ASPIRIN 81 MG PO CHEW
81.0000 mg | CHEWABLE_TABLET | Freq: Two times a day (BID) | ORAL | Status: DC
  Administered 2020-08-21 – 2020-08-22 (×2): 81 mg via ORAL
  Filled 2020-08-21 (×2): qty 1

## 2020-08-21 MED ORDER — POLYETHYLENE GLYCOL 3350 17 G PO PACK
17.0000 g | PACK | Freq: Every day | ORAL | Status: DC
  Administered 2020-08-22: 17 g via ORAL
  Filled 2020-08-21: qty 1

## 2020-08-21 MED ORDER — IRRISEPT - 450ML BOTTLE WITH 0.05% CHG IN STERILE WATER, USP 99.95% OPTIME
TOPICAL | Status: DC | PRN
  Administered 2020-08-21: 450 mL

## 2020-08-21 MED ORDER — OXYCODONE HCL 5 MG PO TABS: 10.0000 mg | ORAL_TABLET | ORAL | Status: DC | PRN

## 2020-08-21 MED ORDER — MIDAZOLAM HCL 2 MG/2ML IJ SOLN
INTRAMUSCULAR | Status: AC
  Filled 2020-08-21: qty 2

## 2020-08-21 MED ORDER — LEVOTHYROXINE SODIUM 75 MCG PO TABS
75.0000 ug | ORAL_TABLET | Freq: Every day | ORAL | Status: DC
  Administered 2020-08-22: 75 ug via ORAL
  Filled 2020-08-21: qty 1

## 2020-08-21 MED ORDER — PROPOFOL 10 MG/ML IV BOLUS
INTRAVENOUS | Status: DC | PRN
  Administered 2020-08-21: 30 mg via INTRAVENOUS
  Administered 2020-08-21: 25 mg via INTRAVENOUS

## 2020-08-21 MED ORDER — MAGNESIUM CITRATE PO SOLN: 1.0000 | Freq: Once | ORAL | Status: DC | PRN

## 2020-08-21 MED ORDER — TRANEXAMIC ACID-NACL 1000-0.7 MG/100ML-% IV SOLN
1000.0000 mg | Freq: Once | INTRAVENOUS | Status: AC
  Administered 2020-08-21: 1000 mg via INTRAVENOUS
  Filled 2020-08-21: qty 100

## 2020-08-21 MED ORDER — ACETAMINOPHEN 10 MG/ML IV SOLN
INTRAVENOUS | Status: DC | PRN
  Administered 2020-08-21: 1000 mg via INTRAVENOUS

## 2020-08-21 MED ORDER — DOCUSATE SODIUM 100 MG PO CAPS
100.0000 mg | ORAL_CAPSULE | Freq: Two times a day (BID) | ORAL | Status: DC
  Administered 2020-08-21 – 2020-08-22 (×3): 100 mg via ORAL
  Filled 2020-08-21 (×3): qty 1

## 2020-08-21 MED ORDER — VANCOMYCIN HCL 1000 MG IV SOLR
INTRAVENOUS | Status: AC
  Filled 2020-08-21: qty 1000

## 2020-08-21 MED ORDER — BUPIVACAINE-MELOXICAM ER 400-12 MG/14ML IJ SOLN
INTRAMUSCULAR | Status: AC
  Filled 2020-08-21: qty 1

## 2020-08-21 MED ORDER — BUPIVACAINE IN DEXTROSE 0.75-8.25 % IT SOLN
INTRATHECAL | Status: DC | PRN
  Administered 2020-08-21: 1.6 mL via INTRATHECAL

## 2020-08-21 MED ORDER — ACETAMINOPHEN 10 MG/ML IV SOLN
INTRAVENOUS | Status: AC
  Filled 2020-08-21: qty 100

## 2020-08-21 MED ORDER — PROPOFOL 500 MG/50ML IV EMUL
INTRAVENOUS | Status: DC | PRN
  Administered 2020-08-21: 50 ug/kg/min via INTRAVENOUS

## 2020-08-21 MED ORDER — CEFAZOLIN SODIUM-DEXTROSE 2-4 GM/100ML-% IV SOLN
2.0000 g | Freq: Four times a day (QID) | INTRAVENOUS | Status: AC
  Administered 2020-08-21 (×2): 2 g via INTRAVENOUS
  Filled 2020-08-21 (×2): qty 100

## 2020-08-21 MED ORDER — TRANEXAMIC ACID-NACL 1000-0.7 MG/100ML-% IV SOLN
1000.0000 mg | INTRAVENOUS | Status: AC
  Administered 2020-08-21: 1000 mg via INTRAVENOUS

## 2020-08-21 MED ORDER — ONDANSETRON HCL 4 MG/2ML IJ SOLN
INTRAMUSCULAR | Status: DC | PRN
  Administered 2020-08-21: 4 mg via INTRAVENOUS

## 2020-08-21 MED ORDER — LACTATED RINGERS IV SOLN: INTRAVENOUS | Status: DC

## 2020-08-21 MED ORDER — ONDANSETRON HCL 4 MG/2ML IJ SOLN
INTRAMUSCULAR | Status: AC
  Filled 2020-08-21: qty 2

## 2020-08-21 MED ORDER — MENTHOL 3 MG MT LOZG: 1.0000 | LOZENGE | OROMUCOSAL | Status: DC | PRN

## 2020-08-21 MED ORDER — METHOCARBAMOL 500 MG PO TABS
ORAL_TABLET | ORAL | Status: AC
  Filled 2020-08-21: qty 1

## 2020-08-21 MED ORDER — CEFAZOLIN SODIUM-DEXTROSE 2-4 GM/100ML-% IV SOLN
INTRAVENOUS | Status: AC
  Filled 2020-08-21: qty 100

## 2020-08-21 MED ORDER — HYDROMORPHONE HCL 1 MG/ML IJ SOLN
INTRAMUSCULAR | Status: AC
  Filled 2020-08-21: qty 1

## 2020-08-21 MED ORDER — METHOCARBAMOL 1000 MG/10ML IJ SOLN
500.0000 mg | Freq: Four times a day (QID) | INTRAVENOUS | Status: DC | PRN
  Filled 2020-08-21: qty 5

## 2020-08-21 MED ORDER — ALBUMIN HUMAN 5 % IV SOLN: INTRAVENOUS | Status: DC | PRN

## 2020-08-21 MED ORDER — FENTANYL CITRATE (PF) 250 MCG/5ML IJ SOLN
INTRAMUSCULAR | Status: AC
  Filled 2020-08-21: qty 5

## 2020-08-21 MED ORDER — CEFAZOLIN SODIUM-DEXTROSE 2-4 GM/100ML-% IV SOLN
2.0000 g | INTRAVENOUS | Status: AC
  Administered 2020-08-21: 2 mg via INTRAVENOUS

## 2020-08-21 MED ORDER — BUPIVACAINE-MELOXICAM ER 400-12 MG/14ML IJ SOLN
INTRAMUSCULAR | Status: DC | PRN
  Administered 2020-08-21: 400 mg

## 2020-08-21 MED ORDER — FENTANYL CITRATE (PF) 100 MCG/2ML IJ SOLN
INTRAMUSCULAR | Status: DC | PRN
  Administered 2020-08-21 (×2): 100 ug via INTRAVENOUS
  Administered 2020-08-21: 50 ug via INTRAVENOUS

## 2020-08-21 MED ORDER — ALUM & MAG HYDROXIDE-SIMETH 200-200-20 MG/5ML PO SUSP: 30.0000 mL | ORAL | Status: DC | PRN

## 2020-08-21 MED ORDER — SODIUM CHLORIDE 0.9 % IR SOLN
Status: DC | PRN
  Administered 2020-08-21: 1000 mL

## 2020-08-21 MED ORDER — OXYCODONE HCL ER 10 MG PO T12A
10.0000 mg | EXTENDED_RELEASE_TABLET | Freq: Two times a day (BID) | ORAL | Status: DC
  Administered 2020-08-21 – 2020-08-22 (×3): 10 mg via ORAL
  Filled 2020-08-21 (×3): qty 1

## 2020-08-21 MED ORDER — HYDROXYZINE HCL 50 MG/ML IM SOLN
50.0000 mg | Freq: Four times a day (QID) | INTRAMUSCULAR | Status: DC | PRN
  Administered 2020-08-21: 50 mg via INTRAMUSCULAR
  Filled 2020-08-21: qty 1

## 2020-08-21 MED ORDER — ACETAMINOPHEN 500 MG PO TABS
1000.0000 mg | ORAL_TABLET | Freq: Four times a day (QID) | ORAL | Status: AC
  Administered 2020-08-21 – 2020-08-22 (×4): 1000 mg via ORAL
  Filled 2020-08-21 (×4): qty 2

## 2020-08-21 MED ORDER — HYDROMORPHONE HCL 1 MG/ML IJ SOLN: 0.5000 mg | INTRAMUSCULAR | Status: DC | PRN

## 2020-08-21 MED ORDER — DEXAMETHASONE SODIUM PHOSPHATE 10 MG/ML IJ SOLN
10.0000 mg | Freq: Once | INTRAMUSCULAR | Status: AC
  Administered 2020-08-22: 10 mg via INTRAVENOUS
  Filled 2020-08-21: qty 1

## 2020-08-21 MED ORDER — TRANEXAMIC ACID 1000 MG/10ML IV SOLN
INTRAVENOUS | Status: DC | PRN
  Administered 2020-08-21: 2000 mg via TOPICAL

## 2020-08-21 MED ORDER — HYDROMORPHONE HCL 1 MG/ML IJ SOLN
0.2500 mg | INTRAMUSCULAR | Status: DC | PRN
Start: 2020-08-21 — End: 2020-08-21
  Administered 2020-08-21 (×3): 0.5 mg via INTRAVENOUS

## 2020-08-21 MED ORDER — PHENYLEPHRINE HCL-NACL 10-0.9 MG/250ML-% IV SOLN
INTRAVENOUS | Status: DC | PRN
  Administered 2020-08-21: 50 ug/min via INTRAVENOUS

## 2020-08-21 MED ORDER — MIDAZOLAM HCL 5 MG/5ML IJ SOLN
INTRAMUSCULAR | Status: DC | PRN
  Administered 2020-08-21: 2 mg via INTRAVENOUS

## 2020-08-21 MED ORDER — DIPHENHYDRAMINE HCL 12.5 MG/5ML PO ELIX
25.0000 mg | ORAL_SOLUTION | ORAL | Status: DC | PRN
  Filled 2020-08-21: qty 10

## 2020-08-21 MED ORDER — METOCLOPRAMIDE HCL 5 MG/ML IJ SOLN: 5.0000 mg | Freq: Three times a day (TID) | INTRAMUSCULAR | Status: DC | PRN

## 2020-08-21 MED ORDER — METHOCARBAMOL 500 MG PO TABS
500.0000 mg | ORAL_TABLET | Freq: Four times a day (QID) | ORAL | Status: DC | PRN
  Administered 2020-08-21 – 2020-08-22 (×2): 500 mg via ORAL
  Filled 2020-08-21 (×2): qty 1

## 2020-08-21 SURGICAL SUPPLY — 64 items
BAG DECANTER FOR FLEXI CONT (MISCELLANEOUS) ×3 IMPLANT
BALL HIP CERAMIC 32MM PLUS 9 ×1 IMPLANT
CELLS DAT CNTRL 66122 CELL SVR (MISCELLANEOUS) ×1 IMPLANT
CLOSURE STERI-STRIP 1/2X4 (GAUZE/BANDAGES/DRESSINGS) ×1
CLSR STERI-STRIP ANTIMIC 1/2X4 (GAUZE/BANDAGES/DRESSINGS) ×2 IMPLANT
COVER PERINEAL POST (MISCELLANEOUS) ×3 IMPLANT
COVER SURGICAL LIGHT HANDLE (MISCELLANEOUS) ×3 IMPLANT
COVER WAND RF STERILE (DRAPES) ×3 IMPLANT
CUP SECTOR GRIPTON 50MM (Cup) ×3 IMPLANT
DRAPE C-ARM 42X72 X-RAY (DRAPES) ×3 IMPLANT
DRAPE POUCH INSTRU U-SHP 10X18 (DRAPES) ×3 IMPLANT
DRAPE STERI IOBAN 125X83 (DRAPES) ×3 IMPLANT
DRAPE U-SHAPE 47X51 STRL (DRAPES) ×6 IMPLANT
DRSG AQUACEL AG ADV 3.5X10 (GAUZE/BANDAGES/DRESSINGS) ×3 IMPLANT
DURAPREP 26ML APPLICATOR (WOUND CARE) ×6 IMPLANT
ELECT BLADE 4.0 EZ CLEAN MEGAD (MISCELLANEOUS) ×3
ELECT REM PT RETURN 9FT ADLT (ELECTROSURGICAL) ×3
ELECTRODE BLDE 4.0 EZ CLN MEGD (MISCELLANEOUS) ×1 IMPLANT
ELECTRODE REM PT RTRN 9FT ADLT (ELECTROSURGICAL) ×1 IMPLANT
GLOVE ECLIPSE 7.0 STRL STRAW (GLOVE) ×6 IMPLANT
GLOVE SKINSENSE NS SZ7.5 (GLOVE) ×2
GLOVE SKINSENSE STRL SZ7.5 (GLOVE) ×1 IMPLANT
GLOVE SURG SYN 7.5  E (GLOVE) ×12
GLOVE SURG SYN 7.5 E (GLOVE) ×4 IMPLANT
GLOVE SURG UNDER POLY LF SZ7 (GLOVE) ×3 IMPLANT
GOWN STRL REIN XL XLG (GOWN DISPOSABLE) ×3 IMPLANT
GOWN STRL REUS W/ TWL LRG LVL3 (GOWN DISPOSABLE) IMPLANT
GOWN STRL REUS W/ TWL XL LVL3 (GOWN DISPOSABLE) ×1 IMPLANT
GOWN STRL REUS W/TWL LRG LVL3 (GOWN DISPOSABLE)
GOWN STRL REUS W/TWL XL LVL3 (GOWN DISPOSABLE) ×3
HANDPIECE INTERPULSE COAX TIP (DISPOSABLE) ×3
HIP BALL CERAMIC 32MM PLUS 9 ×3 IMPLANT
HOOD PEEL AWAY FLYTE STAYCOOL (MISCELLANEOUS) ×6 IMPLANT
IV NS IRRIG 3000ML ARTHROMATIC (IV SOLUTION) ×3 IMPLANT
JET LAVAGE IRRISEPT WOUND (IRRIGATION / IRRIGATOR) ×3
KIT BASIN OR (CUSTOM PROCEDURE TRAY) ×3 IMPLANT
LAVAGE JET IRRISEPT WOUND (IRRIGATION / IRRIGATOR) ×1 IMPLANT
LINER ACET PNNCL PLUS4 NEUTRAL (Hips) ×1 IMPLANT
MARKER SKIN DUAL TIP RULER LAB (MISCELLANEOUS) ×3 IMPLANT
NEEDLE SPNL 18GX3.5 QUINCKE PK (NEEDLE) ×3 IMPLANT
PACK TOTAL JOINT (CUSTOM PROCEDURE TRAY) ×3 IMPLANT
PACK UNIVERSAL I (CUSTOM PROCEDURE TRAY) ×3 IMPLANT
PINNACLE PLUS 4 NEUTRAL (Hips) ×3 IMPLANT
RTRCTR WOUND ALEXIS 18CM MED (MISCELLANEOUS) ×3
SAW OSC TIP CART 19.5X105X1.3 (SAW) ×3 IMPLANT
SCREW 6.5MMX25MM (Screw) ×3 IMPLANT
SET HNDPC FAN SPRY TIP SCT (DISPOSABLE) ×1 IMPLANT
STAPLER VISISTAT 35W (STAPLE) IMPLANT
STEM FEM ACTIS STD SZ0 (Stem) ×3 IMPLANT
SUT ETHIBOND 2 V 37 (SUTURE) ×3 IMPLANT
SUT MON AB 3-0 SH 27 (SUTURE) ×3
SUT MON AB 3-0 SH27 (SUTURE) ×1 IMPLANT
SUT VIC AB 0 CT1 27 (SUTURE) ×3
SUT VIC AB 0 CT1 27XBRD ANBCTR (SUTURE) ×1 IMPLANT
SUT VIC AB 1 CTX 36 (SUTURE) ×3
SUT VIC AB 1 CTX36XBRD ANBCTR (SUTURE) ×1 IMPLANT
SUT VIC AB 2-0 CT1 27 (SUTURE) ×15
SUT VIC AB 2-0 CT1 TAPERPNT 27 (SUTURE) ×5 IMPLANT
SYR 50ML LL SCALE MARK (SYRINGE) ×3 IMPLANT
TOWEL GREEN STERILE (TOWEL DISPOSABLE) ×3 IMPLANT
TRAY CATH 16FR W/PLASTIC CATH (SET/KITS/TRAYS/PACK) IMPLANT
TRAY FOLEY W/BAG SLVR 16FR (SET/KITS/TRAYS/PACK) ×3
TRAY FOLEY W/BAG SLVR 16FR ST (SET/KITS/TRAYS/PACK) ×1 IMPLANT
YANKAUER SUCT BULB TIP NO VENT (SUCTIONS) ×3 IMPLANT

## 2020-08-21 NOTE — Transfer of Care (Signed)
Immediate Anesthesia Transfer of Care Note  Patient: Vernica Wachtel  Procedure(s) Performed: LEFT TOTAL HIP ARTHROPLASTY ANTERIOR APPROACH (Left: Hip)  Patient Location: PACU  Anesthesia Type:MAC  Level of Consciousness: drowsy and patient cooperative  Airway & Oxygen Therapy: Patient Spontanous Breathing and Patient connected to face mask oxygen  Post-op Assessment: Report given to RN and Post -op Vital signs reviewed and stable  Post vital signs: Reviewed and stable  Last Vitals:  Vitals Value Taken Time  BP 119/81 08/21/20 1240  Temp 36.5 C 08/21/20 1240  Pulse 76 08/21/20 1245  Resp 12 08/21/20 1245  SpO2 99 % 08/21/20 1245  Vitals shown include unvalidated device data.  Last Pain:  Vitals:   08/21/20 1240  TempSrc:   PainSc: 7       Patients Stated Pain Goal: 2 (94/70/96 2836)  Complications: No notable events documented.

## 2020-08-21 NOTE — Op Note (Signed)
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 Total hip replacement; Left hip; uncemented [id number]   Surgeon: Frankey Shown, M.D.  Assist: Madalyn Rob, PA-C   Anesthesia: spinal  Prosthesis: Depuy Acetabulum: Pinnacle 50 mm Femur: Actis 0 STD Head: 32 mm size: +9 Liner: +4 Bearing Type: ceramic/poly  Total Hip Arthroplasty (Anterior Approach) Op Note:  After informed consent was obtained and the operative extremity marked in the holding area, the patient was brought back to the operating room and placed supine on the HANA table. Next, the operative extremity was prepped and draped in normal sterile fashion. Surgical timeout occurred verifying patient identification, surgical site, surgical procedure and administration of antibiotics.  A modified anterior Smith-Peterson approach to the hip was performed, using the interval between tensor fascia lata and sartorius.  Dissection was carried bluntly down onto the anterior hip capsule. The lateral femoral circumflex vessels were identified and coagulated. A capsulotomy was performed and there was a large effusion and synovitis and the capsular flaps tagged for later repair.  The neck osteotomy was performed. The femoral head was removed which showed degenerative wear, the acetabular rim was cleared of soft tissue and attention was turned to reaming the acetabulum.  Sequential reaming was performed under fluoroscopic guidance. We reamed to a size 49 mm, and then impacted the acetabular shell. A 25 mm cancellous screw was placed through the shell for added fixation.  The liner was then placed after irrigation and attention turned to the femur.  After placing the femoral hook, the leg was taken to externally rotated, extended and adducted position taking care to perform soft tissue  releases to allow for adequate mobilization of the femur. Soft tissue was cleared from the shoulder of the greater trochanter and the hook elevator used to improve exposure of the proximal femur. Sequential broaching performed up to a size 0.  She had excellent bone quality. Trial neck and head were placed. The leg was brought back up to neutral and the construct reduced.  Antibiotic irrigation was placed in the surgical wound and kept for at least 1 minute.  The position and sizing of components, offset and leg lengths were checked using fluoroscopy. Stability of the construct was checked in extension and external rotation without any subluxation or impingement of prosthesis. We dislocated the prosthesis, dropped the leg back into position, removed trial components, and irrigated copiously. The final stem and head was then placed, the leg brought back up, the system reduced and fluoroscopy used to verify positioning.  We irrigated, obtained hemostasis and closed the capsule using #2 ethibond suture.  One gram of vancomycin powder was placed in the surgical bed.   One gram of topical tranexamic acid was injected into the joint.  The fascia was closed with #1 vicryl plus, the deep fat layer was closed with 0 vicryl, the subcutaneous layers closed with 2.0 Vicryl Plus and the skin closed with 3.0 monocryl. A sterile dressing was applied. The patient was awakened in the operating room and taken to recovery in stable condition.  All sponge, needle, and instrument counts were correct at the end of the case.   Tawanna Cooler, my PA, was a medical necessity for opening, closing, limb positioning, retracting, exposing, and overall facilitation and timely completion of the surgery.  Position: supine  Complications: see description of procedure.  Time Out: performed   Drains/Packing: none  Estimated blood loss: see anesthesia record  Returned to Recovery Room: in good condition.   Antibiotics: yes    Mechanical VTE (DVT) Prophylaxis: sequential compression devices, TED thigh-high  Chemical VTE (DVT) Prophylaxis: aspirin   Fluid Replacement: see anesthesia record  Specimens Removed: 1 to pathology   Sponge and Instrument Count Correct? yes   PACU: portable radiograph - low AP   Plan/RTC: Return in 2 weeks for staple removal. Weight Bearing/Load Lower Extremity: full  Hip precautions: none Suture Removal: 2 weeks   N. Eduard Roux, MD [facility] 11:40 AM   Implant Name Type Inv. Item Serial No. Manufacturer Lot No. LRB No. Used Action  CUP SECTOR GRIPTON 50MM - [id number] Cup CUP SECTOR GRIPTON 50MM  [employer] [id number] Left 1 Implanted  SCREW 6.5MMX25MM - [id number] Screw SCREW 6.5MMX25MM  [employer] [id number] Left 1 Implanted  PINNACLE PLUS 4 NEUTRAL - [id number] Hips PINNACLE PLUS 4 NEUTRAL  [employer] [id number] Left 1 Implanted  STEM FEM ACTIS STD SZ0 - [id number] Stem STEM FEM ACTIS STD SZ0  [employer] [id number] Left 1 Implanted  HIP BALL CERAMIC 32MM PLUS 9 - [id number]  HIP BALL CERAMIC 32MM PLUS 9  [employer] [id number] Left 1 Implanted

## 2020-08-21 NOTE — Anesthesia Preprocedure Evaluation (Addendum)
Anesthesia Evaluation  Patient identified by MRN, date of birth, ID band Patient awake    Reviewed: Allergy & Precautions, H&P , NPO status , Patient's Chart, lab work & pertinent test results  Airway Mallampati: II  TM Distance: >3 FB Neck ROM: Full    Dental no notable dental hx. (+) Teeth Intact, Dental Advisory Given   Pulmonary neg pulmonary ROS, former smoker,    Pulmonary exam normal breath sounds clear to auscultation       Cardiovascular negative cardio ROS   Rhythm:Regular Rate:Normal     Neuro/Psych  Headaches, negative psych ROS   GI/Hepatic Neg liver ROS, GERD  Medicated,  Endo/Other  Hypothyroidism   Renal/GU negative Renal ROS  negative genitourinary   Musculoskeletal  (+) Arthritis ,   Abdominal   Peds  Hematology negative hematology ROS (+)   Anesthesia Other Findings   Reproductive/Obstetrics negative OB ROS                            Anesthesia Physical Anesthesia Plan  ASA: 2  Anesthesia Plan: Spinal   Post-op Pain Management:    Induction: Intravenous  PONV Risk Score and Plan: 3 and Propofol infusion, Midazolam and Ondansetron  Airway Management Planned: Simple Face Mask  Additional Equipment:   Intra-op Plan:   Post-operative Plan:   Informed Consent: I have reviewed the patients History and Physical, chart, labs and discussed the procedure including the risks, benefits and alternatives for the proposed anesthesia with the patient or authorized representative who has indicated his/her understanding and acceptance.     Dental advisory given  Plan Discussed with: CRNA  Anesthesia Plan Comments:         Anesthesia Quick Evaluation

## 2020-08-21 NOTE — H&P (Signed)
PREOPERATIVE H&P  Chief Complaint: LEFT HIP DEGENERATIVE JOINT DISEASE  HPI: [name] is a 55 y.o. female who presents for surgical treatment of LEFT HIP DEGENERATIVE JOINT DISEASE.  She denies any changes in medical history.  Past Medical History:  Diagnosis Date   Generalized headaches    GERD (gastroesophageal reflux disease)    Hypothyroidism    Thyroid disease    hypo   Past Surgical History:  Procedure Laterality Date   ABDOMINAL HYSTERECTOMY N/A    [employer] 08/30/2019   APPENDECTOMY  2003   CESAREAN SECTION  2005   CESAREAN SECTION N/A    [employer]rees[employer] 08/30/2019   CHOLECYSTECTOMY     DIAGNOSTIC LAPAROSCOPY     Lap chole.   ROBOTIC ASSISTED TOTAL HYSTERECTOMY Bilateral 09/29/2012   Procedure: ROBOTIC ASSISTED TOTAL HYSTERECTOMY WITH BILATERAL SALPINGECTOMY;  Surgeon: Marvene Staff, MD;  Location: [facility];  Service: Gynecology;  Laterality: Bilateral;  3 hrs.   TONSILLECTOMY     TONSILLECTOMY  1969   Social History   Socioeconomic History   Marital status: Married    Spouse name: Jewel Baize   Number of children: 1   Years of education: Not on file   Highest education level: Bachelor's degree (e.g., BA, AB, BS)  Occupational History   Occupation: document Games developer: [employer]  Tobacco Use   Smoking status: Former    Packs/day: 0.25    Pack years: 0.00    Types: Cigarettes    Quit date: 03/11/2013    Years since quitting: 7.4   Smokeless tobacco: Never  Vaping Use   Vaping Use: Never used  Substance and Sexual Activity   Alcohol use: Yes    Comment: rare   Drug use: No   Sexual activity: Yes    Birth control/protection: None  Other Topics Concern   Not on file  Social History Narrative   Not on file   Social Determinants of Health   Financial Resource Strain: Not on file  Food Insecurity: Not on file  Transportation Needs: Not on file  Physical Activity: Not on file  Stress: Not on file  Social Connections: Not on  file   Family History  Problem Relation Age of Onset   Stroke Father    Transient ischemic attack Father    Heart disease Mother    COPD Mother    Cancer Mother    Breast cancer Mother    Heart attack Mother    Diabetes Mother    Breast cancer Maternal Aunt    Breast cancer Maternal Grandmother    Diabetes Paternal Grandmother    Allergies  Allergen Reactions   Succinylcholine Rash   Prior to Admission medications   Medication Sig Start Date End Date Taking? Authorizing Provider  acetaminophen (TYLENOL) 500 MG tablet Take 1,000 mg by mouth every 6 (six) hours as needed for moderate pain.   Yes [provider]  atorvastatin (LIPITOR) 10 MG tablet TAKE 1 TABLET BY MOUTH EVERY DAY Patient taking differently: Take 10 mg by mouth daily. 06/22/20  Yes Abonza, Herb Grays, PA-C  Cholecalciferol (VITAMIN D3) 3000 UNITS TABS Take 3,000 Units by mouth daily.   Yes [provider]  Coenzyme Q10 200 MG capsule Take 200 mg by mouth daily.   Yes [provider]  ibuprofen (ADVIL) 200 MG tablet Take 400 mg by mouth every 6 (six) hours as needed for moderate pain.   Yes [provider]  Multiple Vitamin (MULTIVITAMIN PO)  Take 1 tablet by mouth daily.   Yes [provider]  omeprazole (PRILOSEC) 20 MG capsule Take 20 mg by mouth daily as needed (acid reflux).   Yes [provider]  SYNTHROID 75 MCG tablet Take 1 tablet (75 mcg total) by mouth daily. 07/24/20  Yes Lorrene Reid, PA-C  aspirin EC 81 MG tablet Take 1 tablet (81 mg total) by mouth 2 (two) times daily. To be taken after surgery 08/14/20   Aundra Dubin, PA-C  docusate sodium (COLACE) 100 MG capsule Take 1 capsule (100 mg total) by mouth daily as needed. 08/14/20 08/14/21  Aundra Dubin, PA-C  meloxicam (MOBIC) 15 MG tablet Take 1 tablet (15 mg total) by mouth daily with breakfast. Patient taking differently: Take 15 mg by mouth daily as needed for pain. 02/24/20   Trixie Dredge, PA-C  methocarbamol (ROBAXIN) 500 MG tablet Take 1 tablet (500 mg total) by mouth 2 (two) times daily as needed. To be taken after surgery 08/14/20   Aundra Dubin, PA-C  nitrofurantoin, macrocrystal-monohydrate, (MACROBID) 100 MG capsule Take 100 mg by mouth 2 (two) times daily. Patient not taking: Reported on 08/08/2020 05/13/20   [provider]  ondansetron (ZOFRAN) 4 MG tablet Take 1 tablet (4 mg total) by mouth every 8 (eight) hours as needed for nausea or vomiting. 08/14/20   Aundra Dubin, PA-C  oxyCODONE-acetaminophen (PERCOCET) 5-325 MG tablet Take 1-2 tablets by mouth every 6 (six) hours as needed. To be taken after surgery 08/14/20   Aundra Dubin, PA-C     Positive ROS: All other systems have been reviewed and were otherwise negative with the exception of those mentioned in the HPI and as above.  Physical Exam: General: Alert, no acute distress Cardiovascular: No pedal edema Respiratory: No cyanosis, no use of accessory musculature GI: abdomen soft Skin: No lesions in the area of chief complaint Neurologic: Sensation intact distally Psychiatric: Patient is competent for consent with normal mood and affect Lymphatic: no lymphedema  MUSCULOSKELETAL: exam stable  Assessment: LEFT HIP DEGENERATIVE JOINT DISEASE  Plan: Plan for Procedure(s): LEFT TOTAL HIP ARTHROPLASTY ANTERIOR APPROACH  The risks benefits and alternatives were discussed with the patient including but not limited to the risks of nonoperative treatment, versus surgical intervention including infection, bleeding, nerve injury,  blood clots, cardiopulmonary complications, morbidity, mortality, among others, and they were willing to proceed.   Preoperative templating of the joint replacement has been completed, documented, and submitted to the Operating Room personnel in order to optimize intra-operative equipment management.   Eduard Roux, MD 08/21/2020 8:53 AM

## 2020-08-21 NOTE — Evaluation (Signed)
Physical Therapy Evaluation Patient Details Name: [name] MRN: [id number] DOB: 06/11/1965 Today's Date: 08/21/2020   History of Present Illness  Pt is a 55 y/o female s/p L THA, direct anterior on 6/13.PMH - hypothyroid  Clinical Impression  Pt is s/p surgery above with deficits below. Pt requiring min guard A to stand and transfer to chair this session. Further mobility limited secondary to nausea/vomiting. Anticipate pt will progress well once feeling better. Will continue to follow acutely.     Follow Up Recommendations Follow surgeon's recommendation for DC plan and follow-up therapies    Equipment Recommendations  None recommended by PT    Recommendations for Other Services       Precautions / Restrictions Precautions Precautions: Fall Restrictions Weight Bearing Restrictions: Yes LLE Weight Bearing: Weight bearing as tolerated      Mobility  Bed Mobility Overal bed mobility: Needs Assistance Bed Mobility: Supine to Sit     Supine to sit: Min assist     General bed mobility comments: Min A for LLE assist. Increased time required.    Transfers Overall transfer level: Needs assistance Equipment used: Rolling walker (2 wheeled) Transfers: Sit to/from [facility] Sit to Stand: Min guard Stand pivot transfers: Min guard       General transfer comment: Min guard for safety to stand and transfer to chair. Cues for hand placement. Pt reporting increased nausea and had episode of vomiting after transfer, so further mobility deferred.  Ambulation/Gait                Stairs            Wheelchair Mobility    Modified Rankin (Stroke Patients Only)       Balance Overall balance assessment: Needs assistance Sitting-balance support: No upper extremity supported;Feet supported Sitting balance-Leahy Scale: Fair     Standing balance support: Bilateral upper extremity supported;During functional activity Standing balance-Leahy  Scale: Poor Standing balance comment: Reliant on BUE support                             Pertinent Vitals/Pain Pain Assessment: Faces Faces Pain Scale: Hurts little more Pain Location: L hip Pain Descriptors / Indicators: Aching;Operative site guarding Pain Intervention(s): Monitored during session;Limited activity within patient's tolerance;Repositioned    Home Living Family/patient expects to be discharged to:: Private residence Living Arrangements: Spouse/significant other Available Help at Discharge: Family Type of Home: House Home Access: Stairs to enter Entrance Stairs-Rails: None Entrance Stairs-Number of Steps: 3 Home Layout: One level Home Equipment: Environmental consultant - 2 wheels;Bedside commode      Prior Function Level of Independence: Independent               Hand Dominance        Extremity/Trunk Assessment   Upper Extremity Assessment Upper Extremity Assessment: Overall WFL for tasks assessed    Lower Extremity Assessment Lower Extremity Assessment: LLE deficits/detail LLE Deficits / Details: Deficits consistent with post op pain and weakness.    Cervical / Trunk Assessment Cervical / Trunk Assessment: Normal  Communication   Communication: No difficulties  Cognition Arousal/Alertness: Awake/alert Behavior During Therapy: WFL for tasks assessed/performed Overall Cognitive Status: Within Functional Limits for tasks assessed                                        General Comments General  comments (skin integrity, edema, etc.): Pt's husband present during session    Exercises     Assessment/Plan    PT Assessment Patient needs continued PT services  PT Problem List Decreased strength;Decreased range of motion;Decreased activity tolerance;Decreased balance;Decreased mobility;Decreased knowledge of use of DME;Decreased knowledge of precautions;Pain       PT Treatment Interventions Gait training;DME instruction;Functional  mobility training;Stair training;Therapeutic activities;Therapeutic exercise;Balance training;Patient/family education    PT Goals (Current goals can be found in the Care Plan section)  Acute Rehab PT Goals Patient Stated Goal: to feel better PT Goal Formulation: With patient Time For Goal Achievement: 09/04/20 Potential to Achieve Goals: Good    Frequency 7X/week   Barriers to discharge        Co-evaluation               AM-PAC PT "6 Clicks" Mobility  Outcome Measure Help needed turning from your back to your side while in a flat bed without using bedrails?: A Little Help needed moving from lying on your back to sitting on the side of a flat bed without using bedrails?: A Little Help needed moving to and from a bed to a chair (including a wheelchair)?: A Little Help needed standing up from a chair using your arms (e.g., wheelchair or bedside chair)?: A Little Help needed to walk in hospital room?: A Little Help needed climbing 3-5 steps with a railing? : A Lot 6 Click Score: 17    End of Session Equipment Utilized During Treatment: Gait belt Activity Tolerance: Treatment limited secondary to medical complications (Comment) (nausea/vomiting) Patient left: in chair;with call bell/phone within reach Nurse Communication: Mobility status PT Visit Diagnosis: Other abnormalities of gait and mobility (R26.89);Pain Pain - Right/Left: Left Pain - part of body: Hip    Time: 2947-6546 PT Time Calculation (min) (ACUTE ONLY): 19 min   Charges:   PT Evaluation $PT Eval Low Complexity: 1 Low          Lou Miner, DPT  Acute Rehabilitation Services  Pager: [phone] Office: [phone]   Rudean Hitt 08/21/2020, 4:48 PM

## 2020-08-21 NOTE — Discharge Instructions (Signed)

## 2020-08-21 NOTE — Anesthesia Postprocedure Evaluation (Signed)
Anesthesia Post Note  Patient: Lashell Moffitt  Procedure(s) Performed: LEFT TOTAL HIP ARTHROPLASTY ANTERIOR APPROACH (Left: Hip)     Patient location during evaluation: PACU Anesthesia Type: Spinal Level of consciousness: oriented and awake and alert Pain management: pain level controlled Vital Signs Assessment: post-procedure vital signs reviewed and stable Respiratory status: spontaneous breathing and respiratory function stable Cardiovascular status: blood pressure returned to baseline and stable Postop Assessment: no headache, no backache, no apparent nausea or vomiting, spinal receding and patient able to bend at knees Anesthetic complications: no   No notable events documented.  Last Vitals:  Vitals:   08/21/20 1330 08/21/20 1353  BP: 117/75 (!) 141/79  Pulse: 63 69  Resp: 12 20  Temp: (!) 36.3 C 36.6 C  SpO2: 96% 97%    Last Pain:  Vitals:   08/21/20 1353  TempSrc: Oral  PainSc:                  Jalen Oberry,W. EDMOND

## 2020-08-21 NOTE — Anesthesia Procedure Notes (Signed)
Spinal  Patient location during procedure: OR Start time: 08/21/2020 9:56 AM End time: 08/21/2020 10:01 AM Reason for block: surgical anesthesia Staffing Performed: anesthesiologist  Anesthesiologist: Roderic Palau, MD Preanesthetic Checklist Completed: patient identified, IV checked, risks and benefits discussed, surgical consent, monitors and equipment checked, pre-op evaluation and timeout performed Spinal Block Patient position: sitting Prep: DuraPrep Patient monitoring: cardiac monitor, continuous pulse ox and blood pressure Approach: midline Location: L3-4 Injection technique: single-shot Needle Needle type: Pencan  Needle gauge: 24 G Needle length: 9 cm Assessment Sensory level: T8 Events: CSF return Additional Notes Functioning IV was confirmed and monitors were applied. Sterile prep and drape, including hand hygiene and sterile gloves were used. The patient was positioned and the spine was prepped. The skin was anesthetized with lidocaine.  Free flow of clear CSF was obtained prior to injecting local anesthetic into the CSF.  The spinal needle aspirated freely following injection.  The needle was carefully withdrawn.  The patient tolerated the procedure well.

## 2020-08-22 ENCOUNTER — Encounter (HOSPITAL_COMMUNITY): Payer: Self-pay | Admitting: Orthopaedic Surgery

## 2020-08-22 DIAGNOSIS — M1612 Unilateral primary osteoarthritis, left hip: Secondary | ICD-10-CM | POA: Diagnosis not present

## 2020-08-22 LAB — BASIC METABOLIC PANEL
Anion gap: 6 (ref 5–15)
BUN: 12 mg/dL (ref 6–20)
CO2: 27 mmol/L (ref 22–32)
Calcium: 8.7 mg/dL — ABNORMAL LOW (ref 8.9–10.3)
Chloride: 101 mmol/L (ref 98–111)
Creatinine, Ser: 0.81 mg/dL (ref 0.44–1.00)
GFR, Estimated: >60 mL/min (ref >60)
Glucose, Bld: 128 mg/dL — ABNORMAL HIGH (ref 70–99)
Potassium: 3.4 mmol/L — ABNORMAL LOW (ref 3.5–5.1)
Sodium: 134 mmol/L — ABNORMAL LOW (ref 135–145)

## 2020-08-22 LAB — CBC
HCT: 32.9 % — ABNORMAL LOW (ref 36.0–46.0)
Hemoglobin: 11.4 g/dL — ABNORMAL LOW (ref 12.0–15.0)
MCH: 30.6 pg (ref 26.0–34.0)
MCHC: 34.7 g/dL (ref 30.0–36.0)
MCV: 88.2 fL (ref 80.0–100.0)
Platelets: 139 10*3/uL — ABNORMAL LOW (ref 150–400)
RBC: 3.73 MIL/uL — ABNORMAL LOW (ref 3.87–5.11)
RDW: 12.3 % (ref 11.5–15.5)
WBC: 11 10*3/uL — ABNORMAL HIGH (ref 4.0–10.5)
nRBC: 0 % (ref 0.0–0.2)

## 2020-08-22 NOTE — Progress Notes (Signed)
Subjective: 1 Day Post-Op Procedure(s) (LRB): LEFT TOTAL HIP ARTHROPLASTY ANTERIOR APPROACH (Left) Patient reports pain as mild.  C/o nausea/vomiting overnight. Feeling better this am. No other complaints  Objective: Vital signs in last 24 hours: Temp:  [97.4 F (36.3 C)-99.7 F (37.6 C)] 99.7 F (37.6 C) (06/14 0708) Pulse Rate:  [60-99] 94 (06/14 0708) Resp:  [9-20] 18 (06/14 0708) BP: (102-153)/(61-90) 123/63 (06/14 0708) SpO2:  [94 %-100 %] 94 % (06/14 0708) Weight:  [79.4 kg] 79.4 kg (06/13 0822)  Intake/Output from previous day: 06/13 0701 - 06/14 0700 In: 2490 [P.O.:240; I.V.:1800; IV Piggyback:450] Out: 2000 [Emesis/NG output:500; Stool:900; Blood:600] Intake/Output this shift: No intake/output data recorded.  Recent Labs    08/22/20 0624  HGB 11.4*   Recent Labs    08/22/20 0624  WBC 11.0*  RBC 3.73*  HCT 32.9*  PLT 139*   Recent Labs    08/22/20 0624  NA 134*  K 3.4*  CL 101  CO2 27  BUN 12  CREATININE 0.81  GLUCOSE 128*  CALCIUM 8.7*   No results for input(s): LABPT, INR in the last 72 hours.  Neurologically intact Neurovascular intact Sensation intact distally Intact pulses distally Dorsiflexion/Plantar flexion intact Incision: dressing C/D/I No cellulitis present Compartment soft   Assessment/Plan: 1 Day Post-Op Procedure(s) (LRB): LEFT TOTAL HIP ARTHROPLASTY ANTERIOR APPROACH (Left) Advance diet Up with therapy D/C IV fluids Discharge home with home health after second PT session as long as she mobilizes well WBAT LLE ABLA- mild and stable D/c rx called in to pharmacy last week      [name] 08/22/2020, 8:18 AM

## 2020-08-22 NOTE — Progress Notes (Signed)
Physical Therapy Treatment Patient Details Name: [name] MRN: [id number] DOB: [birth year] Today's Date: 08/22/2020    History of Present Illness Pt is a 55 y/o female s/p L THA, direct anterior on 6/13.PMH - hypothyroid    PT Comments    Patient progressing well with mobility today. Reports no nausea today and pain is controlled. Tolerated transfers and gait training with supervision and use of RW for support. Emphasized importance of step through gait and proper gait mechanics with use of RW. Instructed pt in there ex. Will plan for stair training and HEP handout in PM session prior to d/c. Will follow.   Follow Up Recommendations  Follow surgeon's recommendation for DC plan and follow-up therapies (really interested in HHPT)     Equipment Recommendations  None recommended by PT    Recommendations for Other Services       Precautions / Restrictions Precautions Precautions: Fall Restrictions Weight Bearing Restrictions: Yes LLE Weight Bearing: Weight bearing as tolerated    Mobility  Bed Mobility Overal bed mobility: Needs Assistance Bed Mobility: Supine to Sit     Supine to sit: Modified independent (Device/Increase time)     General bed mobility comments: HOB flat, use of rails to get to EOB with increased time and using UEs to bring LLE to EOB.    Transfers Overall transfer level: Needs assistance Equipment used: Rolling walker (2 wheeled) Transfers: Sit to/from Stand Sit to Stand: Supervision         General transfer comment: Supervision for safety, Stood from EOB x1, cues for hand placement. Stood from toilet x1, transferred to chair post ambulation.  Ambulation/Gait Ambulation/Gait assistance: Supervision Gait Distance (Feet): 150 Feet Assistive device: Rolling walker (2 wheeled) Gait Pattern/deviations: Step-to pattern;Step-through pattern;Decreased stance time - left;Decreased step length - right Gait velocity: decreased   General Gait Details:  Initially step to gait with RW too far anterior progressing to step through gait with better RW management/positioning. Decreased knee flexion during swing on left.   Stairs             Wheelchair Mobility    Modified Rankin (Stroke Patients Only)       Balance Overall balance assessment: Needs assistance Sitting-balance support: No upper extremity supported;Feet supported Sitting balance-Leahy Scale: Fair     Standing balance support: During functional activity Standing balance-Leahy Scale: Poor Standing balance comment: Reliant on BUE support; able to lean on sink to wash hands without difficulty.                            Cognition Arousal/Alertness: Awake/alert Behavior During Therapy: WFL for tasks assessed/performed Overall Cognitive Status: Within Functional Limits for tasks assessed                                        Exercises Total Joint Exercises Ankle Circles/Pumps: AROM;Both;10 reps;Seated Quad Sets: AROM;Both;10 reps;Seated Towel Squeeze: AROM;Both;10 reps;Seated Heel Slides: AROM;Left;10 reps;Seated Hip ABduction/ADduction: AROM;AAROM;Left;10 reps;Seated Long Arc Quad: AROM;Left;10 reps;Seated    General Comments General comments (skin integrity, edema, etc.): No nausea today.      Pertinent Vitals/Pain Pain Assessment: 0-10 Pain Score: 6  Pain Location: L hip Pain Descriptors / Indicators: Aching;Operative site guarding Pain Intervention(s): Monitored during session;Repositioned;Ice applied    Home Living  Prior Function            PT Goals (current goals can now be found in the care plan section) Progress towards PT goals: Progressing toward goals    Frequency    7X/week      PT Plan Current plan remains appropriate    Co-evaluation              AM-PAC PT "6 Clicks" Mobility   Outcome Measure  Help needed turning from your back to your side while in a flat  bed without using bedrails?: None Help needed moving from lying on your back to sitting on the side of a flat bed without using bedrails?: None Help needed moving to and from a bed to a chair (including a wheelchair)?: A Little Help needed standing up from a chair using your arms (e.g., wheelchair or bedside chair)?: A Little Help needed to walk in hospital room?: A Little Help needed climbing 3-5 steps with a railing? : A Little 6 Click Score: 20    End of Session Equipment Utilized During Treatment: Gait belt Activity Tolerance: Patient tolerated treatment well Patient left: in chair;with call bell/phone within reach Nurse Communication: Mobility status PT Visit Diagnosis: Other abnormalities of gait and mobility (R26.89);Pain Pain - Right/Left: Left Pain - part of body: Hip     Time: 0459-9774 PT Time Calculation (min) (ACUTE ONLY): 30 min  Charges:  $Gait Training: 8-22 mins $Therapeutic Activity: 8-22 mins                     Marisa Severin, PT, DPT Acute Rehabilitation Services Pager [phone] Office [place] 08/22/2020, 8:26 AM

## 2020-08-22 NOTE — Progress Notes (Signed)
Physical Therapy Treatment Patient Details Name: [name] MRN: [id number] DOB: 11-Nov-1965 Today's Date: 08/22/2020    History of Present Illness Pt is a 55 y/o female s/p L THA, direct anterior on 6/13.PMH - hypothyroid    PT Comments    Patient progressing well this afternoon. Reports feeling sleepy from pain medication. This session focused on stair training, gait training and HEP handout. Able to perform stair negotiation with Min A (HHA) to simulate husband's assist at home. Reviewed standing exercises and HEP handout. Discussed car transfer and tub transfer for home. Pt eager to return home. Will have support from family.   Follow Up Recommendations  Follow surgeon's recommendation for DC plan and follow-up therapies     Equipment Recommendations  None recommended by PT    Recommendations for Other Services       Precautions / Restrictions Precautions Precautions: None Restrictions Weight Bearing Restrictions: Yes LLE Weight Bearing: Weight bearing as tolerated    Mobility  Bed Mobility               General bed mobility comments: Sitting in chair upon PT arrival.    Transfers Overall transfer level: Needs assistance Equipment used: Rolling walker (2 wheeled);None Transfers: Sit to/from Stand Sit to Stand: Supervision         General transfer comment: Supervision for safety, Stood from EOB x1, Stood from toilet x1, transferred to chair post ambulation.  Ambulation/Gait Ambulation/Gait assistance: Supervision;Modified independent (Device/Increase time) Gait Distance (Feet): 400 Feet Assistive device: Rolling walker (2 wheeled) Gait Pattern/deviations: Step-through pattern;Decreased stride length;Antalgic Gait velocity: decreased   General Gait Details: Slow, steady gait with RW for support; better gait mechanics this afternoon   Stairs Stairs: Yes Stairs assistance: Min assist Stair Management: Forwards;Step to pattern Number of Stairs:  4 General stair comments: Use of therapist to simulate husband for HHA to ascend/descend steps. Min A for support. Cues for technique.   Wheelchair Mobility    Modified Rankin (Stroke Patients Only)       Balance Overall balance assessment: Needs assistance Sitting-balance support: No upper extremity supported;Feet supported Sitting balance-Leahy Scale: Good     Standing balance support: During functional activity Standing balance-Leahy Scale: Fair Standing balance comment: Able to stand wihtout UE support.                            Cognition Arousal/Alertness: Awake/alert Behavior During Therapy: WFL for tasks assessed/performed Overall Cognitive Status: Within Functional Limits for tasks assessed                                        Exercises Total Joint Exercises Hip ABduction/ADduction: AROM;Both;5 reps;Standing Knee Flexion: AROM;Both;5 reps;Standing Marching in Standing: AROM;Both;5 reps;Standing Standing Hip Extension: AROM;Both;5 reps;Standing    General Comments        Pertinent Vitals/Pain Pain Assessment: Faces Faces Pain Scale: Hurts little more Pain Location: Lft hip Pain Descriptors / Indicators: Aching;Operative site guarding Pain Intervention(s): Monitored during session;Premedicated before session    Home Living                      Prior Function            PT Goals (current goals can now be found in the care plan section) Progress towards PT goals: Progressing toward goals    Frequency  7X/week      PT Plan Current plan remains appropriate    Co-evaluation              AM-PAC PT "6 Clicks" Mobility   Outcome Measure  Help needed turning from your back to your side while in a flat bed without using bedrails?: None Help needed moving from lying on your back to sitting on the side of a flat bed without using bedrails?: None Help needed moving to and from a bed to a chair (including  a wheelchair)?: None Help needed standing up from a chair using your arms (e.g., wheelchair or bedside chair)?: A Little Help needed to walk in hospital room?: A Little Help needed climbing 3-5 steps with a railing? : A Little 6 Click Score: 21    End of Session Equipment Utilized During Treatment: Gait belt Activity Tolerance: Patient tolerated treatment well Patient left: in chair;with call bell/phone within reach;with nursing/sitter in room Nurse Communication: Mobility status PT Visit Diagnosis: Other abnormalities of gait and mobility (R26.89);Pain Pain - Right/Left: Left Pain - part of body: Hip     Time: 0347-4259 PT Time Calculation (min) (ACUTE ONLY): 28 min  Charges:  $Gait Training: 8-22 mins $Therapeutic Exercise: 8-22 mins                     Marisa Severin, PT, DPT Acute Rehabilitation Services Pager [phone] Office [place] 08/22/2020, 12:31 PM

## 2020-08-22 NOTE — Progress Notes (Signed)
Patient is discharged from room 3C11 at this time. Alert and in stable condition. IV site d/c'd and instructions read to patient and spouse with all questions answered and understanding verbalized. Left unit via wheelchair with all belongings at side.

## 2020-08-22 NOTE — Discharge Summary (Signed)
Patient ID: [name] MRN: [id number] DOB/AGE: 12-05-1965 55 y.o.  Admit date: 08/21/2020 Discharge date: 08/22/2020  Admission Diagnoses:  Principal Problem:   Primary osteoarthritis of left hip Active Problems:   Status post total replacement of left hip   Discharge Diagnoses:  Same  Past Medical History:  Diagnosis Date   Generalized headaches    GERD (gastroesophageal reflux disease)    Hypothyroidism    Thyroid disease    hypo    Surgeries: Procedure(s): LEFT TOTAL HIP ARTHROPLASTY ANTERIOR APPROACH on 08/21/2020   Consultants:   Discharged Condition: Improved  Hospital Course: Cathey Fredenburg is an 55 y.o. female who was admitted 08/21/2020 for operative treatment ofPrimary osteoarthritis of left hip. Patient has severe unremitting pain that affects sleep, daily activities, and work/hobbies. After pre-op clearance the patient was taken to the operating room on 08/21/2020 and underwent  Procedure(s): LEFT TOTAL HIP ARTHROPLASTY ANTERIOR APPROACH.    Patient was given perioperative antibiotics:  Anti-infectives (From admission, onward)    Start     Dose/Rate Route Frequency Ordered Stop   08/21/20 1600  ceFAZolin (ANCEF) IVPB 2g/100 mL premix        2 g 200 mL/hr over 30 Minutes Intravenous Every 6 hours 08/21/20 1343 08/22/20 0754   08/21/20 1131  vancomycin (VANCOCIN) powder  Status:  Discontinued          As needed 08/21/20 1131 08/21/20 1337   08/21/20 0830  ceFAZolin (ANCEF) IVPB 2g/100 mL premix        2 g 200 mL/hr over 30 Minutes Intravenous On call to O.R. 08/21/20 0818 08/21/20 1026   08/21/20 0823  ceFAZolin (ANCEF) 2-4 GM/100ML-% IVPB       Note to Pharmacy: Gustavo Lah   : cabinet override      08/21/20 0823 08/21/20 1029        Patient was given sequential compression devices, early ambulation, and chemoprophylaxis to prevent DVT.  Patient benefited maximally from hospital stay and there were no complications.    Recent vital signs: Patient Vitals  for the past 24 hrs:  BP Temp Temp src Pulse Resp SpO2 Height Weight  08/22/20 0708 123/63 99.7 F (37.6 C) Oral 94 18 94 % -- --  08/22/20 0445 102/61 99.3 F (37.4 C) Oral 90 18 98 % -- --  08/21/20 2305 114/65 99.6 F (37.6 C) Oral 99 18 98 % -- --  08/21/20 1932 130/77 99.2 F (37.3 C) Oral 99 18 98 % -- --  08/21/20 1353 (!) 141/79 97.8 F (36.6 C) Oral 69 20 97 % -- --  08/21/20 1330 117/75 (!) 97.4 F (36.3 C) -- 63 12 96 % -- --  08/21/20 1325 105/70 -- -- 63 12 95 % -- --  08/21/20 1310 124/77 (!) 97.4 F (36.3 C) -- 60 10 97 % -- --  08/21/20 1255 130/78 -- -- 69 12 98 % -- --  08/21/20 1240 119/81 97.7 F (36.5 C) -- 73 (!) 9 100 % -- --  08/21/20 0822 (!) 153/90 99.1 F (37.3 C) Oral 87 18 96 % 5\' 3"  (1.6 m) 79.4 kg     Recent laboratory studies:  Recent Labs    08/22/20 0624  WBC 11.0*  HGB 11.4*  HCT 32.9*  PLT 139*  NA 134*  K 3.4*  CL 101  CO2 27  BUN 12  CREATININE 0.81  GLUCOSE 128*  CALCIUM 8.7*     Discharge Medications:   Allergies as of 08/22/2020  Reactions   Succinylcholine Rash        Medication List     STOP taking these medications    acetaminophen 500 MG tablet Commonly known as: TYLENOL   ibuprofen 200 MG tablet Commonly known as: ADVIL   meloxicam 15 MG tablet Commonly known as: MOBIC       TAKE these medications    aspirin EC 81 MG tablet Take 1 tablet (81 mg total) by mouth 2 (two) times daily. To be taken after surgery   Coenzyme Q10 200 MG capsule Take 200 mg by mouth daily.   docusate sodium 100 MG capsule Commonly known as: Colace Take 1 capsule (100 mg total) by mouth daily as needed.   methocarbamol 500 MG tablet Commonly known as: Robaxin Take 1 tablet (500 mg total) by mouth 2 (two) times daily as needed. To be taken after surgery   MULTIVITAMIN PO Take 1 tablet by mouth daily.   omeprazole 20 MG capsule Commonly known as: PRILOSEC Take 20 mg by mouth daily as needed (acid reflux).    ondansetron 4 MG tablet Commonly known as: Zofran Take 1 tablet (4 mg total) by mouth every 8 (eight) hours as needed for nausea or vomiting.   oxyCODONE-acetaminophen 5-325 MG tablet Commonly known as: Percocet Take 1-2 tablets by mouth every 6 (six) hours as needed. To be taken after surgery   Synthroid 75 MCG tablet Generic drug: levothyroxine Take 1 tablet (75 mcg total) by mouth daily.   Vitamin D3 75 MCG (3000 UT) Tabs Take 3,000 Units by mouth daily.       ASK your doctor about these medications    atorvastatin 10 MG tablet Commonly known as: LIPITOR TAKE 1 TABLET BY MOUTH EVERY DAY   nitrofurantoin (macrocrystal-monohydrate) 100 MG capsule Commonly known as: MACROBID Take 100 mg by mouth 2 (two) times daily.               Durable Medical Equipment  (From admission, onward)           Start     Ordered   08/21/20 1344  DME Walker rolling  Once       Question:  Patient needs a walker to treat with the following condition  Answer:  History of hip replacement   08/21/20 1343   08/21/20 1344  DME 3 n 1  Once        08/21/20 1343   08/21/20 1344  DME Bedside commode  Once       Question:  Patient needs a bedside commode to treat with the following condition  Answer:  History of hip replacement   08/21/20 1343            Diagnostic Studies: DG Chest 2 View  Result Date: 08/16/2020 CLINICAL DATA:  Preop clearance prior to hip surgery EXAM: CHEST - 2 VIEW COMPARISON:  09/05/2010 FINDINGS: The heart size and mediastinal contours are within normal limits. Both lungs are clear. The visualized skeletal structures are unremarkable. IMPRESSION: No active cardiopulmonary disease. Electronically Signed   By: Miachel Roux M.D.   On: 08/16/2020 13:41   DG Pelvis Portable  Result Date: 08/21/2020 CLINICAL DATA:  Status post total hip replacement EXAM: PORTABLE PELVIS 1-2 VIEWS COMPARISON:  Intraoperative left hip radiographs August 21, 2020 FINDINGS: Frontal pelvis  obtained. There is a total hip replacement on the left with prosthetic components well-seated on frontal view. No fracture or dislocation. There is mild narrowing of the right hip joint. Soft tissue  air noted on the left. IMPRESSION: Total hip replacement on the left with prosthetic components well-seated on frontal view. No fracture or dislocation. Mild narrowing right hip joint. Acute postoperative changes noted on the left. Electronically Signed   By: Lowella Grip III M.D.   On: 08/21/2020 13:04   DG C-Arm 1-60 Min  Result Date: 08/21/2020 CLINICAL DATA:  Surgery, elective. Additional history provided: Left-sided hip arthroplasty with anterior approach. Provided fluoroscopy time 0 minutes, 41 seconds (7.6 mGy). EXAM: OPERATIVE left HIP (WITH PELVIS IF PERFORMED) 3 VIEWS TECHNIQUE: Fluoroscopic spot image(s) were submitted for interpretation post-operatively. COMPARISON:  Radiographs of the left hip 03/07/2020. FINDINGS: Three intraoperative fluoroscopic images of the left hip are submitted. On the provided images, there are findings of interval left total hip arthroplasty. The femoral and acetabular components appear well seated. No unexpected finding on the provided views. IMPRESSION: Three intraoperative fluoroscopic images of the left hip from left total hip arthroplasty, as described. Electronically Signed   By: Kellie Simmering DO   On: 08/21/2020 11:48   DG HIP PORT UNILAT WITH PELVIS 1V LEFT  Result Date: 08/21/2020 CLINICAL DATA:  Gas haze stuck to [facility] radiology ligament that towel left hip on Laural Benes EXAM: DG HIP (WITH OR WITHOUT PELVIS) 1V PORT LEFT COMPARISON:  Intraoperative spot fluoro films from earlier the same day. FINDINGS: Status post left total hip replacement. No evidence for unexpected soft tissue foreign body. IMPRESSION: No evidence for unexpected radiopaque soft tissue foreign body. Results called directly to the OR circulator, Carly, at 12:26 p.m. on 08/21/2020.  Electronically Signed   By: Misty Stanley M.D.   On: 08/21/2020 12:26   DG HIP OPERATIVE UNILAT WITH PELVIS LEFT  Result Date: 08/21/2020 CLINICAL DATA:  Surgery, elective. Additional history provided: Left-sided hip arthroplasty with anterior approach. Provided fluoroscopy time 0 minutes, 41 seconds (7.6 mGy). EXAM: OPERATIVE left HIP (WITH PELVIS IF PERFORMED) 3 VIEWS TECHNIQUE: Fluoroscopic spot image(s) were submitted for interpretation post-operatively. COMPARISON:  Radiographs of the left hip 03/07/2020. FINDINGS: Three intraoperative fluoroscopic images of the left hip are submitted. On the provided images, there are findings of interval left total hip arthroplasty. The femoral and acetabular components appear well seated. No unexpected finding on the provided views. IMPRESSION: Three intraoperative fluoroscopic images of the left hip from left total hip arthroplasty, as described. Electronically Signed   By: Kellie Simmering DO   On: 08/21/2020 11:48    Disposition: Discharge disposition: 01-Home or Self Care          Follow-up Information     Leandrew Koyanagi, MD. Schedule an appointment as soon as possible for a visit in 2 week(s).   Specialty: Orthopedic Surgery Contact information: [address] [place] [address] [phone]                  Signed: Aundra Dubin 08/22/2020, 8:20 AM

## 2020-08-22 NOTE — TOC Transition Note (Signed)
Transition of Care [facility]) - CM/SW Discharge Note   Patient Details  Name: [name] MRN: [id number] Date of Birth: [birth year]  [facility] CM/SW Contact:  Angelita Ingles, RN Phone [phone]  08/22/2020, 3:35 PM   Clinical Narrative:    CM at bedside to offer choice for [facility]. HH has been set up with [facility]. No other needs noted at this time. TOC will sign off.   Final next level of care: [place] Barriers to Discharge: No Barriers Identified   Patient Goals and CMS Choice Patient states their goals for this hospitalization and ongoing recovery are:: Ready to go home CMS Medicare.gov Compare Post Acute Care list provided to:: Patient Choice offered to / list presented to : Patient  Discharge Placement                       Discharge Plan and Services                DME Arranged: N/A         HH Arranged: PT HH Agency: [place] ([facility]) Date [place]: 08/22/20 Time [place]: [place] Representative spoke with at [place]: [place] ([place]) Interventions     Readmission Risk Interventions No flowsheet data found.

## 2020-08-23 ENCOUNTER — Telehealth: Payer: Self-pay

## 2020-08-23 NOTE — Telephone Encounter (Signed)
Called to approve orders 

## 2020-08-23 NOTE — Telephone Encounter (Signed)
[facility] she is requesting verbal orders for Physical therapy with a frequency of 2w2w and 1w1w call back:[phone]

## 2020-09-02 ENCOUNTER — Encounter: Payer: Self-pay | Admitting: Orthopaedic Surgery

## 2020-09-02 ENCOUNTER — Other Ambulatory Visit: Payer: Self-pay | Admitting: Physician Assistant

## 2020-09-03 ENCOUNTER — Other Ambulatory Visit: Payer: Self-pay | Admitting: Physician Assistant

## 2020-09-03 ENCOUNTER — Encounter: Payer: Self-pay | Admitting: Orthopaedic Surgery

## 2020-09-03 DIAGNOSIS — E785 Hyperlipidemia, unspecified: Secondary | ICD-10-CM

## 2020-09-04 ENCOUNTER — Other Ambulatory Visit: Payer: Self-pay | Admitting: Physician Assistant

## 2020-09-04 MED ORDER — METHOCARBAMOL 500 MG PO TABS: 500.0000 mg | ORAL_TABLET | Freq: Two times a day (BID) | ORAL | 0 refills | Status: DC | PRN

## 2020-09-04 MED ORDER — OXYCODONE-ACETAMINOPHEN 5-325 MG PO TABS: 1.0000 | ORAL_TABLET | Freq: Every day | ORAL | 0 refills | Status: DC | PRN

## 2020-09-04 NOTE — Telephone Encounter (Signed)
Sent in

## 2020-09-04 NOTE — Telephone Encounter (Signed)
Ok to take tylenol, just do not take more than 4000mg  per day.  Does she need another oxy rx for night?

## 2020-09-05 ENCOUNTER — Ambulatory Visit (INDEPENDENT_AMBULATORY_CARE_PROVIDER_SITE_OTHER): Payer: No Typology Code available for payment source | Admitting: Physician Assistant

## 2020-09-05 ENCOUNTER — Ambulatory Visit (INDEPENDENT_AMBULATORY_CARE_PROVIDER_SITE_OTHER): Payer: No Typology Code available for payment source | Admitting: Physical Therapy

## 2020-09-05 ENCOUNTER — Encounter: Payer: Self-pay | Admitting: Physical Therapy

## 2020-09-05 ENCOUNTER — Other Ambulatory Visit: Payer: Self-pay

## 2020-09-05 ENCOUNTER — Encounter: Payer: Self-pay | Admitting: Physician Assistant

## 2020-09-05 DIAGNOSIS — R262 Difficulty in walking, not elsewhere classified: Secondary | ICD-10-CM

## 2020-09-05 DIAGNOSIS — R600 Localized edema: Secondary | ICD-10-CM

## 2020-09-05 DIAGNOSIS — M6281 Muscle weakness (generalized): Secondary | ICD-10-CM

## 2020-09-05 DIAGNOSIS — Z96642 Presence of left artificial hip joint: Secondary | ICD-10-CM

## 2020-09-05 DIAGNOSIS — M25552 Pain in left hip: Secondary | ICD-10-CM | POA: Diagnosis not present

## 2020-09-05 NOTE — Therapy (Signed)
[facility] [address] [place], [place], [address] Phone: [phone]   Fax:  [phone]  Physical Therapy Evaluation  Patient Details  Name: [name] MRN: [id number] Date of Birth: [birth year] Referring Provider (PT): Frankey Shown MD   Encounter Date: 09/05/2020   PT End of Session - 09/05/20 1356     Visit Number 1    Number of Visits 16    Date for PT Re-Evaluation 10/27/20    Progress Note Due on Visit 10    PT Start Time 8182    PT Stop Time 1428    PT Time Calculation (min) 43 min    Equipment Utilized During Treatment Gait belt    Activity Tolerance Patient tolerated treatment well    Behavior During Therapy [facility] for tasks assessed/performed             Past Medical History:  Diagnosis Date   Generalized headaches    GERD (gastroesophageal reflux disease)    Hypothyroidism    Thyroid disease    hypo    Past Surgical History:  Procedure Laterality Date   ABDOMINAL HYSTERECTOMY N/A    Phreesia 08/30/2019   APPENDECTOMY  2003   CESAREAN SECTION  2005   CESAREAN SECTION N/A    Phreesia 08/30/2019   CHOLECYSTECTOMY     DIAGNOSTIC LAPAROSCOPY     Lap chole.   ROBOTIC ASSISTED TOTAL HYSTERECTOMY Bilateral 09/29/2012   Procedure: ROBOTIC ASSISTED TOTAL HYSTERECTOMY WITH BILATERAL SALPINGECTOMY;  Surgeon: Marvene Staff, MD;  Location: [facility];  Service: Gynecology;  Laterality: Bilateral;  3 hrs.   TONSILLECTOMY     TONSILLECTOMY  1969   TOTAL HIP ARTHROPLASTY Left 08/21/2020   Procedure: LEFT TOTAL HIP ARTHROPLASTY ANTERIOR APPROACH;  Surgeon: Leandrew Koyanagi, MD;  Location: [facility];  Service: Orthopedics;  Laterality: Left;  3-C    There were no vitals filed for this visit.    Subjective Assessment - 09/05/20 1346     Subjective Pt arriving to therapy today s/p lfet THR on 08/21/2020. Pt stating she had 4 HHPT visits. Pt reproting 4/10 pain in left hip. Pt stating she took a percocet at 8:30 this morning.     Pertinent History s/p THR anterior approach Frankey Shown MD    Patient Stated Goals Pt wants to go back to work on Monday, pt wants to be able to drive by July 25 for daughter's camp.    Currently in Pain? Yes    Pain Score 4     Pain Location Hip    Pain Orientation Left    Pain Descriptors / Indicators Aching;Sore    Pain Type Surgical pain    Pain Onset 1 to 4 weeks ago    Pain Frequency Intermittent    Aggravating Factors  increased activity    Pain Relieving Factors pain meds    Effect of Pain on Daily Activities difficulty sleeping                [facility] PT Assessment - 09/05/20 0001       Assessment   Medical Diagnosis m16.12 OA left hip    Referring Provider (PT) Frankey Shown MD    Onset Date/Surgical Date 08/21/20    Hand Dominance Right    Prior Therapy yes HHPT 4 visits      Precautions   Precautions Anterior Hip    Precaution Comments reviewed precautions      Restrictions   Weight Bearing Restrictions No  Balance Screen   Has the patient fallen in the past 6 months No    Is the patient reluctant to leave their home because of a fear of falling?  No      Home Environment   Living Environment Private residence    Living Arrangements Spouse/significant other    Type of [place] to enter    Entrance Stairs-Number of Steps 4    Entrance Stairs-Rails None    Additional Comments pt has a laundry in her basement      Prior Function   Level of Independence Independent    Vocation Full time employment    Vocation Requirements works form home    Leisure hike, read and garden      Cognition   Overall Cognitive Status Within Functional Limits for tasks assessed      Observation/Other Assessments   Focus on Therapeutic Outcomes (FOTO)  47 (predicted 67)      ROM / Strength   AROM / PROM / Strength Strength;AROM      AROM   AROM Assessment Site Hip    Right/Left Hip Right;Left    Right Hip Flexion 110    Right Hip External  Rotation  35    Right Hip Internal Rotation  25    Right Hip ABduction 50    Left Hip Flexion 90    Left Hip External Rotation  30    Left Hip Internal Rotation  20    Left Hip ABduction 35      Strength   Strength Assessment Site Hip    Right/Left Hip Right;Left    Right Hip Flexion 5/5    Right Hip Extension 5/5    Right Hip ABduction 5/5    Right Hip ADduction 5/5    Left Hip Flexion 4/5    Left Hip ABduction 4/5    Left Hip ADduction 4/5      Palpation   Palpation comment TTP along incision site      Transfers   Five time sit to stand comments  13 seconds with UE support      Ambulation/Gait   Assistive device Rolling walker    Gait Pattern Step-through pattern    Gait Comments mild antalgic gait pattern                        Objective measurements completed on examination: See above findings.       [facility] Adult PT Treatment/Exercise - 09/05/20 0001       Exercises   Exercises Knee/Hip      Knee/Hip Exercises: Stretches   Active Hamstring Stretch Left;2 reps;30 seconds      Knee/Hip Exercises: Seated   Sit to Sand 5 reps;with UE support      Knee/Hip Exercises: Supine   Bridges Strengthening;10 reps;Limitations    Bridges Limitations holding 5 seconds    Other Supine Knee/Hip Exercises ball squeezes x 10 holding 5 seconds, clams x 10 holding 5 seconds using green theraband                    PT Education - 09/05/20 1355     Education Details PT POC, HEP    Person(s) Educated Patient    Methods Explanation;Demonstration;Handout;Tactile cues;Verbal cues    Comprehension Verbalized understanding;Returned demonstration;Tactile cues required              PT Short Term Goals - 09/05/20 1359  PT SHORT TERM GOAL #1   Title Pt will be indepenent in her initial HEP    Time 3    Period Weeks    Status New    Target Date 09/29/20      PT SHORT TERM GOAL #2   Title Pt will be able to perform 5 time sit to stand with  no UE support in </= 14 seconds    Time 3    Status New    Target Date 09/29/20               PT Long Term Goals - 09/05/20 1558       PT LONG TERM GOAL #1   Title Pt will be independent in advanced HEP.    Time 8    Period Weeks    Status New    Target Date 11/03/20      PT LONG TERM GOAL #2   Title Pt will be able to perform functional squat to pick up 15 pounds from floor to counter height with pain </= 2/10.    Time 8    Period Weeks    Status New      PT LONG TERM GOAL #3   Title Pt will be able to amb community surfaces with no device with pain </= 2/10 for at least 20 minutes.    Time 8    Period Weeks    Status New    Target Date 11/03/20      PT LONG TERM GOAL #4   Title Pt will improve FOTO score from 47% to 67%.    Time 8    Period Weeks    Status New    Target Date 11/03/20                    Plan - 09/05/20 1428     Clinical Impression Statement Pt arriving for PT evaluation s/p left THR on 08/21/2020. Pt presenting with mild weakness in left hip of 4/5 compared to right. Pt also with limitations in ROM noted. Pt amb today into clinic with rolling walker. Pt's gait was assessed and pt was advised to discontinue sing her rolling walker on level surfaces and inside her home. Further assessment will need to be made before pt is able to amb with a walking stick up and down her gravel driveway. Skilled PT needed to address pt's impairments with the below interventions.    Examination-Activity Limitations Lift;Transfers;Other;Squat;Stairs;Stand    Examination-Participation Restrictions Other;Yard Work;Occupation;Cleaning    Stability/Clinical Decision Making Stable/Uncomplicated    Clinical Decision Making Low    Rehab Potential Excellent    PT Frequency 2x / week    PT Duration 8 weeks    PT Treatment/Interventions ADLs/Self Care Home Management;Cryotherapy;Electrical Stimulation;Ultrasound;Gait training;Stair training;Moist Heat;Functional  mobility training;Therapeutic activities;Therapeutic exercise;Balance training;Neuromuscular re-education;Patient/family education;Manual techniques;Passive range of motion;Taping    PT Next Visit Plan evaluate for walking stick for uneven surfaces, discontinuing the walker if appropriate, Nustep vs bike, hip strengtheing, gait training.    PT Home Exercise Plan Access Code: [email or web address]  URL: [email or web address]  Date: 09/05/2020  Prepared by: Kearney Hard    Exercises  Hooklying Hamstring Stretch with Strap - 2-3 x daily - 7 x weekly - 3 sets - 3 reps - 30 seconds hold  Supine Hip Adduction Isometric with Ball - 2-3 x daily - 7 x weekly - 2 sets - 10 reps - 5 seconds hold  Standing March with Counter Support - 2-3  x daily - 7 x weekly - 2 sets - 10 reps  Heel Toe Raises with Unilateral Counter Support - 2-3 x daily - 7 x weekly - 2 sets - 10 reps  Sit to Stand - 2-3 x daily - 7 x weekly - 2 sets - 10 reps  Supine Bridge - 2-3 x daily - 7 x weekly - 2 sets - 10 reps - 5 seconds hold  Hooklying Clamshell with Resistance - 2-3 x daily - 7 x weekly - 2 sets - 10 reps - 5 seconds hold    Consulted and Agree with Plan of Care Patient             Patient will benefit from skilled therapeutic intervention in order to improve the following deficits and impairments:  Pain, Decreased mobility, Decreased strength, Increased edema, Decreased balance, Impaired flexibility, Decreased activity tolerance, Difficulty walking  Visit Diagnosis: Pain in left hip  Muscle weakness (generalized)  Difficulty in walking, not elsewhere classified  Localized edema     Problem List Patient Active Problem List   Diagnosis Date Noted   Status post total replacement of left hip 08/21/2020   Vitamin D deficiency 07/13/2020   Pure hypercholesterolemia 07/13/2020   Gastroesophageal reflux disease 07/13/2020   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   Allergic rhinitis due to pollen 07/13/2020   Pain  of right heel 07/07/2020   Primary osteoarthritis of left hip 04/25/2020   Hypothyroidism 09/30/2019   Heartburn 09/30/2019   Left hip pain 08/06/2017   Cholelithiases 10/11/2010    Oretha Caprice, PT, MPT 09/05/2020, 4:06 PM  [facility] Physical Therapy [address] [place], [place], [address] Phone: [phone]   Fax:  [phone]  Name: [name] MRN: [id number] Date of Birth: [birth year]

## 2020-09-05 NOTE — Patient Instructions (Signed)
Access Code: [email or web address] URL: [email or web address] Date: 09/05/2020 Prepared by: Kearney Hard  Exercises Hooklying Hamstring Stretch with Strap - 2-3 x daily - 7 x weekly - 3 sets - 3 reps - 30 seconds hold Supine Hip Adduction Isometric with Ball - 2-3 x daily - 7 x weekly - 2 sets - 10 reps - 5 seconds hold Standing March with Counter Support - 2-3 x daily - 7 x weekly - 2 sets - 10 reps Heel Toe Raises with Unilateral Counter Support - 2-3 x daily - 7 x weekly - 2 sets - 10 reps Sit to Stand - 2-3 x daily - 7 x weekly - 2 sets - 10 reps Supine Bridge - 2-3 x daily - 7 x weekly - 2 sets - 10 reps - 5 seconds hold Hooklying Clamshell with Resistance - 2-3 x daily - 7 x weekly - 2 sets - 10 reps - 5 seconds hold

## 2020-09-05 NOTE — Progress Notes (Signed)
Post-Op Visit Note   [name]           Date of Birth: 1965/08/20           MRN: [id number] Visit Date: 09/05/2020 PCP: Lorrene Reid, PA-C   Assessment & Plan:  Chief Complaint:  Chief Complaint  Patient presents with   Left Hip - Pain    Visit Diagnoses:  1. Status post total hip replacement, left     Plan: Patient is a pleasant 55 year old female who comes in today 2 weeks out left total hip replacement date of surgery 08/21/2020.  She has been doing well.  She has been getting home health physical therapy and is currently ambulating with a walker.  She is scheduled for her first outpatient physical therapy visit today.  She has been taking primarily Tylenol during the day and oxycodone at night.  Examination of her left hip reveals a well-healed surgical incision without evidence of infection or cellulitis.  Calves are soft and nontender.  She is neurovascular intact distally.  Today, Steri-Strips were applied to the incision.  Dental prophylaxis reinforced.  She will continue working with physical therapy and a home exercise program.  She will follow-up with [place] in 4 weeks time for repeat evaluation and AP pelvis x-rays.  Call with concerns or questions in the meantime.  Follow-Up Instructions: Return in about 4 weeks (around 10/03/2020).   Orders:  No orders of the defined types were placed in this encounter.  No orders of the defined types were placed in this encounter.   Imaging: No new imaging  PMFS History: Patient Active Problem List   Diagnosis Date Noted   Status post total replacement of left hip 08/21/2020   Vitamin D deficiency 07/13/2020   Pure hypercholesterolemia 07/13/2020   Gastroesophageal reflux disease 07/13/2020   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   Allergic rhinitis due to pollen 07/13/2020   Pain of right heel 07/07/2020   Primary osteoarthritis of left hip 04/25/2020   Hypothyroidism 09/30/2019   Heartburn 09/30/2019    Left hip pain 08/06/2017   Cholelithiases 10/11/2010   Past Medical History:  Diagnosis Date   Generalized headaches    GERD (gastroesophageal reflux disease)    Hypothyroidism    Thyroid disease    hypo    Family History  Problem Relation Age of Onset   Stroke Father    Transient ischemic attack Father    Heart disease Mother    COPD Mother    Cancer Mother    Breast cancer Mother    Heart attack Mother    Diabetes Mother    Breast cancer Maternal Aunt    Breast cancer Maternal Grandmother    Diabetes Paternal Grandmother     Past Surgical History:  Procedure Laterality Date   ABDOMINAL HYSTERECTOMY N/A    Phreesia 08/30/2019   APPENDECTOMY  2003   CESAREAN SECTION  2005   CESAREAN SECTION N/A    Phreesia 08/30/2019   CHOLECYSTECTOMY     DIAGNOSTIC LAPAROSCOPY     Lap chole.   ROBOTIC ASSISTED TOTAL HYSTERECTOMY Bilateral 09/29/2012   Procedure: ROBOTIC ASSISTED TOTAL HYSTERECTOMY WITH BILATERAL SALPINGECTOMY;  Surgeon: Marvene Staff, MD;  Location: [facility];  Service: Gynecology;  Laterality: Bilateral;  3 hrs.   TONSILLECTOMY     TONSILLECTOMY  1969   TOTAL HIP ARTHROPLASTY Left 08/21/2020   Procedure: LEFT TOTAL HIP ARTHROPLASTY ANTERIOR APPROACH;  Surgeon: Leandrew Koyanagi, MD;  Location: [facility];  Service:  Orthopedics;  Laterality: Left;  3-C   Social History   Occupational History   Occupation: document Games developer: [employer]  Tobacco Use   Smoking status: Former    Packs/day: 0.25    Pack years: 0.00    Types: Cigarettes    Quit date: 03/11/2013    Years since quitting: 7.4   Smokeless tobacco: Never  Vaping Use   Vaping Use: Never used  Substance and Sexual Activity   Alcohol use: Yes    Comment: rare   Drug use: No   Sexual activity: Yes    Birth control/protection: None

## 2020-09-06 ENCOUNTER — Ambulatory Visit (INDEPENDENT_AMBULATORY_CARE_PROVIDER_SITE_OTHER): Payer: No Typology Code available for payment source | Admitting: Rehabilitative and Restorative Service Providers"

## 2020-09-06 ENCOUNTER — Encounter: Payer: Self-pay | Admitting: Rehabilitative and Restorative Service Providers"

## 2020-09-06 DIAGNOSIS — M6281 Muscle weakness (generalized): Secondary | ICD-10-CM

## 2020-09-06 DIAGNOSIS — R600 Localized edema: Secondary | ICD-10-CM

## 2020-09-06 DIAGNOSIS — M25552 Pain in left hip: Secondary | ICD-10-CM

## 2020-09-06 DIAGNOSIS — R262 Difficulty in walking, not elsewhere classified: Secondary | ICD-10-CM | POA: Diagnosis not present

## 2020-09-06 NOTE — Patient Instructions (Signed)
Access Code: [email or web address] URL: [email or web address] Date: 09/06/2020 Prepared by: Vista Mink  Exercises Hooklying Hamstring Stretch with Strap - 2-3 x daily - 7 x weekly - 3 sets - 3 reps - 30 seconds hold Supine Hip Adduction Isometric with Ball - 2-3 x daily - 7 x weekly - 2 sets - 10 reps - 5 seconds hold Standing March with Counter Support - 2-3 x daily - 7 x weekly - 2 sets - 10 reps Heel Toe Raises with Unilateral Counter Support - 2-3 x daily - 7 x weekly - 2 sets - 10 reps Sit to Stand - 2-3 x daily - 7 x weekly - 2 sets - 10 reps Supine Bridge - 2-3 x daily - 7 x weekly - 2 sets - 10 reps - 5 seconds hold Hooklying Clamshell with Resistance - 2-3 x daily - 7 x weekly - 2 sets - 10 reps - 5 seconds hold Tandem Stance - 1-2 x daily - 7 x weekly - 1 sets - 5 reps - 20 second hold Standing Hip Hiking - 2 x daily - 7 x weekly - 2 sets - 10 reps - 3 seconds hold Standing Lumbar Extension at [place] - 5 x daily - 7 x weekly - 1 sets - 5 reps - 3 seconds hold

## 2020-09-06 NOTE — Therapy (Signed)
[facility] [place], [place], [address] Phone: [phone]   Fax:  [phone]  Physical Therapy Treatment  Patient Details  Name: [name] MRN: [id number] Date of Birth: May 06, 1965 Referring Provider (PT): Frankey Shown MD   Encounter Date: 09/06/2020   PT End of Session - 09/06/20 1522     Visit Number 2    Number of Visits 16    Date for PT Re-Evaluation 10/27/20    Progress Note Due on Visit 10    PT Start Time 1430    PT Stop Time 1510    PT Time Calculation (min) 40 min    Activity Tolerance Patient tolerated treatment well;No increased pain    Behavior During Therapy WFL for tasks assessed/performed             Past Medical History:  Diagnosis Date   Generalized headaches    GERD (gastroesophageal reflux disease)    Hypothyroidism    Thyroid disease    hypo    Past Surgical History:  Procedure Laterality Date   ABDOMINAL HYSTERECTOMY N/A    Phreesia 08/30/2019   APPENDECTOMY  2003   CESAREAN SECTION  2005   CESAREAN SECTION N/A    Phreesia 08/30/2019   CHOLECYSTECTOMY     DIAGNOSTIC LAPAROSCOPY     Lap chole.   ROBOTIC ASSISTED TOTAL HYSTERECTOMY Bilateral 09/29/2012   Procedure: ROBOTIC ASSISTED TOTAL HYSTERECTOMY WITH BILATERAL SALPINGECTOMY;  Surgeon: Marvene Staff, MD;  Location: [facility];  Service: Gynecology;  Laterality: Bilateral;  3 hrs.   TONSILLECTOMY     TONSILLECTOMY  1969   TOTAL HIP ARTHROPLASTY Left 08/21/2020   Procedure: LEFT TOTAL HIP ARTHROPLASTY ANTERIOR APPROACH;  Surgeon: Leandrew Koyanagi, MD;  Location: [facility];  Service: Orthopedics;  Laterality: Left;  3-C    There were no vitals filed for this visit.   Subjective Assessment - 09/06/20 1518     Subjective Dene reports good early HEP compliance.  She has been using the walker outside the house and sometimes no assistive device in the house.    Pertinent History s/p THR anterior approach Frankey Shown MD    Patient Stated  Goals Pt wants to go back to work on Monday, pt wants to be able to drive by July 25 for daughter's camp.    Currently in Pain? Yes    Pain Score 5     Pain Location Hip    Pain Orientation Left    Pain Descriptors / Indicators Aching;Sore;Tightness    Pain Type Surgical pain    Pain Radiating Towards NA    Pain Onset 1 to 4 weeks ago    Pain Frequency Intermittent    Aggravating Factors  Prolonged postures and too much WB    Pain Relieving Factors Pain medications and change of position    Effect of Pain on Daily Activities Difficulty sleeping    Multiple Pain Sites No                               [facility] Adult PT Treatment/Exercise - 09/06/20 0001       Neuro Re-ed    Neuro Re-ed Details  Tandem balance eyes open/turning head/eyes closed 3X each 20 seconds      Exercises   Exercises Knee/Hip      Knee/Hip Exercises: Stretches   Active Hamstring Stretch Both;4 reps;20 seconds    Active Hamstring Stretch Limitations Other leg  straight      Knee/Hip Exercises: Standing   Heel Raises Both;1 set;10 reps;3 seconds    Heel Raises Limitations Heel to toe raises    Hip Flexion AROM;Stengthening;Both;1 set;10 reps    Hip Flexion Limitations Standing marching    Other Standing Knee Exercises Hip hike in a doorway 2 sets of 10 for 3 seconds    Other Standing Knee Exercises Standing trunk extension AROM 10X 3 seconds      Knee/Hip Exercises: Seated   Sit to Sand 5 reps;without UE support;Other (comment)   slow eccentrics     Knee/Hip Exercises: Supine   Bridges Strengthening;Both;10 reps;Limitations    Bridges Limitations 5 seconds    Other Supine Knee/Hip Exercises Ball squeezes 10X 5 seconds and clamshells with green band 10X each 5 seconds                    PT Education - 09/06/20 1521     Education Details Reviewed HEP.  Added activities she can do during the day while at work focusing on hip flexors stretching, hip abductors strengthening and  balance.    Person(s) Educated Patient    Methods Explanation;Demonstration;Verbal cues;Handout    Comprehension Verbal cues required;Need further instruction;Returned demonstration;Verbalized understanding              PT Short Term Goals - 09/06/20 1522       PT SHORT TERM GOAL #1   Title Pt will be indepenent in her initial HEP    Time 3    Period Weeks    Status Achieved    Target Date 09/29/20      PT SHORT TERM GOAL #2   Title Pt will be able to perform 5 time sit to stand with no UE support in </= 14 seconds    Time 3    Status On-going    Target Date 09/29/20               PT Long Term Goals - 09/06/20 1522       PT LONG TERM GOAL #1   Title Pt will be independent in advanced HEP.    Time 8    Period Weeks    Status On-going      PT LONG TERM GOAL #2   Title Pt will be able to perform functional squat to pick up 15 pounds from floor to counter height with pain </= 2/10.    Time 8    Period Weeks    Status On-going      PT LONG TERM GOAL #3   Title Pt will be able to amb community surfaces with no device with pain </= 2/10 for at least 20 minutes.    Time 8    Period Weeks    Status On-going      PT LONG TERM GOAL #4   Title Pt will improve FOTO score from 47% to 67%.    Time 8    Period Weeks    Status On-going                   Plan - 09/06/20 1523     Clinical Impression Statement Dakayla did a great job demonstrating all her day 1 HEP activities.  Minimal correction was needed.  Asked her to straighten her opposite leg with hamstrings stretch to keep the hip flexors in a more lengthened position.  Added balance, hip abductors strengthening and a gentle standing hip flexors stretch to  her HEP.  I anticipate she will transition to a cane or maybe no AD next week.    Examination-Activity Limitations Lift;Transfers;Other;Squat;Stairs;Stand    Examination-Participation Restrictions Other;Yard Work;Occupation;Cleaning     Stability/Clinical Decision Making Stable/Uncomplicated    Rehab Potential Excellent    PT Frequency 2x / week    PT Duration 8 weeks    PT Treatment/Interventions ADLs/Self Care Home Management;Cryotherapy;Electrical Stimulation;Ultrasound;Gait training;Stair training;Moist Heat;Functional mobility training;Therapeutic activities;Therapeutic exercise;Balance training;Neuromuscular re-education;Patient/family education;Manual techniques;Passive range of motion;Taping    PT Next Visit Plan Hip strength, balance and less restrictive AD progressions    PT Home Exercise Plan Access Code: [email or web address]  URL: [email or web address]  Date: 09/05/2020  Prepared by: Kearney Hard    Exercises  Hooklying Hamstring Stretch with Strap - 2-3 x daily - 7 x weekly - 3 sets - 3 reps - 30 seconds hold  Supine Hip Adduction Isometric with Ball - 2-3 x daily - 7 x weekly - 2 sets - 10 reps - 5 seconds hold  Standing March with Counter Support - 2-3 x daily - 7 x weekly - 2 sets - 10 reps  Heel Toe Raises with Unilateral Counter Support - 2-3 x daily - 7 x weekly - 2 sets - 10 reps  Sit to Stand - 2-3 x daily - 7 x weekly - 2 sets - 10 reps  Supine Bridge - 2-3 x daily - 7 x weekly - 2 sets - 10 reps - 5 seconds hold  Hooklying Clamshell with Resistance - 2-3 x daily - 7 x weekly - 2 sets - 10 reps - 5 seconds hold    Consulted and Agree with Plan of Care Patient             Patient will benefit from skilled therapeutic intervention in order to improve the following deficits and impairments:  Pain, Decreased mobility, Decreased strength, Increased edema, Decreased balance, Impaired flexibility, Decreased activity tolerance, Difficulty walking  Visit Diagnosis: Difficulty in walking, not elsewhere classified  Muscle weakness (generalized)  Localized edema  Pain in left hip     Problem List Patient Active Problem List   Diagnosis Date Noted   Status post total replacement of left hip 08/21/2020    Vitamin D deficiency 07/13/2020   Pure hypercholesterolemia 07/13/2020   Gastroesophageal reflux disease 07/13/2020   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   Allergic rhinitis due to pollen 07/13/2020   Pain of right heel 07/07/2020   Primary osteoarthritis of left hip 04/25/2020   Hypothyroidism 09/30/2019   Heartburn 09/30/2019   Left hip pain 08/06/2017   Cholelithiases 10/11/2010    Farley Ly PT, MPT 09/06/2020, 3:26 PM  [facility] [address] [place], [place], [address] Phone: [phone]   Fax:  [phone]  Name: Jacie Tristan MRN: [id number] Date of Birth: Oct 25, 1965

## 2020-09-12 MED ORDER — TRAMADOL HCL 50 MG PO TABS: 50.0000 mg | ORAL_TABLET | Freq: Three times a day (TID) | ORAL | 0 refills | Status: DC | PRN

## 2020-09-13 ENCOUNTER — Ambulatory Visit (INDEPENDENT_AMBULATORY_CARE_PROVIDER_SITE_OTHER): Payer: No Typology Code available for payment source | Admitting: Rehabilitative and Restorative Service Providers"

## 2020-09-13 ENCOUNTER — Other Ambulatory Visit: Payer: Self-pay

## 2020-09-13 ENCOUNTER — Encounter: Payer: Self-pay | Admitting: Rehabilitative and Restorative Service Providers"

## 2020-09-13 DIAGNOSIS — M25552 Pain in left hip: Secondary | ICD-10-CM | POA: Diagnosis not present

## 2020-09-13 DIAGNOSIS — R600 Localized edema: Secondary | ICD-10-CM | POA: Diagnosis not present

## 2020-09-13 DIAGNOSIS — M6281 Muscle weakness (generalized): Secondary | ICD-10-CM

## 2020-09-13 DIAGNOSIS — R262 Difficulty in walking, not elsewhere classified: Secondary | ICD-10-CM | POA: Diagnosis not present

## 2020-09-13 NOTE — Therapy (Signed)
[facility] [place], [place], [address] Phone: [phone]   Fax:  [phone]  Physical Therapy Treatment  Patient Details  Name: [name] MRN: [id number] Date of Birth: 1966-03-06 Referring Provider (PT): Frankey Shown MD   Encounter Date: 09/13/2020   PT End of Session - 09/13/20 1715     Visit Number 3    Number of Visits 16    Date for PT Re-Evaluation 10/27/20    Progress Note Due on Visit 10    PT Start Time 3235    PT Stop Time 1515    PT Time Calculation (min) 43 min    Activity Tolerance Patient tolerated treatment well;No increased pain    Behavior During Therapy WFL for tasks assessed/performed             Past Medical History:  Diagnosis Date   Generalized headaches    GERD (gastroesophageal reflux disease)    Hypothyroidism    Thyroid disease    hypo    Past Surgical History:  Procedure Laterality Date   ABDOMINAL HYSTERECTOMY N/A    Phreesia 08/30/2019   APPENDECTOMY  2003   CESAREAN SECTION  2005   CESAREAN SECTION N/A    Phreesia 08/30/2019   CHOLECYSTECTOMY     DIAGNOSTIC LAPAROSCOPY     Lap chole.   ROBOTIC ASSISTED TOTAL HYSTERECTOMY Bilateral 09/29/2012   Procedure: ROBOTIC ASSISTED TOTAL HYSTERECTOMY WITH BILATERAL SALPINGECTOMY;  Surgeon: Marvene Staff, MD;  Location: [facility];  Service: Gynecology;  Laterality: Bilateral;  3 hrs.   TONSILLECTOMY     TONSILLECTOMY  1969   TOTAL HIP ARTHROPLASTY Left 08/21/2020   Procedure: LEFT TOTAL HIP ARTHROPLASTY ANTERIOR APPROACH;  Surgeon: Leandrew Koyanagi, MD;  Location: [facility];  Service: Orthopedics;  Laterality: Left;  3-C    There were no vitals filed for this visit.   Subjective Assessment - 09/13/20 1712     Subjective Deziyah is using the walker infrequently now.  She notes a L lateral lean which is being addressed with her current PT.    Pertinent History s/p THR anterior approach Frankey Shown MD    Patient Stated Goals Wants to be able  to drive by July 25 for daughter's camp.    Currently in Pain? Yes    Pain Score 4     Pain Location Hip    Pain Orientation Left    Pain Descriptors / Indicators Aching;Sore;Tightness    Pain Type Surgical pain    Pain Radiating Towards NA    Pain Onset 1 to 4 weeks ago    Pain Frequency Intermittent    Aggravating Factors  Prolonged postures and too much WB    Pain Relieving Factors Pain meds and change of position    Effect of Pain on Daily Activities Sleeping improving but still disturbed by hip pain    Multiple Pain Sites No                               [facility] Adult PT Treatment/Exercise - 09/13/20 0001       Therapeutic Activites    Therapeutic Activities ADL's    ADL's Dynamic steps/stairs 4, 6 and 8 inch with slow eccentrics      Neuro Re-ed    Neuro Re-ed Details  Tandem balance eyes open/turning head/eyes closed 3X each 20 seconds      Exercises   Exercises Knee/Hip  Knee/Hip Exercises: Machines for Strengthening   Cybex Leg Press 75# 2 sets of 10 L only slow eccentrics & 2 sets of 5 R same weight      Knee/Hip Exercises: Standing   Heel Raises Both;1 set;10 reps;3 seconds    Heel Raises Limitations Heel to toe raises NO HANDS    Hip Flexion AROM;Stengthening;Both;1 set;10 reps    Hip Flexion Limitations Standing marching    Other Standing Knee Exercises Hip hike in a doorway 2 sets of 10 for 3 seconds and hip abduction with pelvic stabilization 2 sets of 5 for 3 seconds    Other Standing Knee Exercises Standing trunk extension AROM 10X 3 seconds                    PT Education - 09/13/20 1714     Education Details Reviewed HEP with balance and functional progressions    Person(s) Educated Patient    Methods Explanation;Demonstration;Verbal cues    Comprehension Verbal cues required;Returned demonstration;Need further instruction;Verbalized understanding              PT Short Term Goals - 09/13/20 1715       PT  SHORT TERM GOAL #1   Title Pt will be indepenent in her initial HEP    Time 3    Period Weeks    Status Achieved    Target Date 09/29/20      PT SHORT TERM GOAL #2   Title Pt will be able to perform 5 time sit to stand with no UE support in </= 14 seconds    Time 3    Status On-going    Target Date 09/29/20               PT Long Term Goals - 09/13/20 1715       PT LONG TERM GOAL #1   Title Pt will be independent in advanced HEP.    Time 8    Period Weeks    Status On-going      PT LONG TERM GOAL #2   Title Pt will be able to perform functional squat to pick up 15 pounds from floor to counter height with pain </= 2/10.    Time 8    Period Weeks    Status On-going      PT LONG TERM GOAL #3   Title Pt will be able to amb community surfaces with no device with pain </= 2/10 for at least 20 minutes.    Time 8    Period Weeks    Status On-going      PT LONG TERM GOAL #4   Title Pt will improve FOTO score from 47% to 67%.    Time 8    Period Weeks    Status On-going                   Plan - 09/13/20 1716     Clinical Impression Statement Alyla is rarely using the walker.  L lateral lean with gait is due to hip abductors weakness which is being addressed with her clinic and home exercises.  Spent a lot of time on steps/stairs and balance today along with strength progressions.  Continue current POC to meet LTGs.    Examination-Activity Limitations Lift;Transfers;Other;Squat;Stairs;Stand    Examination-Participation Restrictions Other;Yard Work;Occupation;Cleaning    Stability/Clinical Decision Making Stable/Uncomplicated    Rehab Potential Excellent    PT Frequency 2x / week    PT Duration 8  weeks    PT Treatment/Interventions ADLs/Self Care Home Management;Cryotherapy;Electrical Stimulation;Ultrasound;Gait training;Stair training;Moist Heat;Functional mobility training;Therapeutic activities;Therapeutic exercise;Balance training;Neuromuscular  re-education;Patient/family education;Manual techniques;Passive range of motion;Taping    PT Next Visit Plan Hip strength, balance and functional progressions    PT Home Exercise Plan Access Code: [email or web address]  URL: [email or web address]  Date: 09/05/2020  Prepared by: Kearney Hard    Exercises  Hooklying Hamstring Stretch with Strap - 2-3 x daily - 7 x weekly - 3 sets - 3 reps - 30 seconds hold  Supine Hip Adduction Isometric with Ball - 2-3 x daily - 7 x weekly - 2 sets - 10 reps - 5 seconds hold  Standing March with Counter Support - 2-3 x daily - 7 x weekly - 2 sets - 10 reps  Heel Toe Raises with Unilateral Counter Support - 2-3 x daily - 7 x weekly - 2 sets - 10 reps  Sit to Stand - 2-3 x daily - 7 x weekly - 2 sets - 10 reps  Supine Bridge - 2-3 x daily - 7 x weekly - 2 sets - 10 reps - 5 seconds hold  Hooklying Clamshell with Resistance - 2-3 x daily - 7 x weekly - 2 sets - 10 reps - 5 seconds hold    Consulted and Agree with Plan of Care Patient             Patient will benefit from skilled therapeutic intervention in order to improve the following deficits and impairments:  Pain, Decreased mobility, Decreased strength, Increased edema, Decreased balance, Impaired flexibility, Decreased activity tolerance, Difficulty walking  Visit Diagnosis: Difficulty in walking, not elsewhere classified  Muscle weakness (generalized)  Localized edema  Pain in left hip     Problem List Patient Active Problem List   Diagnosis Date Noted   Status post total replacement of left hip 08/21/2020   Vitamin D deficiency 07/13/2020   Pure hypercholesterolemia 07/13/2020   Gastroesophageal reflux disease 07/13/2020   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   Allergic rhinitis due to pollen 07/13/2020   Pain of right heel 07/07/2020   Primary osteoarthritis of left hip 04/25/2020   Hypothyroidism 09/30/2019   Heartburn 09/30/2019   Left hip pain 08/06/2017   Cholelithiases  10/11/2010    Farley Ly PT, MPT 09/13/2020, 5:19 PM  [facility] [address] [place], [place], [address] Phone: [phone]   Fax:  [phone]  Name: Makyah Lavigne MRN: [id number] Date of Birth: 04/18/1965

## 2020-09-19 ENCOUNTER — Other Ambulatory Visit: Payer: Self-pay

## 2020-09-19 ENCOUNTER — Encounter: Payer: Self-pay | Admitting: Physical Therapy

## 2020-09-19 ENCOUNTER — Ambulatory Visit (INDEPENDENT_AMBULATORY_CARE_PROVIDER_SITE_OTHER): Payer: No Typology Code available for payment source | Admitting: Physical Therapy

## 2020-09-19 ENCOUNTER — Encounter: Payer: Self-pay | Admitting: Orthopaedic Surgery

## 2020-09-19 DIAGNOSIS — M25552 Pain in left hip: Secondary | ICD-10-CM

## 2020-09-19 DIAGNOSIS — R262 Difficulty in walking, not elsewhere classified: Secondary | ICD-10-CM | POA: Diagnosis not present

## 2020-09-19 DIAGNOSIS — R600 Localized edema: Secondary | ICD-10-CM

## 2020-09-19 DIAGNOSIS — M6281 Muscle weakness (generalized): Secondary | ICD-10-CM | POA: Diagnosis not present

## 2020-09-19 NOTE — Therapy (Signed)
[facility] [place], [place], [address] Phone: [phone]   Fax:  [phone]  Physical Therapy Treatment  Patient Details  Name: [name] MRN: [id number] Date of Birth: [birth year] Referring Provider (PT): Frankey Shown MD   Encounter Date: 09/19/2020   PT End of Session - 09/19/20 1442     Visit Number 4    Number of Visits 16    Date for PT Re-Evaluation 10/27/20    Progress Note Due on Visit 10    PT Start Time 1430    PT Stop Time 1510    PT Time Calculation (min) 40 min    Equipment Utilized During Treatment Gait belt    Activity Tolerance Patient tolerated treatment well;No increased pain    Behavior During Therapy WFL for tasks assessed/performed             Past Medical History:  Diagnosis Date   Generalized headaches    GERD (gastroesophageal reflux disease)    Hypothyroidism    Thyroid disease    hypo    Past Surgical History:  Procedure Laterality Date   ABDOMINAL HYSTERECTOMY N/A    Phreesia 08/30/2019   APPENDECTOMY  2003   CESAREAN SECTION  2005   CESAREAN SECTION N/A    Phreesia 08/30/2019   CHOLECYSTECTOMY     DIAGNOSTIC LAPAROSCOPY     Lap chole.   ROBOTIC ASSISTED TOTAL HYSTERECTOMY Bilateral 09/29/2012   Procedure: ROBOTIC ASSISTED TOTAL HYSTERECTOMY WITH BILATERAL SALPINGECTOMY;  Surgeon: Marvene Staff, MD;  Location: [facility];  Service: Gynecology;  Laterality: Bilateral;  3 hrs.   TONSILLECTOMY     TONSILLECTOMY  1969   TOTAL HIP ARTHROPLASTY Left 08/21/2020   Procedure: LEFT TOTAL HIP ARTHROPLASTY ANTERIOR APPROACH;  Surgeon: Leandrew Koyanagi, MD;  Location: [facility];  Service: Orthopedics;  Laterality: Left;  3-C    There were no vitals filed for this visit.   Subjective Assessment - 09/19/20 1437     Subjective Pt arriving today using no assistive device. Pt with mild antalgic gait. We discussed ability to drive as long as she is not on any prescription pain meds. Pt encouraged to  clear it with her MD per my chart.    Pertinent History s/p THR anterior approach Frankey Shown MD    Patient Stated Goals Wants to be able to drive by July 25 for daughter's camp.    Pain Score 3     Pain Location Hip    Pain Orientation Left    Pain Descriptors / Indicators Aching    Pain Type Surgical pain    Pain Onset 1 to 4 weeks ago                               [facility] Adult PT Treatment/Exercise - 09/19/20 0001       Neuro Re-ed    Neuro Re-ed Details  SLS L LE: 30 second holds with intermittent UE support, tandum gait, rocker board with intermittent UE support, backward walking, standing on Airex single leg x 3 holding 15-20 seconds, standing on airex both feet with eyes closed x 30 seconds      Exercises   Exercises Knee/Hip      Knee/Hip Exercises: Machines for Strengthening   Cybex Leg Press bilateral LE: 75# 2x15, Left LE only: 37# 2x15      Knee/Hip Exercises: Standing   Hip Abduction Stengthening;Both;2 sets;10 reps  Other Standing Knee Exercises TRX squats x 10      Knee/Hip Exercises: Seated   Other Seated Knee/Hip Exercises hamstring curls using green theraband 2x10 Left LE only                      PT Short Term Goals - 09/19/20 1443       PT SHORT TERM GOAL #1   Title Pt will be indepenent in her initial HEP    Status On-going      PT SHORT TERM GOAL #2   Title Pt will be able to perform 5 time sit to stand with no UE support in </= 14 seconds    Baseline 11 seconds with no UE support    Status Achieved               PT Long Term Goals - 09/19/20 1459       PT LONG TERM GOAL #1   Title Pt will be independent in advanced HEP.    Status On-going      PT LONG TERM GOAL #2   Title Pt will be able to perform functional squat to pick up 15 pounds from floor to counter height with pain </= 2/10.    Status On-going      PT LONG TERM GOAL #3   Title Pt will be able to amb community surfaces with no device with  pain </= 2/10 for at least 20 minutes.    Baseline Pt doing laps from her house to mailbox for 15 minutes reporting 3/10 pain in left hip      PT LONG TERM GOAL #4   Title Pt will improve FOTO score from 47% to 67%.    Status On-going                   Plan - 09/19/20 1513     Clinical Impression Statement Pt is making progress with LE strengtheing and reporting less pain with ADL's and functional mobility. Treatment focusing on dynamic balance toward LTG's set. Continue skilled PT to maximize function.    Examination-Activity Limitations Lift;Transfers;Other;Squat;Stairs;Stand    Examination-Participation Restrictions Other;Yard Work;Occupation;Cleaning    Stability/Clinical Decision Making Stable/Uncomplicated    Rehab Potential Excellent    PT Frequency 2x / week    PT Duration 8 weeks    PT Treatment/Interventions ADLs/Self Care Home Management;Cryotherapy;Electrical Stimulation;Ultrasound;Gait training;Stair training;Moist Heat;Functional mobility training;Therapeutic activities;Therapeutic exercise;Balance training;Neuromuscular re-education;Patient/family education;Manual techniques;Passive range of motion;Taping    PT Next Visit Plan Hip strength, balance and functional progressions    PT Home Exercise Plan Access Code: [email or web address]  URL: [email or web address]  Date: 09/05/2020  Prepared by: Kearney Hard    Exercises  Hooklying Hamstring Stretch with Strap - 2-3 x daily - 7 x weekly - 3 sets - 3 reps - 30 seconds hold  Supine Hip Adduction Isometric with Ball - 2-3 x daily - 7 x weekly - 2 sets - 10 reps - 5 seconds hold  Standing March with Counter Support - 2-3 x daily - 7 x weekly - 2 sets - 10 reps  Heel Toe Raises with Unilateral Counter Support - 2-3 x daily - 7 x weekly - 2 sets - 10 reps  Sit to Stand - 2-3 x daily - 7 x weekly - 2 sets - 10 reps  Supine Bridge - 2-3 x daily - 7 x weekly - 2 sets - 10 reps - 5 seconds hold  Hooklying Clamshell with Resistance  -  2-3 x daily - 7 x weekly - 2 sets - 10 reps - 5 seconds hold    Consulted and Agree with Plan of Care Patient             Patient will benefit from skilled therapeutic intervention in order to improve the following deficits and impairments:  Pain, Decreased mobility, Decreased strength, Increased edema, Decreased balance, Impaired flexibility, Decreased activity tolerance, Difficulty walking  Visit Diagnosis: Difficulty in walking, not elsewhere classified  Muscle weakness (generalized)  Localized edema  Pain in left hip     Problem List Patient Active Problem List   Diagnosis Date Noted   Status post total replacement of left hip 08/21/2020   Vitamin D deficiency 07/13/2020   Pure hypercholesterolemia 07/13/2020   Gastroesophageal reflux disease 07/13/2020   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   Allergic rhinitis due to pollen 07/13/2020   Pain of right heel 07/07/2020   Primary osteoarthritis of left hip 04/25/2020   Hypothyroidism 09/30/2019   Heartburn 09/30/2019   Left hip pain 08/06/2017   Cholelithiases 10/11/2010    Oretha Caprice, PT, MPT 09/19/2020, 3:16 PM  [facility] [address] [place], [place], [address] Phone: [phone]   Fax:  [phone]  Name: [name] MRN: [id number] Date of Birth: [birth year]

## 2020-09-21 ENCOUNTER — Other Ambulatory Visit: Payer: Self-pay

## 2020-09-21 ENCOUNTER — Encounter: Payer: Self-pay | Admitting: Physical Therapy

## 2020-09-21 ENCOUNTER — Ambulatory Visit (INDEPENDENT_AMBULATORY_CARE_PROVIDER_SITE_OTHER): Payer: No Typology Code available for payment source | Admitting: Physical Therapy

## 2020-09-21 DIAGNOSIS — R262 Difficulty in walking, not elsewhere classified: Secondary | ICD-10-CM | POA: Diagnosis not present

## 2020-09-21 DIAGNOSIS — R600 Localized edema: Secondary | ICD-10-CM

## 2020-09-21 DIAGNOSIS — M6281 Muscle weakness (generalized): Secondary | ICD-10-CM | POA: Diagnosis not present

## 2020-09-21 DIAGNOSIS — M25552 Pain in left hip: Secondary | ICD-10-CM

## 2020-09-21 NOTE — Therapy (Signed)
[facility] [place], [place], [address] Phone: [phone]   Fax:  [phone]  Physical Therapy Treatment  Patient Details  Name: [name] MRN: [id number] Date of Birth: 1965-10-08 Referring Provider (PT): Frankey Shown MD   Encounter Date: 09/21/2020   PT End of Session - 09/21/20 1509     Visit Number 5    Number of Visits 16    Date for PT Re-Evaluation 10/27/20    Progress Note Due on Visit 10    PT Start Time 2947    PT Stop Time 1508    PT Time Calculation (min) 40 min    Equipment Utilized During Treatment Gait belt    Activity Tolerance Patient tolerated treatment well;No increased pain    Behavior During Therapy WFL for tasks assessed/performed             Past Medical History:  Diagnosis Date   Generalized headaches    GERD (gastroesophageal reflux disease)    Hypothyroidism    Thyroid disease    hypo    Past Surgical History:  Procedure Laterality Date   ABDOMINAL HYSTERECTOMY N/A    Phreesia 08/30/2019   APPENDECTOMY  2003   CESAREAN SECTION  2005   CESAREAN SECTION N/A    Phreesia 08/30/2019   CHOLECYSTECTOMY     DIAGNOSTIC LAPAROSCOPY     Lap chole.   ROBOTIC ASSISTED TOTAL HYSTERECTOMY Bilateral 09/29/2012   Procedure: ROBOTIC ASSISTED TOTAL HYSTERECTOMY WITH BILATERAL SALPINGECTOMY;  Surgeon: Marvene Staff, MD;  Location: [facility];  Service: Gynecology;  Laterality: Bilateral;  3 hrs.   TONSILLECTOMY     TONSILLECTOMY  1969   TOTAL HIP ARTHROPLASTY Left 08/21/2020   Procedure: LEFT TOTAL HIP ARTHROPLASTY ANTERIOR APPROACH;  Surgeon: Leandrew Koyanagi, MD;  Location: [facility];  Service: Orthopedics;  Laterality: Left;  3-C    There were no vitals filed for this visit.   Subjective Assessment - 09/21/20 1431     Subjective doing better every day.  a little stiff today but pain is better    Pertinent History s/p THR anterior approach Frankey Shown MD    Patient Stated Goals Wants to be able to  drive by July 25 for daughter's camp.    Currently in Pain? Yes    Pain Score 2     Pain Location Hip    Pain Orientation Left    Pain Descriptors / Indicators Aching    Pain Type Surgical pain    Pain Onset 1 to 4 weeks ago    Pain Frequency Intermittent    Aggravating Factors  prolonged postures and too much WB    Pain Relieving Factors pain meds and change of position                               [facility] Adult PT Treatment/Exercise - 09/21/20 1433       Knee/Hip Exercises: Aerobic   Recumbent Bike L3 x 6 min      Knee/Hip Exercises: Machines for Strengthening   Cybex Leg Press bilateral LE: 100# 2x15, Left LE only: 50# 2x15      Knee/Hip Exercises: Standing   Heel Raises Both;20 reps    Hip Abduction Stengthening;Both;2 sets;10 reps    Abduction Limitations 3#    Hip Extension Stengthening;Both;20 reps;Knee straight    Extension Limitations 3#    Forward Step Up Both;20 reps;Hand Hold: 0;Step Height: 6"  Step Down Limitations LLE on 6" step with RLE heel tap downs x 20 reps                 Balance Exercises - 09/21/20 1459       Balance Exercises: Standing   SLS Eyes open;Solid surface;Intermittent upper extremity support   LLE only with ball toss up to 10 consecutive tosses   Balance Beam sidestepping and tandem walking forward/backward x 5 laps each                 PT Short Term Goals - 09/19/20 1443       PT SHORT TERM GOAL #1   Title Pt will be indepenent in her initial HEP    Status On-going      PT SHORT TERM GOAL #2   Title Pt will be able to perform 5 time sit to stand with no UE support in </= 14 seconds    Baseline 11 seconds with no UE support    Status Achieved               PT Long Term Goals - 09/19/20 1459       PT LONG TERM GOAL #1   Title Pt will be independent in advanced HEP.    Status On-going      PT LONG TERM GOAL #2   Title Pt will be able to perform functional squat to pick up 15 pounds  from floor to counter height with pain </= 2/10.    Status On-going      PT LONG TERM GOAL #3   Title Pt will be able to amb community surfaces with no device with pain </= 2/10 for at least 20 minutes.    Baseline Pt doing laps from her house to mailbox for 15 minutes reporting 3/10 pain in left hip      PT LONG TERM GOAL #4   Title Pt will improve FOTO score from 47% to 67%.    Status On-going                   Plan - 09/21/20 1509     Clinical Impression Statement Pt demonstrating excellent progress with balance and strengthening exercises.  Reports increased stiffness which is expected post op. Will continue to benefit from PT to maximize function.    Examination-Activity Limitations Lift;Transfers;Other;Squat;Stairs;Stand    Examination-Participation Restrictions Other;Yard Work;Occupation;Cleaning    Stability/Clinical Decision Making Stable/Uncomplicated    Rehab Potential Excellent    PT Frequency 2x / week    PT Duration 8 weeks    PT Treatment/Interventions ADLs/Self Care Home Management;Cryotherapy;Electrical Stimulation;Ultrasound;Gait training;Stair training;Moist Heat;Functional mobility training;Therapeutic activities;Therapeutic exercise;Balance training;Neuromuscular re-education;Patient/family education;Manual techniques;Passive range of motion;Taping    PT Next Visit Plan Hip strength, balance and functional progressions    PT Home Exercise Plan Access Code: [email or web address]    Consulted and Agree with Plan of Care Patient             Patient will benefit from skilled therapeutic intervention in order to improve the following deficits and impairments:  Pain, Decreased mobility, Decreased strength, Increased edema, Decreased balance, Impaired flexibility, Decreased activity tolerance, Difficulty walking  Visit Diagnosis: Difficulty in walking, not elsewhere classified  Muscle weakness (generalized)  Localized edema  Pain in left hip     Problem  List Patient Active Problem List   Diagnosis Date Noted   Status post total replacement of left hip 08/21/2020   Vitamin D deficiency 07/13/2020  Pure hypercholesterolemia 07/13/2020   Gastroesophageal reflux disease 07/13/2020   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   Allergic rhinitis due to pollen 07/13/2020   Pain of right heel 07/07/2020   Primary osteoarthritis of left hip 04/25/2020   Hypothyroidism 09/30/2019   Heartburn 09/30/2019   Left hip pain 08/06/2017   Cholelithiases 10/11/2010      [name], PT, DPT 09/21/20 3:11 PM     [facility] [address] [place], [place], [address] Phone: [phone]   Fax:  [phone]  Name: [name] MRN: [id number] Date of Birth: 05-14-1965

## 2020-09-25 ENCOUNTER — Other Ambulatory Visit: Payer: Self-pay

## 2020-09-25 ENCOUNTER — Encounter: Payer: Self-pay | Admitting: Physical Therapy

## 2020-09-25 ENCOUNTER — Ambulatory Visit (INDEPENDENT_AMBULATORY_CARE_PROVIDER_SITE_OTHER): Payer: No Typology Code available for payment source | Admitting: Physical Therapy

## 2020-09-25 DIAGNOSIS — M25552 Pain in left hip: Secondary | ICD-10-CM

## 2020-09-25 DIAGNOSIS — M6281 Muscle weakness (generalized): Secondary | ICD-10-CM | POA: Diagnosis not present

## 2020-09-25 DIAGNOSIS — R262 Difficulty in walking, not elsewhere classified: Secondary | ICD-10-CM | POA: Diagnosis not present

## 2020-09-25 DIAGNOSIS — R600 Localized edema: Secondary | ICD-10-CM

## 2020-09-25 NOTE — Therapy (Signed)
[place], [place], [address] Phone: [phone]   Fax:  [phone]  Physical Therapy Treatment  Patient Details  Name: [name] MRN: [id number] Date of Birth: March 20, 1965 Referring Provider (PT): Frankey Shown MD   Encounter Date: 09/25/2020   PT End of Session - 09/25/20 1434     Visit Number 6    Number of Visits 16    Date for PT Re-Evaluation 10/27/20    Progress Note Due on Visit 10    PT Start Time 6834    PT Stop Time 1505    PT Time Calculation (min) 40 min    Equipment Utilized During Treatment Gait belt    Activity Tolerance Patient tolerated treatment well;No increased pain    Behavior During Therapy WFL for tasks assessed/performed             Past Medical History:  Diagnosis Date   Generalized headaches    GERD (gastroesophageal reflux disease)    Hypothyroidism    Thyroid disease    hypo    Past Surgical History:  Procedure Laterality Date   ABDOMINAL HYSTERECTOMY N/A    Phreesia 08/30/2019   APPENDECTOMY  2003   CESAREAN SECTION  2005   CESAREAN SECTION N/A    Phreesia 08/30/2019   CHOLECYSTECTOMY     DIAGNOSTIC LAPAROSCOPY     Lap chole.   ROBOTIC ASSISTED TOTAL HYSTERECTOMY Bilateral 09/29/2012   Procedure: ROBOTIC ASSISTED TOTAL HYSTERECTOMY WITH BILATERAL SALPINGECTOMY;  Surgeon: Marvene Staff, MD;  Location: [facility];  Service: Gynecology;  Laterality: Bilateral;  3 hrs.   TONSILLECTOMY     TONSILLECTOMY  1969   TOTAL HIP ARTHROPLASTY Left 08/21/2020   Procedure: LEFT TOTAL HIP ARTHROPLASTY ANTERIOR APPROACH;  Surgeon: Leandrew Koyanagi, MD;  Location: [facility];  Service: Orthopedics;  Laterality: Left;  3-C    There were no vitals filed for this visit.   Subjective Assessment - 09/25/20 1432     Subjective Pt reporting 2/10 pain in hip today. Pt reporting she drove around her neighborhood without any difficulties. Pt reporting only using pain meds at night.    Pertinent  History s/p THR anterior approach Frankey Shown MD    Currently in Pain? Yes    Pain Score 2     Pain Location Hip    Pain Orientation Left    Pain Descriptors / Indicators Sore    Pain Type Surgical pain    Pain Onset 1 to 4 weeks ago                               [facility] Adult PT Treatment/Exercise - 09/25/20 0001       Knee/Hip Exercises: Aerobic   Nustep L5 x 7 minutes      Knee/Hip Exercises: Machines for Strengthening   Cybex Leg Press bilateral LE: 112# 2x15, Left LE only: 56# 2x15      Knee/Hip Exercises: Standing   Heel Raises Both;20 reps    Hip Abduction Stengthening;Both;2 sets;10 reps    Abduction Limitations 3#    Hip Extension Stengthening;Both;20 reps;Knee straight    Extension Limitations 3#    Forward Step Up Both;20 reps;Hand Hold: 0;Step Height: 6"    Step Down Limitations LLE on 6" step with RLE heel tap downs x 20 reps                 Balance Exercises - 09/25/20 0001  Balance Exercises: Standing   Standing Eyes Closed --   SLS on L eyes closed for 8 seconds best time   SLS Eyes open;Intermittent upper extremity support;10 secs;5 reps    Retro Gait Foam/compliant surface;5 reps    Sidestepping Foam/compliant support;5 reps   side stepping with green theraband around knees 20 feet x4   Other Standing Exercises braising front/back 20 feet x 4                 PT Short Term Goals - 09/25/20 1501       PT SHORT TERM GOAL #1   Title Pt will be indepenent in her initial HEP    Status Achieved      PT SHORT TERM GOAL #2   Title Pt will be able to perform 5 time sit to stand with no UE support in </= 14 seconds    Status Achieved               PT Long Term Goals - 09/25/20 1502       PT LONG TERM GOAL #1   Title Pt will be independent in advanced HEP.    Status On-going      PT LONG TERM GOAL #2   Title Pt will be able to perform functional squat to pick up 15 pounds from floor to counter height with  pain </= 2/10.    Status On-going      PT LONG TERM GOAL #3   Title Pt will be able to amb community surfaces with no device with pain </= 2/10 for at least 20 minutes.    Status On-going      PT LONG TERM GOAL #4   Title Pt will improve FOTO score from 47% to 67%.    Status On-going                   Plan - 09/25/20 1435     Clinical Impression Statement Pt reporting 2/10 pain today in her left hip. Pt is continuing to make progress with LE strengthening, ROM and dynamic balance. Pt feels like she is beginning to "turn the corner" with her mobility. Continue to progress with skilled PT to maximize function.    Examination-Activity Limitations Lift;Transfers;Other;Squat;Stairs;Stand    Examination-Participation Restrictions Other;Yard Work;Occupation;Cleaning    Stability/Clinical Decision Making Stable/Uncomplicated    Rehab Potential Excellent    PT Frequency 2x / week    PT Duration 8 weeks    PT Treatment/Interventions ADLs/Self Care Home Management;Cryotherapy;Electrical Stimulation;Ultrasound;Gait training;Stair training;Moist Heat;Functional mobility training;Therapeutic activities;Therapeutic exercise;Balance training;Neuromuscular re-education;Patient/family education;Manual techniques;Passive range of motion;Taping    PT Next Visit Plan Hip strength, balance and functional progressions    PT Home Exercise Plan Access Code: [email or web address]    Consulted and Agree with Plan of Care Patient             Patient will benefit from skilled therapeutic intervention in order to improve the following deficits and impairments:  Pain, Decreased mobility, Decreased strength, Increased edema, Decreased balance, Impaired flexibility, Decreased activity tolerance, Difficulty walking  Visit Diagnosis: Difficulty in walking, not elsewhere classified  Muscle weakness (generalized)  Localized edema  Pain in left hip     Problem List Patient Active Problem List   Diagnosis Date  Noted   Status post total replacement of left hip 08/21/2020   Vitamin D deficiency 07/13/2020   Pure hypercholesterolemia 07/13/2020   Gastroesophageal reflux disease 07/13/2020   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   Allergic  rhinitis due to pollen 07/13/2020   Pain of right heel 07/07/2020   Primary osteoarthritis of left hip 04/25/2020   Hypothyroidism 09/30/2019   Heartburn 09/30/2019   Left hip pain 08/06/2017   Cholelithiases 10/11/2010    Oretha Caprice, PT, MPT 09/25/2020, 3:10 PM  [facility] [address] [place], [place], [address] Phone: [phone]   Fax:  [phone]  Name: [name] MRN: [id number] Date of Birth: [birth year]

## 2020-09-27 ENCOUNTER — Ambulatory Visit (INDEPENDENT_AMBULATORY_CARE_PROVIDER_SITE_OTHER): Payer: No Typology Code available for payment source | Admitting: Physical Therapy

## 2020-09-27 ENCOUNTER — Encounter: Payer: Self-pay | Admitting: Physical Therapy

## 2020-09-27 ENCOUNTER — Other Ambulatory Visit: Payer: Self-pay

## 2020-09-27 DIAGNOSIS — M6281 Muscle weakness (generalized): Secondary | ICD-10-CM

## 2020-09-27 DIAGNOSIS — R600 Localized edema: Secondary | ICD-10-CM | POA: Diagnosis not present

## 2020-09-27 DIAGNOSIS — M25552 Pain in left hip: Secondary | ICD-10-CM | POA: Diagnosis not present

## 2020-09-27 DIAGNOSIS — R262 Difficulty in walking, not elsewhere classified: Secondary | ICD-10-CM | POA: Diagnosis not present

## 2020-09-27 NOTE — Therapy (Signed)
[place], [place], [address] Phone: [phone]   Fax:  [phone]  Physical Therapy Treatment  Patient Details  Name: [name] MRN: [id number] Date of Birth: 1965-11-06 Referring Provider (PT): Frankey Shown MD   Encounter Date: 09/27/2020   PT End of Session - 09/27/20 1507     Visit Number 7    Number of Visits 16    Date for PT Re-Evaluation 10/27/20    Progress Note Due on Visit 10    PT Start Time 2505    PT Stop Time 1506    PT Time Calculation (min) 38 min    Equipment Utilized During Treatment Gait belt    Activity Tolerance Patient tolerated treatment well;No increased pain    Behavior During Therapy WFL for tasks assessed/performed             Past Medical History:  Diagnosis Date   Generalized headaches    GERD (gastroesophageal reflux disease)    Hypothyroidism    Thyroid disease    hypo    Past Surgical History:  Procedure Laterality Date   ABDOMINAL HYSTERECTOMY N/A    Phreesia 08/30/2019   APPENDECTOMY  2003   CESAREAN SECTION  2005   CESAREAN SECTION N/A    Phreesia 08/30/2019   CHOLECYSTECTOMY     DIAGNOSTIC LAPAROSCOPY     Lap chole.   ROBOTIC ASSISTED TOTAL HYSTERECTOMY Bilateral 09/29/2012   Procedure: ROBOTIC ASSISTED TOTAL HYSTERECTOMY WITH BILATERAL SALPINGECTOMY;  Surgeon: Marvene Staff, MD;  Location: [facility];  Service: Gynecology;  Laterality: Bilateral;  3 hrs.   TONSILLECTOMY     TONSILLECTOMY  1969   TOTAL HIP ARTHROPLASTY Left 08/21/2020   Procedure: LEFT TOTAL HIP ARTHROPLASTY ANTERIOR APPROACH;  Surgeon: Leandrew Koyanagi, MD;  Location: [facility];  Service: Orthopedics;  Laterality: Left;  3-C    There were no vitals filed for this visit.   Subjective Assessment - 09/27/20 1427     Subjective feels balance and strength are biggest limitations but getting better    Pertinent History s/p THR anterior approach Frankey Shown MD    Patient Stated Goals Wants to be  able to drive by July 25 for daughter's camp.    Currently in Pain? Yes    Pain Score 2     Pain Location Hip    Pain Orientation Left    Pain Descriptors / Indicators Sore    Pain Type Surgical pain    Pain Onset 1 to 4 weeks ago    Pain Frequency Intermittent    Aggravating Factors  prolonged postures    Pain Relieving Factors repositioning, better in AM                               [facility] Adult PT Treatment/Exercise - 09/27/20 1431       Knee/Hip Exercises: Aerobic   Recumbent Bike L4 x 6 min      Knee/Hip Exercises: Machines for Strengthening   Cybex Leg Press bilateral LE: 125# 2x15, Left LE only: 75# 2x15      Knee/Hip Exercises: Standing   Heel Raises Both;20 reps    Hip Abduction Stengthening;Both;2 sets;10 reps    Abduction Limitations 4#    Hip Extension Stengthening;Both;20 reps;Knee straight    Extension Limitations 4#    Lunge Walking - Round Trips 30' x 2 - min cues for technique    SLS with Vectors LLE  with RLE ant/lat/post movement x 10 reps - level ground and foam    Other Standing Knee Exercises TRX squats 2 x 10; with Rt heel lift at end range for increased LLE weightbearing    Other Standing Knee Exercises SL deadlift with 10# KB 2x10 bil                 Balance Exercises - 09/27/20 1456       Balance Exercises: Standing   Tandem Stance Eyes open;Foam/compliant surface   head turns                PT Short Term Goals - 09/25/20 1501       PT SHORT TERM GOAL #1   Title Pt will be indepenent in her initial HEP    Status Achieved      PT SHORT TERM GOAL #2   Title Pt will be able to perform 5 time sit to stand with no UE support in </= 14 seconds    Status Achieved               PT Long Term Goals - 09/25/20 1502       PT LONG TERM GOAL #1   Title Pt will be independent in advanced HEP.    Status On-going      PT LONG TERM GOAL #2   Title Pt will be able to perform functional squat to pick up 15  pounds from floor to counter height with pain </= 2/10.    Status On-going      PT LONG TERM GOAL #3   Title Pt will be able to amb community surfaces with no device with pain </= 2/10 for at least 20 minutes.    Status On-going      PT LONG TERM GOAL #4   Title Pt will improve FOTO score from 47% to 67%.    Status On-going                   Plan - 09/27/20 1507     Clinical Impression Statement Continued session focusing on balance and strengthening with good response.  She's doing very well from a PT standpoint and anticipate early d/c from PT.    Examination-Activity Limitations Lift;Transfers;Other;Squat;Stairs;Stand    Examination-Participation Restrictions Other;Yard Work;Occupation;Cleaning    Stability/Clinical Decision Making Stable/Uncomplicated    Rehab Potential Excellent    PT Frequency 2x / week    PT Duration 8 weeks    PT Treatment/Interventions ADLs/Self Care Home Management;Cryotherapy;Electrical Stimulation;Ultrasound;Gait training;Stair training;Moist Heat;Functional mobility training;Therapeutic activities;Therapeutic exercise;Balance training;Neuromuscular re-education;Patient/family education;Manual techniques;Passive range of motion;Taping    PT Next Visit Plan Hip strength, balance and functional progressions, work on lifting    PT Home Exercise Plan Access Code: [email or web address]    Consulted and Agree with Plan of Care Patient             Patient will benefit from skilled therapeutic intervention in order to improve the following deficits and impairments:  Pain, Decreased mobility, Decreased strength, Increased edema, Decreased balance, Impaired flexibility, Decreased activity tolerance, Difficulty walking  Visit Diagnosis: Difficulty in walking, not elsewhere classified  Muscle weakness (generalized)  Localized edema  Pain in left hip     Problem List Patient Active Problem List   Diagnosis Date Noted   Status post total replacement of  left hip 08/21/2020   Vitamin D deficiency 07/13/2020   Pure hypercholesterolemia 07/13/2020   Gastroesophageal reflux disease 07/13/2020   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   Allergic rhinitis due to pollen 07/13/2020   Pain of right heel 07/07/2020   Primary osteoarthritis of left hip 04/25/2020   Hypothyroidism 09/30/2019   Heartburn 09/30/2019   Left hip pain 08/06/2017   Cholelithiases 10/11/2010     [name], PT, DPT 09/27/20 3:09 PM     [facility] [address] [place], [place], [address] Phone: [phone]   Fax:  [phone]  Name: [name] MRN: [id number] Date of Birth: 1965/07/25

## 2020-09-29 ENCOUNTER — Other Ambulatory Visit: Payer: Self-pay | Admitting: Physician Assistant

## 2020-09-29 ENCOUNTER — Encounter: Payer: Self-pay | Admitting: Orthopaedic Surgery

## 2020-09-29 MED ORDER — AMOXICILLIN 500 MG PO CAPS: ORAL_CAPSULE | ORAL | 2 refills | Status: DC

## 2020-09-29 NOTE — Telephone Encounter (Signed)
Sent in amoxicillin.  Step down from tramadol is tylenol/nsaids.  Happy to call in one of those if she wishes?

## 2020-10-02 ENCOUNTER — Ambulatory Visit (INDEPENDENT_AMBULATORY_CARE_PROVIDER_SITE_OTHER): Payer: No Typology Code available for payment source | Admitting: Physical Therapy

## 2020-10-02 ENCOUNTER — Other Ambulatory Visit: Payer: Self-pay

## 2020-10-02 ENCOUNTER — Encounter: Payer: Self-pay | Admitting: Physical Therapy

## 2020-10-02 DIAGNOSIS — M6281 Muscle weakness (generalized): Secondary | ICD-10-CM

## 2020-10-02 DIAGNOSIS — M25552 Pain in left hip: Secondary | ICD-10-CM

## 2020-10-02 DIAGNOSIS — R600 Localized edema: Secondary | ICD-10-CM | POA: Diagnosis not present

## 2020-10-02 DIAGNOSIS — R262 Difficulty in walking, not elsewhere classified: Secondary | ICD-10-CM

## 2020-10-02 MED ORDER — MELOXICAM 7.5 MG PO TABS: 7.5000 mg | ORAL_TABLET | Freq: Every day | ORAL | 2 refills | Status: DC

## 2020-10-02 NOTE — Therapy (Signed)
[place], [place], [address] Phone: [phone]   Fax:  [phone]  Physical Therapy Treatment  Patient Details  Name: [name] MRN: [id number] Date of Birth: March 28, 1965 Referring Provider (PT): Frankey Shown MD   Encounter Date: 10/02/2020   PT End of Session - 10/02/20 1440     Visit Number 8    Number of Visits 16    Date for PT Re-Evaluation 10/27/20    Progress Note Due on Visit 10    PT Start Time [id number]    PT Stop Time 1513    PT Time Calculation (min) 40 min    Equipment Utilized During Treatment Gait belt    Activity Tolerance Patient tolerated treatment well;No increased pain    Behavior During Therapy WFL for tasks assessed/performed             Past Medical History:  Diagnosis Date   Generalized headaches    GERD (gastroesophageal reflux disease)    Hypothyroidism    Thyroid disease    hypo    Past Surgical History:  Procedure Laterality Date   ABDOMINAL HYSTERECTOMY N/A    Phreesia 08/30/2019   APPENDECTOMY  2003   CESAREAN SECTION  2005   CESAREAN SECTION N/A    Phreesia 08/30/2019   CHOLECYSTECTOMY     DIAGNOSTIC LAPAROSCOPY     Lap chole.   ROBOTIC ASSISTED TOTAL HYSTERECTOMY Bilateral 09/29/2012   Procedure: ROBOTIC ASSISTED TOTAL HYSTERECTOMY WITH BILATERAL SALPINGECTOMY;  Surgeon: Marvene Staff, MD;  Location: [facility];  Service: Gynecology;  Laterality: Bilateral;  3 hrs.   TONSILLECTOMY     TONSILLECTOMY  1969   TOTAL HIP ARTHROPLASTY Left 08/21/2020   Procedure: LEFT TOTAL HIP ARTHROPLASTY ANTERIOR APPROACH;  Surgeon: Leandrew Koyanagi, MD;  Location: [facility];  Service: Orthopedics;  Laterality: Left;  3-C    There were no vitals filed for this visit.   Subjective Assessment - 10/02/20 1438     Subjective Pt arriving reporting 1/10 pain in left hip. Pt stating she walked around more yesterday.    Pertinent History s/p THR anterior approach Frankey Shown MD    Patient Stated  Goals Wants to be able to drive by July 25 for daughter's camp.    Currently in Pain? Yes    Pain Score 1     Pain Location Hip    Pain Orientation Left    Pain Descriptors / Indicators Sore    Pain Type Surgical pain    Pain Onset 1 to 4 weeks ago                               [facility] Adult PT Treatment/Exercise - 10/02/20 0001       Knee/Hip Exercises: Aerobic   Recumbent Bike L6 x 8 minutes      Knee/Hip Exercises: Machines for Strengthening   Cybex Leg Press bilateral LE's 125# x20, Left LE only: 61# x20, 75# 2x10      Knee/Hip Exercises: Standing   Lateral Step Up Both;1 set;15 reps;Hand Hold: 1;Step Height: 6";Limitations    Lateral Step Up Limitations with hip abduction lift from opposite extremity    Lunge Walking - Round Trips lunges x 20 feet with green theraband around thighs    SLS with Vectors vectors with sliding disc with green theraband around ankle x 20 using each LE    Other Standing Knee Exercises TRX 2x10  Other Standing Knee Exercises side stepping with green theraband 20 feet x 4                      PT Short Term Goals - 10/02/20 1517       PT SHORT TERM GOAL #1   Title Pt will be indepenent in her initial HEP    Status Achieved      PT SHORT TERM GOAL #2   Title Pt will be able to perform 5 time sit to stand with no UE support in </= 14 seconds    Status Achieved               PT Long Term Goals - 10/02/20 1517       PT LONG TERM GOAL #1   Title Pt will be independent in advanced HEP.    Status On-going      PT LONG TERM GOAL #2   Title Pt will be able to perform functional squat to pick up 15 pounds from floor to counter height with pain </= 2/10.    Status On-going      PT LONG TERM GOAL #3   Title Pt will be able to amb community surfaces with no device with pain </= 2/10 for at least 20 minutes.    Status On-going      PT LONG TERM GOAL #4   Title Pt will improve FOTO score from 47% to 67%.     Status On-going                   Plan - 10/02/20 1450     Clinical Impression Statement Pt tolerating exercises well progressing toward more dynamic balance and strengthening. Continue skilled PT, still anticipating early discharge in the next few visits.    Examination-Activity Limitations Lift;Transfers;Other;Squat;Stairs;Stand    Examination-Participation Restrictions Other;Yard Work;Occupation;Cleaning    Stability/Clinical Decision Making Stable/Uncomplicated    Rehab Potential Excellent    PT Frequency 2x / week    PT Duration 8 weeks    PT Treatment/Interventions ADLs/Self Care Home Management;Cryotherapy;Electrical Stimulation;Ultrasound;Gait training;Stair training;Moist Heat;Functional mobility training;Therapeutic activities;Therapeutic exercise;Balance training;Neuromuscular re-education;Patient/family education;Manual techniques;Passive range of motion;Taping    PT Next Visit Plan lifting, dynamic balance. hip strength    PT Home Exercise Plan Access Code: [email or web address]    Consulted and Agree with Plan of Care Patient             Patient will benefit from skilled therapeutic intervention in order to improve the following deficits and impairments:  Pain, Decreased mobility, Decreased strength, Increased edema, Decreased balance, Impaired flexibility, Decreased activity tolerance, Difficulty walking  Visit Diagnosis: Difficulty in walking, not elsewhere classified  Muscle weakness (generalized)  Localized edema  Pain in left hip     Problem List Patient Active Problem List   Diagnosis Date Noted   Status post total replacement of left hip 08/21/2020   Vitamin D deficiency 07/13/2020   Pure hypercholesterolemia 07/13/2020   Gastroesophageal reflux disease 07/13/2020   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   Allergic rhinitis due to pollen 07/13/2020   Pain of right heel 07/07/2020   Primary osteoarthritis of left hip 04/25/2020   Hypothyroidism  09/30/2019   Heartburn 09/30/2019   Left hip pain 08/06/2017   Cholelithiases 10/11/2010    Oretha Caprice, PT, MPT 10/02/2020, 3:18 PM  [facility] [address] [place], [place], [address] Phone: [phone]   Fax:  [phone]  Name: [name] MRN:  [id number] Date of Birth: 1965-05-23

## 2020-10-02 NOTE — Telephone Encounter (Signed)
Can you just send in mobic 7.5 once daily #30 two refills

## 2020-10-04 ENCOUNTER — Encounter: Payer: Self-pay | Admitting: Physical Therapy

## 2020-10-04 ENCOUNTER — Other Ambulatory Visit: Payer: Self-pay

## 2020-10-04 ENCOUNTER — Ambulatory Visit (INDEPENDENT_AMBULATORY_CARE_PROVIDER_SITE_OTHER): Payer: No Typology Code available for payment source | Admitting: Physical Therapy

## 2020-10-04 DIAGNOSIS — R262 Difficulty in walking, not elsewhere classified: Secondary | ICD-10-CM | POA: Diagnosis not present

## 2020-10-04 DIAGNOSIS — M6281 Muscle weakness (generalized): Secondary | ICD-10-CM

## 2020-10-04 DIAGNOSIS — M25552 Pain in left hip: Secondary | ICD-10-CM | POA: Diagnosis not present

## 2020-10-04 DIAGNOSIS — R600 Localized edema: Secondary | ICD-10-CM

## 2020-10-04 NOTE — Therapy (Signed)
[facility] [place], [place], [address] Phone: [phone]   Fax:  [phone]  Physical Therapy Treatment  Patient Details  Name: [name] MRN: [id number] Date of Birth: [birth year] Referring Provider (PT): Frankey Shown MD   Encounter Date: 10/04/2020   PT End of Session - 10/04/20 1547     Visit Number 9    Number of Visits 16    Date for PT Re-Evaluation 10/27/20    Progress Note Due on Visit 10    PT Start Time 1510    PT Stop Time [id number]    PT Time Calculation (min) 38 min    Equipment Utilized During Treatment Gait belt    Activity Tolerance Patient tolerated treatment well;No increased pain    Behavior During Therapy WFL for tasks assessed/performed             Past Medical History:  Diagnosis Date   Generalized headaches    GERD (gastroesophageal reflux disease)    Hypothyroidism    Thyroid disease    hypo    Past Surgical History:  Procedure Laterality Date   ABDOMINAL HYSTERECTOMY N/A    Phreesia 08/30/2019   APPENDECTOMY  2003   CESAREAN SECTION  2005   CESAREAN SECTION N/A    Phreesia 08/30/2019   CHOLECYSTECTOMY     DIAGNOSTIC LAPAROSCOPY     Lap chole.   ROBOTIC ASSISTED TOTAL HYSTERECTOMY Bilateral 09/29/2012   Procedure: ROBOTIC ASSISTED TOTAL HYSTERECTOMY WITH BILATERAL SALPINGECTOMY;  Surgeon: Marvene Staff, MD;  Location: [facility];  Service: Gynecology;  Laterality: Bilateral;  3 hrs.   TONSILLECTOMY     TONSILLECTOMY  1969   TOTAL HIP ARTHROPLASTY Left 08/21/2020   Procedure: LEFT TOTAL HIP ARTHROPLASTY ANTERIOR APPROACH;  Surgeon: Leandrew Koyanagi, MD;  Location: [facility];  Service: Orthopedics;  Laterality: Left;  3-C    There were no vitals filed for this visit.   Subjective Assessment - 10/04/20 1513     Subjective doing well; no complaints    Pertinent History s/p THR anterior approach Frankey Shown MD    Patient Stated Goals Wants to be able to drive by July 25 for daughter's camp.     Currently in Pain? Yes    Pain Score 1     Pain Location Hip    Pain Orientation Left    Pain Descriptors / Indicators Sore    Pain Type Surgical pain    Pain Onset 1 to 4 weeks ago    Pain Frequency Intermittent    Aggravating Factors  prolonged positioning    Pain Relieving Factors repositioning, better in AM                               [facility] Adult PT Treatment/Exercise - 10/04/20 1521       Knee/Hip Exercises: Aerobic   Recumbent Bike L6 x 8 minutes      Knee/Hip Exercises: Machines for Strengthening   Cybex Leg Press bilateral LE's 125# 3x10, Left LE only: 75# 3x10      Knee/Hip Exercises: Standing   Side Lunges Both;1 set;10 reps    Side Lunges Limitations 10# KB    Step Down Limitations LLE on 6" step with RLE heel tap downs x 20 reps    SLS with Vectors LLE with slider fwd/lateral/back x 10 reps each with squat    Other Standing Knee Exercises SL deadlift with 10# KB 2x10 bil  PT Short Term Goals - 10/02/20 1517       PT SHORT TERM GOAL #1   Title Pt will be indepenent in her initial HEP    Status Achieved      PT SHORT TERM GOAL #2   Title Pt will be able to perform 5 time sit to stand with no UE support in </= 14 seconds    Status Achieved               PT Long Term Goals - 10/02/20 1517       PT LONG TERM GOAL #1   Title Pt will be independent in advanced HEP.    Status On-going      PT LONG TERM GOAL #2   Title Pt will be able to perform functional squat to pick up 15 pounds from floor to counter height with pain </= 2/10.    Status On-going      PT LONG TERM GOAL #3   Title Pt will be able to amb community surfaces with no device with pain </= 2/10 for at least 20 minutes.    Status On-going      PT LONG TERM GOAL #4   Title Pt will improve FOTO score from 47% to 67%.    Status On-going                   Plan - 10/04/20 1547     Clinical Impression Statement Pt  continues to benefit from PT and demonstrates excellent strength and balance reactions.  Will plan to update HEP next visit to progress strengthening exercises.    Examination-Activity Limitations Lift;Transfers;Other;Squat;Stairs;Stand    Examination-Participation Restrictions Other;Yard Work;Occupation;Cleaning    Stability/Clinical Decision Making Stable/Uncomplicated    Rehab Potential Excellent    PT Frequency 2x / week    PT Duration 8 weeks    PT Treatment/Interventions ADLs/Self Care Home Management;Cryotherapy;Electrical Stimulation;Ultrasound;Gait training;Stair training;Moist Heat;Functional mobility training;Therapeutic activities;Therapeutic exercise;Balance training;Neuromuscular re-education;Patient/family education;Manual techniques;Passive range of motion;Taping    PT Next Visit Plan lifting, dynamic balance. hip strength; update HEP to include high level strengthening exercises    PT Home Exercise Plan Access Code: [email or web address]    Consulted and Agree with Plan of Care Patient             Patient will benefit from skilled therapeutic intervention in order to improve the following deficits and impairments:  Pain, Decreased mobility, Decreased strength, Increased edema, Decreased balance, Impaired flexibility, Decreased activity tolerance, Difficulty walking  Visit Diagnosis: Difficulty in walking, not elsewhere classified  Muscle weakness (generalized)  Localized edema  Pain in left hip     Problem List Patient Active Problem List   Diagnosis Date Noted   Status post total replacement of left hip 08/21/2020   Vitamin D deficiency 07/13/2020   Pure hypercholesterolemia 07/13/2020   Gastroesophageal reflux disease 07/13/2020   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   Allergic rhinitis due to pollen 07/13/2020   Pain of right heel 07/07/2020   Primary osteoarthritis of left hip 04/25/2020   Hypothyroidism 09/30/2019   Heartburn 09/30/2019   Left hip pain  08/06/2017   Cholelithiases 10/11/2010     [name], PT, DPT 10/04/20 3:50 PM    [facility] [address] [place], [place], [address] Phone: [phone]   Fax:  [phone]  Name: [name] MRN: [id number] Date of Birth: Nov 30, 1965

## 2020-10-09 ENCOUNTER — Ambulatory Visit (INDEPENDENT_AMBULATORY_CARE_PROVIDER_SITE_OTHER): Payer: No Typology Code available for payment source | Admitting: Physical Therapy

## 2020-10-09 ENCOUNTER — Other Ambulatory Visit: Payer: Self-pay

## 2020-10-09 ENCOUNTER — Encounter: Payer: Self-pay | Admitting: Physical Therapy

## 2020-10-09 DIAGNOSIS — R600 Localized edema: Secondary | ICD-10-CM | POA: Diagnosis not present

## 2020-10-09 DIAGNOSIS — M25552 Pain in left hip: Secondary | ICD-10-CM | POA: Diagnosis not present

## 2020-10-09 DIAGNOSIS — M6281 Muscle weakness (generalized): Secondary | ICD-10-CM

## 2020-10-09 DIAGNOSIS — R262 Difficulty in walking, not elsewhere classified: Secondary | ICD-10-CM | POA: Diagnosis not present

## 2020-10-09 NOTE — Therapy (Signed)
[facility] [place], [place], [address] Phone: [phone]   Fax:  [phone]  Physical Therapy Treatment  Patient Details  Name: [name] MRN: [id number] Date of Birth: 1965/12/10 Referring Provider (PT): Frankey Shown MD   Encounter Date: 10/09/2020   PT End of Session - 10/09/20 1434     Visit Number 10    Number of Visits 16    Date for PT Re-Evaluation 10/27/20    Progress Note Due on Visit 20    PT Start Time 1430    PT Stop Time 1510    PT Time Calculation (min) 40 min    Equipment Utilized During Treatment Gait belt    Activity Tolerance Patient tolerated treatment well;No increased pain    Behavior During Therapy WFL for tasks assessed/performed             Past Medical History:  Diagnosis Date   Generalized headaches    GERD (gastroesophageal reflux disease)    Hypothyroidism    Thyroid disease    hypo    Past Surgical History:  Procedure Laterality Date   ABDOMINAL HYSTERECTOMY N/A    Phreesia 08/30/2019   APPENDECTOMY  2003   CESAREAN SECTION  2005   CESAREAN SECTION N/A    Phreesia 08/30/2019   CHOLECYSTECTOMY     DIAGNOSTIC LAPAROSCOPY     Lap chole.   ROBOTIC ASSISTED TOTAL HYSTERECTOMY Bilateral 09/29/2012   Procedure: ROBOTIC ASSISTED TOTAL HYSTERECTOMY WITH BILATERAL SALPINGECTOMY;  Surgeon: Marvene Staff, MD;  Location: [facility];  Service: Gynecology;  Laterality: Bilateral;  3 hrs.   TONSILLECTOMY     TONSILLECTOMY  1969   TOTAL HIP ARTHROPLASTY Left 08/21/2020   Procedure: LEFT TOTAL HIP ARTHROPLASTY ANTERIOR APPROACH;  Surgeon: Leandrew Koyanagi, MD;  Location: [facility];  Service: Orthopedics;  Laterality: Left;  3-C    There were no vitals filed for this visit.   Subjective Assessment - 10/09/20 1434     Subjective 1/10 pain reported today.    Pertinent History s/p THR anterior approach Frankey Shown MD    Patient Stated Goals Wants to be able to drive by July 25 for daughter's camp.     Currently in Pain? Yes    Pain Score 1     Pain Location Hip    Pain Orientation Left    Pain Descriptors / Indicators Sore                [facility] PT Assessment - 10/09/20 0001       Assessment   Medical Diagnosis m16.12 OA left hip    Referring Provider (PT) Frankey Shown MD    Onset Date/Surgical Date 08/21/20    Hand Dominance Right      Observation/Other Assessments   Focus on Therapeutic Outcomes (FOTO)  67% (predicted 67)      AROM   Right Hip Flexion 110    Right Hip External Rotation  40    Right Hip Internal Rotation  30    Right Hip ABduction 54    Left Hip Flexion 106    Left Hip External Rotation  38    Left Hip Internal Rotation  28    Left Hip ABduction 50      Strength   Strength Assessment Site Hip    Right/Left Hip Right;Left    Right Hip Flexion 5/5    Right Hip Extension 5/5    Right Hip ABduction 5/5    Right Hip ADduction  5/5    Left Hip Flexion 5/5    Left Hip ABduction 5/5    Left Hip ADduction 5/5      Transfers   Five time sit to stand comments  8 seconds with No UE support      Ambulation/Gait   Assistive device None    Gait Pattern Step-through pattern                           [facility] Adult PT Treatment/Exercise - 10/09/20 0001       Knee/Hip Exercises: Aerobic   Recumbent Bike L6 x 8 minutes      Knee/Hip Exercises: Machines for Strengthening   Cybex Leg Press bilateral LE's 125# 3x10, Left LE only: 75# 3x10      Knee/Hip Exercises: Standing   Side Lunges Both;1 set;10 reps    Side Lunges Limitations 10# KB    Step Down Limitations LLE on 6" step with RLE heel tap downs x 20 reps    SLS with Vectors SLS on Airex: reaching toward 3 cones with oppostie foot   performed with bilateral LE's and stand by assistance   Other Standing Knee Exercises lunge with trunk rotation each direction x 20    Other Standing Knee Exercises SL deadlift with 10# KB 2x10 bil                      PT Short Term  Goals - 10/09/20 1435       PT SHORT TERM GOAL #1   Title Pt will be indepenent in her initial HEP    Status Achieved      PT SHORT TERM GOAL #2   Title Pt will be able to perform 5 time sit to stand with no UE support in </= 14 seconds    Status Achieved               PT Long Term Goals - 10/09/20 1435       PT LONG TERM GOAL #1   Title Pt will be independent in advanced HEP.    Status On-going      PT LONG TERM GOAL #2   Title Pt will be able to perform functional squat to pick up 15 pounds from floor to counter height with pain </= 2/10.    Status On-going      PT LONG TERM GOAL #3   Title Pt will be able to amb community surfaces with no device with pain </= 2/10 for at least 20 minutes.    Status On-going      PT LONG TERM GOAL #4   Title Pt will improve FOTO score from 47% to 67%.    Baseline 67% on 10/09/2020    Status Achieved                   Plan - 10/09/20 1506     Clinical Impression Statement Pt has made great progress toward goals set. Pt has improved her FOTO score to 67% function from 47%. Pt has improved her left hip AROM and strength is grossly 5/5. Continue to progress with SLS activities and plan discharge in next couple of visits.    Examination-Activity Limitations Lift;Transfers;Other;Squat;Stairs;Stand    Examination-Participation Restrictions Other;Yard Work;Occupation;Cleaning    Stability/Clinical Decision Making Stable/Uncomplicated    Rehab Potential Excellent    PT Frequency 2x / week    PT Duration 8 weeks  PT Treatment/Interventions ADLs/Self Care Home Management;Cryotherapy;Electrical Stimulation;Ultrasound;Gait training;Stair training;Moist Heat;Functional mobility training;Therapeutic activities;Therapeutic exercise;Balance training;Neuromuscular re-education;Patient/family education;Manual techniques;Passive range of motion;Taping    PT Next Visit Plan lifting, dynamic balance using balance pods, hip strength; update HEP  to include high level strengthening exercises    PT Home Exercise Plan Access Code: [email or web address]    Consulted and Agree with Plan of Care Patient             Patient will benefit from skilled therapeutic intervention in order to improve the following deficits and impairments:  Pain, Decreased mobility, Decreased strength, Increased edema, Decreased balance, Impaired flexibility, Decreased activity tolerance, Difficulty walking  Visit Diagnosis: Difficulty in walking, not elsewhere classified  Muscle weakness (generalized)  Localized edema  Pain in left hip     Problem List Patient Active Problem List   Diagnosis Date Noted   Status post total replacement of left hip 08/21/2020   Vitamin D deficiency 07/13/2020   Pure hypercholesterolemia 07/13/2020   Gastroesophageal reflux disease 07/13/2020   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   Allergic rhinitis due to pollen 07/13/2020   Pain of right heel 07/07/2020   Primary osteoarthritis of left hip 04/25/2020   Hypothyroidism 09/30/2019   Heartburn 09/30/2019   Left hip pain 08/06/2017   Cholelithiases 10/11/2010    Oretha Caprice, PT, MPT 10/09/2020, 3:12 PM  [facility] [address] [place], [place], [address] Phone: [phone]   Fax:  [phone]  Name: [name] MRN: [id number] Date of Birth: [birth year]

## 2020-10-11 ENCOUNTER — Ambulatory Visit (INDEPENDENT_AMBULATORY_CARE_PROVIDER_SITE_OTHER): Payer: No Typology Code available for payment source | Admitting: Physical Therapy

## 2020-10-11 ENCOUNTER — Encounter: Payer: Self-pay | Admitting: Physical Therapy

## 2020-10-11 ENCOUNTER — Other Ambulatory Visit: Payer: Self-pay

## 2020-10-11 DIAGNOSIS — M6281 Muscle weakness (generalized): Secondary | ICD-10-CM | POA: Diagnosis not present

## 2020-10-11 DIAGNOSIS — M25552 Pain in left hip: Secondary | ICD-10-CM

## 2020-10-11 DIAGNOSIS — R262 Difficulty in walking, not elsewhere classified: Secondary | ICD-10-CM

## 2020-10-11 DIAGNOSIS — R600 Localized edema: Secondary | ICD-10-CM | POA: Diagnosis not present

## 2020-10-11 NOTE — Therapy (Signed)
[facility] [place], [place], [address] Phone: [phone]   Fax:  [phone]  Physical Therapy Treatment  Patient Details  Name: [name] MRN: [id number] Date of Birth: [birth year] Referring Provider (PT): Sara Shown MD   Encounter Date: 10/11/2020   PT End of Session - 10/11/20 1503     Visit Number 11    Number of Visits 16    Date for PT Re-Evaluation 10/27/20    Progress Note Due on Visit 20    PT Start Time [id number]    PT Stop Time 1503    PT Time Calculation (min) 38 min    Equipment Utilized During Treatment Gait belt    Activity Tolerance Patient tolerated treatment well;No increased pain    Behavior During Therapy WFL for tasks assessed/performed             Past Medical History:  Diagnosis Date   Generalized headaches    GERD (gastroesophageal reflux disease)    Hypothyroidism    Thyroid disease    hypo    Past Surgical History:  Procedure Laterality Date   ABDOMINAL HYSTERECTOMY N/A    Phreesia 08/30/2019   APPENDECTOMY  2003   CESAREAN SECTION  2005   CESAREAN SECTION N/A    Phreesia 08/30/2019   CHOLECYSTECTOMY     DIAGNOSTIC LAPAROSCOPY     Lap chole.   ROBOTIC ASSISTED TOTAL HYSTERECTOMY Bilateral 09/29/2012   Procedure: ROBOTIC ASSISTED TOTAL HYSTERECTOMY WITH BILATERAL SALPINGECTOMY;  Surgeon: Marvene Staff, MD;  Location: [facility];  Service: Gynecology;  Laterality: Bilateral;  3 hrs.   TONSILLECTOMY     TONSILLECTOMY  1969   TOTAL HIP ARTHROPLASTY Left 08/21/2020   Procedure: LEFT TOTAL HIP ARTHROPLASTY ANTERIOR APPROACH;  Surgeon: Leandrew Koyanagi, MD;  Location: [facility];  Service: Orthopedics;  Laterality: Left;  3-C    There were no vitals filed for this visit.   Subjective Assessment - 10/11/20 1427     Subjective walked her dog which was about a mile and did well    Pertinent History s/p THR anterior approach Sara Shown MD    Patient Stated Goals Wants to be able to drive by July  25 for daughter's camp.    Currently in Pain? No/denies    Pain Score 0-No pain                               [facility] Adult PT Treatment/Exercise - 10/11/20 1428       Knee/Hip Exercises: Aerobic   Recumbent Bike L7 x 8 minutes      Knee/Hip Exercises: Machines for Strengthening   Cybex Leg Press bilateral LE's 125# 3x10, Left LE only: 75# 3x10      Knee/Hip Exercises: Standing   Side Lunges Both;10 reps;2 sets    Side Lunges Limitations 10# KB    Forward Step Up Left;2 sets;10 reps    Forward Step Up Limitations onto BOSU with 2 sec SLS hold    Functional Squat 2 sets;10 reps    Functional Squat Limitations on BOSU    Other Standing Knee Exercises marching with SLS hold 20'x2, then added ball toss for 20'x2    Other Standing Knee Exercises SL deadlift with 10# KB 2x10 bil                 Balance Exercises - 10/11/20 1442       Balance Exercises: Standing  Balance Beam sidestepping and tandem walking forward/backward x 5 laps each                 PT Short Term Goals - 10/09/20 1435       PT SHORT TERM GOAL #1   Title Pt will be indepenent in her initial HEP    Status Achieved      PT SHORT TERM GOAL #2   Title Pt will be able to perform 5 time sit to stand with no UE support in </= 14 seconds    Status Achieved               PT Long Term Goals - 10/09/20 1435       PT LONG TERM GOAL #1   Title Pt will be independent in advanced HEP.    Status On-going      PT LONG TERM GOAL #2   Title Pt will be able to perform functional squat to pick up 15 pounds from floor to counter height with pain </= 2/10.    Status On-going      PT LONG TERM GOAL #3   Title Pt will be able to amb community surfaces with no device with pain </= 2/10 for at least 20 minutes.    Status On-going      PT LONG TERM GOAL #4   Title Pt will improve FOTO score from 47% to 67%.    Baseline 67% on 10/09/2020    Status Achieved                    Plan - 10/11/20 1504     Clinical Impression Statement Pt tolerated session well today focusing on strengthening and balance activities.  Anticipate d/c next week.    Examination-Activity Limitations Lift;Transfers;Other;Squat;Stairs;Stand    Examination-Participation Restrictions Other;Yard Work;Occupation;Cleaning    Stability/Clinical Decision Making Stable/Uncomplicated    Rehab Potential Excellent    PT Frequency 2x / week    PT Duration 8 weeks    PT Treatment/Interventions ADLs/Self Care Home Management;Cryotherapy;Electrical Stimulation;Ultrasound;Gait training;Stair training;Moist Heat;Functional mobility training;Therapeutic activities;Therapeutic exercise;Balance training;Neuromuscular re-education;Patient/family education;Manual techniques;Passive range of motion;Taping    PT Next Visit Plan finalize HEP and plan for d/c next 1-2 visits    PT Home Exercise Plan Access Code: [email or web address]    Consulted and Agree with Plan of Care Patient             Patient will benefit from skilled therapeutic intervention in order to improve the following deficits and impairments:  Pain, Decreased mobility, Decreased strength, Increased edema, Decreased balance, Impaired flexibility, Decreased activity tolerance, Difficulty walking  Visit Diagnosis: Difficulty in walking, not elsewhere classified  Muscle weakness (generalized)  Localized edema  Pain in left hip     Problem List Patient Active Problem List   Diagnosis Date Noted   Status post total replacement of left hip 08/21/2020   Vitamin D deficiency 07/13/2020   Pure hypercholesterolemia 07/13/2020   Gastroesophageal reflux disease 07/13/2020   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   Allergic rhinitis due to pollen 07/13/2020   Pain of right heel 07/07/2020   Primary osteoarthritis of left hip 04/25/2020   Hypothyroidism 09/30/2019   Heartburn 09/30/2019   Left hip pain 08/06/2017   Cholelithiases  10/11/2010      [name], PT, DPT 10/11/20 3:05 PM     [facility] [address] [place], [place], [address] Phone: [phone]   Fax:  [phone]  Name: [name] MRN: [id number] Date of Birth: June 28, 1965

## 2020-10-16 ENCOUNTER — Ambulatory Visit (INDEPENDENT_AMBULATORY_CARE_PROVIDER_SITE_OTHER): Payer: No Typology Code available for payment source | Admitting: Physical Therapy

## 2020-10-16 ENCOUNTER — Encounter: Payer: Self-pay | Admitting: Physical Therapy

## 2020-10-16 ENCOUNTER — Other Ambulatory Visit: Payer: Self-pay

## 2020-10-16 DIAGNOSIS — R600 Localized edema: Secondary | ICD-10-CM | POA: Diagnosis not present

## 2020-10-16 DIAGNOSIS — R262 Difficulty in walking, not elsewhere classified: Secondary | ICD-10-CM | POA: Diagnosis not present

## 2020-10-16 DIAGNOSIS — M6281 Muscle weakness (generalized): Secondary | ICD-10-CM | POA: Diagnosis not present

## 2020-10-16 DIAGNOSIS — M25552 Pain in left hip: Secondary | ICD-10-CM

## 2020-10-16 NOTE — Therapy (Signed)
[name], [place], [address] Phone: [phone]   Fax:  [phone]  Physical Therapy Treatment Discharge   Patient Details  Name: [name] MRN: [id number] Date of Birth: 1966-02-12 Referring Provider (PT): Frankey Shown MD   Encounter Date: 10/16/2020   PT End of Session - 10/16/20 1441     Visit Number 12    Number of Visits 16    Date for PT Re-Evaluation 10/27/20    Progress Note Due on Visit 20    PT Start Time 6546    PT Stop Time 1512    PT Time Calculation (min) 39 min    Activity Tolerance Patient tolerated treatment well;No increased pain    Behavior During Therapy WFL for tasks assessed/performed             Past Medical History:  Diagnosis Date   Generalized headaches    GERD (gastroesophageal reflux disease)    Hypothyroidism    Thyroid disease    hypo    Past Surgical History:  Procedure Laterality Date   ABDOMINAL HYSTERECTOMY N/A    Phreesia 08/30/2019   APPENDECTOMY  2003   CESAREAN SECTION  2005   CESAREAN SECTION N/A    Phreesia 08/30/2019   CHOLECYSTECTOMY     DIAGNOSTIC LAPAROSCOPY     Lap chole.   ROBOTIC ASSISTED TOTAL HYSTERECTOMY Bilateral 09/29/2012   Procedure: ROBOTIC ASSISTED TOTAL HYSTERECTOMY WITH BILATERAL SALPINGECTOMY;  Surgeon: Marvene Staff, MD;  Location: [facility];  Service: Gynecology;  Laterality: Bilateral;  3 hrs.   TONSILLECTOMY     TONSILLECTOMY  1969   TOTAL HIP ARTHROPLASTY Left 08/21/2020   Procedure: LEFT TOTAL HIP ARTHROPLASTY ANTERIOR APPROACH;  Surgeon: Leandrew Koyanagi, MD;  Location: [facility];  Service: Orthopedics;  Laterality: Left;  3-C    There were no vitals filed for this visit.   Subjective Assessment - 10/16/20 1440     Subjective Pt stating she has been walking around [employer] and reporting no pain. Pt also reporting walking the dog more.    Pertinent History s/p THR anterior approach Frankey Shown MD    Patient Stated Goals Wants to be  able to drive by July 25 for daughter's camp.    Currently in Pain? No/denies                               [facility] Adult PT Treatment/Exercise - 10/16/20 0001       Knee/Hip Exercises: Aerobic   Nustep L6 x 8 minutes      Knee/Hip Exercises: Standing   Functional Squat 2 sets;10 reps    Functional Squat Limitations on BOSU ball dome up    SLS SLS on BOSU ball dome up 2 x 30 seconds each LE with intermittent UE support    Other Standing Knee Exercises SL deadlift with 15# KB x10 bil, lifitng 15# from floor to counter height x 10                    PT Education - 10/16/20 1516     Education Details updated HEP, discussed ergonomics for home office set up    [employer]) Educated Patient    Methods Explanation;Demonstration;Handout    Comprehension Verbalized understanding;Returned demonstration              PT Short Term Goals - 10/16/20 1441       PT SHORT TERM GOAL #  1   Title Pt will be indepenent in her initial HEP    Status Achieved      PT SHORT TERM GOAL #2   Title Pt will be able to perform 5 time sit to stand with no UE support in </= 14 seconds    Status Achieved               PT Long Term Goals - 10/16/20 1442       PT LONG TERM GOAL #1   Title Pt will be independent in advanced HEP.    Status Achieved      PT LONG TERM GOAL #2   Title Pt will be able to perform functional squat to pick up 15 pounds from floor to counter height with pain </= 2/10.    Status Achieved      PT LONG TERM GOAL #3   Title Pt will be able to amb community surfaces with no device with pain </= 2/10 for at least 20 minutes.    Baseline pt reporting walking her dog 30-40 minutes at a time with pain </= 3/10.    Status Achieved      PT LONG TERM GOAL #4   Title Pt will improve FOTO score from 47% to 67%.    Baseline 67% on 10/09/2020    Status Achieved                   Plan - 10/16/20 1453     Clinical Impression Statement Pt  has met all of her STG's and LTG's set at her initial evaluation. Pt discharged with edu on erogonomics and updated HEP.    Examination-Activity Limitations Lift;Transfers;Other;Squat;Stairs;Stand    Examination-Participation Restrictions Other;Yard Work;Occupation;Cleaning    Stability/Clinical Decision Making Stable/Uncomplicated    Rehab Potential Excellent    PT Treatment/Interventions ADLs/Self Care Home Management;Cryotherapy;Electrical Stimulation;Ultrasound;Gait training;Stair training;Moist Heat;Functional mobility training;Therapeutic activities;Therapeutic exercise;Balance training;Neuromuscular re-education;Patient/family education;Manual techniques;Passive range of motion;Taping    PT Next Visit Plan discharge pt    PT Home Exercise Plan Access Code: [email or web address]    Consulted and Agree with Plan of Care Patient             Patient will benefit from skilled therapeutic intervention in order to improve the following deficits and impairments:  Pain, Decreased mobility, Decreased strength, Increased edema, Decreased balance, Impaired flexibility, Decreased activity tolerance, Difficulty walking  Visit Diagnosis: Difficulty in walking, not elsewhere classified  Muscle weakness (generalized)  Localized edema  Pain in left hip  PHYSICAL THERAPY DISCHARGE SUMMARY  Visits from Start of Care: 10/16/2020   Current functional level related to goals / functional outcomes: See above   Remaining deficits: See above    Education / Equipment: HEP, ergonomics   Patient agrees to discharge. Patient goals were met. Patient is being discharged due to meeting the stated rehab goals.    Problem List Patient Active Problem List   Diagnosis Date Noted   Status post total replacement of left hip 08/21/2020   Vitamin D deficiency 07/13/2020   Pure hypercholesterolemia 07/13/2020   Gastroesophageal reflux disease 07/13/2020   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   Allergic  rhinitis due to pollen 07/13/2020   Pain of right heel 07/07/2020   Primary osteoarthritis of left hip 04/25/2020   Hypothyroidism 09/30/2019   Heartburn 09/30/2019   Left hip pain 08/06/2017   Cholelithiases 10/11/2010    Oretha Caprice, PT, MPT 10/16/2020, 3:17 PM  [facility] [address]  [place], [place], [address] Phone: [phone]   Fax:  [phone]  Name: [name] MRN: [id number] Date of Birth: Sep 26, 1965

## 2020-10-16 NOTE — Patient Instructions (Signed)
Access Code: [email or web address] URL: [email or web address] Date: 10/16/2020 Prepared by: Kearney Hard  Exercises Hooklying Hamstring Stretch with Strap - 2-3 x daily - 7 x weekly - 3 sets - 3 reps - 30 seconds hold Supine Hip Adduction Isometric with Ball - 2-3 x daily - 7 x weekly - 2 sets - 10 reps - 5 seconds hold Standing March with Counter Support - 2-3 x daily - 7 x weekly - 2 sets - 10 reps Heel Toe Raises with Unilateral Counter Support - 2-3 x daily - 7 x weekly - 2 sets - 10 reps Sit to Stand - 2-3 x daily - 7 x weekly - 2 sets - 10 reps Supine Bridge - 2-3 x daily - 7 x weekly - 2 sets - 10 reps - 5 seconds hold Hooklying Clamshell with Resistance - 2-3 x daily - 7 x weekly - 2 sets - 10 reps - 5 seconds hold Tandem Stance - 1-2 x daily - 7 x weekly - 1 sets - 5 reps - 20 second hold Standing Hip Hiking - 2 x daily - 7 x weekly - 2 sets - 10 reps - 3 seconds hold Standing Lumbar Extension at Wall - Forearms - 2 x daily - 7 x weekly - 1 sets - 5 reps - 3 seconds hold Standing Hip Flexor Stretch - 2 x daily - 7 x weekly - 2-3 reps - 20 seconds hold  Patient Education Office Posture

## 2020-10-18 ENCOUNTER — Encounter: Payer: No Typology Code available for payment source | Admitting: Physical Therapy

## 2020-11-14 ENCOUNTER — Encounter: Payer: Self-pay | Admitting: Physician Assistant

## 2020-11-21 ENCOUNTER — Other Ambulatory Visit: Payer: Self-pay

## 2020-11-21 ENCOUNTER — Encounter: Payer: Self-pay | Admitting: Physician Assistant

## 2020-11-21 ENCOUNTER — Ambulatory Visit (INDEPENDENT_AMBULATORY_CARE_PROVIDER_SITE_OTHER): Payer: No Typology Code available for payment source | Admitting: Physician Assistant

## 2020-11-21 VITALS — BP 129/77 | HR 74 | Temp 98.7°F | Ht 63.0 in | Wt 172.6 lb

## 2020-11-21 DIAGNOSIS — E876 Hypokalemia: Secondary | ICD-10-CM | POA: Diagnosis not present

## 2020-11-21 DIAGNOSIS — R7989 Other specified abnormal findings of blood chemistry: Secondary | ICD-10-CM

## 2020-11-21 NOTE — Progress Notes (Signed)
Established Patient Office Visit  Subjective:  Patient ID: [name], female    DOB: 02/21/1966  Age: 55 y.o. MRN: [id number]  CC:  Chief Complaint  Patient presents with   Lab Review    HPI Pamala Hayman presents for discussion of lab results from her pre-op workup. Does reports some fatigue which has improved after being more active and exercising more. Denies dark or bloody stools, abdominal pain, easy bleeding or bruising, chest pain, palpitations, dizziness or shortness of breath. No prior hx of anemia. Does report family hx of MDS (mother).  Past Medical History:  Diagnosis Date   Generalized headaches    GERD (gastroesophageal reflux disease)    Hypothyroidism    Thyroid disease    hypo    Past Surgical History:  Procedure Laterality Date   ABDOMINAL HYSTERECTOMY N/A    Phreesia 08/30/2019   APPENDECTOMY  2003   CESAREAN SECTION  2005   CESAREAN SECTION N/A    Phreesia 08/30/2019   CHOLECYSTECTOMY     DIAGNOSTIC LAPAROSCOPY     Lap chole.   ROBOTIC ASSISTED TOTAL HYSTERECTOMY Bilateral 09/29/2012   Procedure: ROBOTIC ASSISTED TOTAL HYSTERECTOMY WITH BILATERAL SALPINGECTOMY;  Surgeon: Marvene Staff, MD;  Location: [facility];  Service: Gynecology;  Laterality: Bilateral;  3 hrs.   TONSILLECTOMY     TONSILLECTOMY  1969   TOTAL HIP ARTHROPLASTY Left 08/21/2020   Procedure: LEFT TOTAL HIP ARTHROPLASTY ANTERIOR APPROACH;  Surgeon: Leandrew Koyanagi, MD;  Location: [facility];  Service: Orthopedics;  Laterality: Left;  3-C    Family History  Problem Relation Age of Onset   Stroke Father    Transient ischemic attack Father    Heart disease Mother    COPD Mother    Cancer Mother    Breast cancer Mother    Heart attack Mother    Diabetes Mother    Breast cancer Maternal Aunt    Breast cancer Maternal Grandmother    Diabetes Paternal Grandmother     Social History   Socioeconomic History   Marital status: Married    Spouse name: Jewel Baize   Number of children: 1    Years of education: Not on file   Highest education level: Bachelor's degree (e.g., BA, AB, BS)  Occupational History   Occupation: document Games developer: [employer]  Tobacco Use   Smoking status: Former    Packs/day: 0.25    Types: Cigarettes    Quit date: 03/11/2013    Years since quitting: 7.7   Smokeless tobacco: Never  Vaping Use   Vaping Use: Never used  Substance and Sexual Activity   Alcohol use: Yes    Comment: rare   Drug use: No   Sexual activity: Yes    Birth control/protection: None  Other Topics Concern   Not on file  Social History Narrative   Not on file   Social Determinants of Health   Financial Resource Strain: Not on file  Food Insecurity: Not on file  Transportation Needs: Not on file  Physical Activity: Not on file  Stress: Not on file  Social Connections: Not on file  Intimate Partner Violence: Not on file    Outpatient Medications Prior to Visit  Medication Sig Dispense Refill   amoxicillin (AMOXIL) 500 MG capsule Take 4 pills one hour prior to dental work 8 capsule 2   atorvastatin (LIPITOR) 10 MG tablet TAKE 1 TABLET BY MOUTH EVERY DAY 90 tablet 0   Cholecalciferol (VITAMIN  D3) 3000 UNITS TABS Take 3,000 Units by mouth daily.     Coenzyme Q10 200 MG capsule Take 200 mg by mouth daily.     Multiple Vitamin (MULTIVITAMIN PO) Take 1 tablet by mouth daily.     omeprazole (PRILOSEC) 20 MG capsule Take 20 mg by mouth daily as needed (acid reflux).     SYNTHROID 75 MCG tablet Take 1 tablet (75 mcg total) by mouth daily. 90 tablet 1   aspirin EC 81 MG tablet Take 1 tablet (81 mg total) by mouth 2 (two) times daily. To be taken after surgery 84 tablet 0   docusate sodium (COLACE) 100 MG capsule Take 1 capsule (100 mg total) by mouth daily as needed. 30 capsule 2   meloxicam (MOBIC) 7.5 MG tablet Take 1 tablet (7.5 mg total) by mouth daily. 30 tablet 2   methocarbamol (ROBAXIN) 500 MG tablet Take 1 tablet (500 mg total) by mouth 2 (two)  times daily as needed. To be taken after surgery 20 tablet 0   nitrofurantoin, macrocrystal-monohydrate, (MACROBID) 100 MG capsule Take 100 mg by mouth 2 (two) times daily. (Patient not taking: Reported on 08/08/2020)     ondansetron (ZOFRAN) 4 MG tablet Take 1 tablet (4 mg total) by mouth every 8 (eight) hours as needed for nausea or vomiting. 40 tablet 0   oxyCODONE-acetaminophen (PERCOCET) 5-325 MG tablet Take 1-2 tablets by mouth daily as needed. To be taken after surgery 20 tablet 0   traMADol (ULTRAM) 50 MG tablet Take 1-2 tablets (50-100 mg total) by mouth 3 (three) times daily as needed. 30 tablet 0   No facility-administered medications prior to visit.    Allergies  Allergen Reactions   Succinylcholine Rash    ROS Review of Systems Review of Systems:  A fourteen system review of systems was performed and found to be positive as per HPI.   Objective:    Physical Exam General:  Well Developed, well nourished, appropriate for stated age.  Neuro:  Alert and oriented,  extra-ocular muscles intact  HEENT:  Normocephalic, atraumatic, neck supple  Skin:  no gross rash, warm, pink. Cardiac:  RRR, S1 S2 Respiratory:  CTA B/L, Not using accessory muscles, speaking in full sentences- unlabored. Vascular:  Ext warm, no cyanosis apprec.; cap RF less 2 sec. No edema  Psych:  No HI/SI, judgement and insight good, Euthymic mood. Full Affect.  BP 129/77   Pulse 74   Temp 98.7 F (37.1 C)   Ht '5\' 3"'  (1.6 m)   Wt 172 lb 9.6 oz (78.3 kg)   SpO2 99%   BMI 30.57 kg/m  Wt Readings from Last 3 Encounters:  11/21/20 172 lb 9.6 oz (78.3 kg)  08/21/20 175 lb (79.4 kg)  08/15/20 175 lb 9.6 oz (79.7 kg)     Health Maintenance Due  Topic Date Due   URINE MICROALBUMIN  Never done   Hepatitis C Screening  Never done   PAP SMEAR-Modifier  Never done   MAMMOGRAM  Never done   COVID-19 Vaccine (4 - Booster for Pfizer series) 06/12/2020   INFLUENZA VACCINE  10/09/2020    There are no  preventive care reminders to display for this patient.  Lab Results  Component Value Date   TSH 1.600 12/28/2019   Lab Results  Component Value Date   WBC 11.0 (H) 08/22/2020   HGB 11.4 (L) 08/22/2020   HCT 32.9 (L) 08/22/2020   MCV 88.2 08/22/2020   PLT 139 (L) 08/22/2020  Lab Results  Component Value Date   NA 134 (L) 08/22/2020   K 3.4 (L) 08/22/2020   CO2 27 08/22/2020   GLUCOSE 128 (H) 08/22/2020   BUN 12 08/22/2020   CREATININE 0.81 08/22/2020   BILITOT 1.2 08/15/2020   ALKPHOS 93 08/15/2020   AST 20 08/15/2020   ALT 24 08/15/2020   PROT 6.9 08/15/2020   ALBUMIN 4.3 08/15/2020   CALCIUM 8.7 (L) 08/22/2020   ANIONGAP 6 08/22/2020   Lab Results  Component Value Date   CHOL 155 02/15/2020   Lab Results  Component Value Date   HDL 43 02/15/2020   Lab Results  Component Value Date   LDLCALC 85 02/15/2020   Lab Results  Component Value Date   TRIG 155 (H) 02/15/2020   Lab Results  Component Value Date   CHOLHDL 3.6 02/15/2020   Lab Results  Component Value Date   HGBA1C 5.3 12/28/2019      Assessment & Plan:   Problem List Items Addressed This Visit   None Visit Diagnoses     Abnormal CBC    -  Primary   Relevant Orders   CBC w/Diff   Hypokalemia       Relevant Orders   Comp Met (CMET)   Hypocalcemia       Relevant Orders   Comp Met (CMET)      Abnormal CBC, Hypokalemia, Hypocalcemia: -Reviewed and discussed with patient 08/22/2020 labs, CBC abnormalities consistent with normocytic anemia, sodium, calcium and potassium mildly low. Patient is asymptomatic.   -Will repeat CBC and CMP.   No orders of the defined types were placed in this encounter.   Follow-up: Return for as scheduled .    Lorrene Reid, PA-C

## 2020-11-21 NOTE — Patient Instructions (Signed)
Anemia Anemia is a condition in which there is not enough red blood cells or hemoglobin in the blood. Hemoglobin is a substance in red blood cells that carries oxygen. When you do not have enough red blood cells or hemoglobin (are anemic), your body cannot get enough oxygen and your organs may not work properly. As a result, you may feel very tired or have other problems. What are the causes? Common causes of anemia include: Excessive bleeding. Anemia can be caused by excessive bleeding inside or outside the body, including bleeding from the intestines or from heavy menstrual periods in females. Poor nutrition. Long-lasting (chronic) kidney, thyroid, and liver disease. Bone marrow disorders, spleen problems, and blood disorders. Cancer and treatments for cancer. HIV (human immunodeficiency virus) and AIDS (acquired immunodeficiency syndrome). Infections, medicines, and autoimmune disorders that destroy red blood cells. What are the signs or symptoms? Symptoms of this condition include: Minor weakness. Dizziness. Headache, or difficulties concentrating and sleeping. Heartbeats that feel irregular or faster than normal (palpitations). Shortness of breath, especially with exercise. Pale skin, lips, and nails, or cold hands and feet. Indigestion and nausea. Symptoms may occur suddenly or develop slowly. If your anemia is mild, you may not have symptoms. How is this diagnosed? This condition is diagnosed based on blood tests, your medical history, and a physical exam. In some cases, a test may be needed in which cells are removed from the soft tissue inside of a bone and looked at under a microscope (bone marrow biopsy). Your health care provider may also check your stool (feces) for blood and may do additional testing to look for the cause of your bleeding. Other tests may include: Imaging tests, such as a CT scan or MRI. A procedure to see inside your esophagus and stomach (endoscopy). A  procedure to see inside your colon and rectum (colonoscopy). How is this treated? Treatment for this condition depends on the cause. If you continue to lose a lot of blood, you may need to be treated at a hospital. Treatment may include: Taking supplements of iron, vitamin B12, or folic acid. Taking a hormone medicine (erythropoietin) that can help to stimulate red blood cell growth. Having a blood transfusion. This may be needed if you lose a lot of blood. Making changes to your diet. Having surgery to remove your spleen. Follow these instructions at home: Take over-the-counter and prescription medicines only as told by your health care provider. Take supplements only as told by your health care provider. Follow any diet instructions that you were given by your health care provider. Keep all follow-up visits as told by your health care provider. This is important. Contact a health care provider if: You develop new bleeding anywhere in the body. Get help right away if: You are very weak. You are short of breath. You have pain in your abdomen or chest. You are dizzy or feel faint. You have trouble concentrating. You have bloody stools, black stools, or tarry stools. You vomit repeatedly or you vomit up blood. These symptoms may represent a serious problem that is an emergency. Do not wait to see if the symptoms will go away. Get medical help right away. Call your local emergency services (911 in the [place] Do not drive yourself to the hospital. Summary Anemia is a condition in which you do not have enough red blood cells or enough of a substance in your red blood cells that carries oxygen (hemoglobin). Symptoms may occur suddenly or develop slowly. If your anemia is   mild, you may not have symptoms. This condition is diagnosed with blood tests, a medical history, and a physical exam. Other tests may be needed. Treatment for this condition depends on the cause of the anemia. This  information is not intended to replace advice given to you by your health care provider. Make sure you discuss any questions you have with your health care provider. Document Revised: 02/02/2019 Document Reviewed: 02/02/2019 Elsevier Patient Education  2022 Elsevier Inc.  

## 2020-11-22 ENCOUNTER — Encounter: Payer: Self-pay | Admitting: Physician Assistant

## 2020-11-22 LAB — COMPREHENSIVE METABOLIC PANEL
ALT: 22 IU/L (ref 0–32)
AST: 19 IU/L (ref 0–40)
Albumin/Globulin Ratio: 2.6 — ABNORMAL HIGH (ref 1.2–2.2)
Albumin: 4.9 g/dL (ref 3.8–4.9)
Alkaline Phosphatase: 125 IU/L — ABNORMAL HIGH (ref 44–121)
BUN/Creatinine Ratio: 23 (ref 9–23)
BUN: 17 mg/dL (ref 6–24)
Bilirubin Total: 1.1 mg/dL (ref 0.0–1.2)
CO2: 24 mmol/L (ref 20–29)
Calcium: 9.6 mg/dL (ref 8.7–10.2)
Chloride: 104 mmol/L (ref 96–106)
Creatinine, Ser: 0.75 mg/dL (ref 0.57–1.00)
Globulin, Total: 1.9 g/dL (ref 1.5–4.5)
Glucose: 105 mg/dL — ABNORMAL HIGH (ref 65–99)
Potassium: 4.2 mmol/L (ref 3.5–5.2)
Sodium: 142 mmol/L (ref 134–144)
Total Protein: 6.8 g/dL (ref 6.0–8.5)
eGFR: 93 mL/min/{1.73_m2} (ref >59)

## 2020-11-22 LAB — CBC WITH DIFFERENTIAL/PLATELET
Basophils Absolute: 0 10*3/uL (ref 0.0–0.2)
Basos: 1 %
EOS (ABSOLUTE): 0.1 10*3/uL (ref 0.0–0.4)
Eos: 2 %
Hematocrit: 40.7 % (ref 34.0–46.6)
Hemoglobin: 14 g/dL (ref 11.1–15.9)
Immature Grans (Abs): 0 10*3/uL (ref 0.0–0.1)
Immature Granulocytes: 0 %
Lymphocytes Absolute: 1.9 10*3/uL (ref 0.7–3.1)
Lymphs: 29 %
MCH: 30.1 pg (ref 26.6–33.0)
MCHC: 34.4 g/dL (ref 31.5–35.7)
MCV: 88 fL (ref 79–97)
Monocytes Absolute: 0.4 10*3/uL (ref 0.1–0.9)
Monocytes: 7 %
Neutrophils Absolute: 4.1 10*3/uL (ref 1.4–7.0)
Neutrophils: 61 %
Platelets: 177 10*3/uL (ref 150–450)
RBC: 4.65 x10E6/uL (ref 3.77–5.28)
RDW: 13 % (ref 11.7–15.4)
WBC: 6.6 10*3/uL (ref 3.4–10.8)

## 2020-11-28 ENCOUNTER — Other Ambulatory Visit: Payer: Self-pay | Admitting: Physician Assistant

## 2020-11-28 DIAGNOSIS — E785 Hyperlipidemia, unspecified: Secondary | ICD-10-CM

## 2020-11-28 DIAGNOSIS — E1169 Type 2 diabetes mellitus with other specified complication: Secondary | ICD-10-CM

## 2021-01-10 ENCOUNTER — Other Ambulatory Visit: Payer: Self-pay

## 2021-01-10 DIAGNOSIS — E782 Mixed hyperlipidemia: Secondary | ICD-10-CM

## 2021-01-10 DIAGNOSIS — Z13228 Encounter for screening for other metabolic disorders: Secondary | ICD-10-CM

## 2021-01-10 DIAGNOSIS — Z130 Encounter for screening for diseases of the blood and blood-forming organs and certain disorders involving the immune mechanism: Secondary | ICD-10-CM

## 2021-01-10 DIAGNOSIS — Z0000 Encounter for general adult medical examination without abnormal findings: Secondary | ICD-10-CM

## 2021-01-10 DIAGNOSIS — E039 Hypothyroidism, unspecified: Secondary | ICD-10-CM

## 2021-01-12 ENCOUNTER — Other Ambulatory Visit: Payer: No Typology Code available for payment source

## 2021-01-12 ENCOUNTER — Other Ambulatory Visit: Payer: Self-pay

## 2021-01-12 DIAGNOSIS — Z1321 Encounter for screening for nutritional disorder: Secondary | ICD-10-CM

## 2021-01-12 DIAGNOSIS — E782 Mixed hyperlipidemia: Secondary | ICD-10-CM

## 2021-01-12 DIAGNOSIS — E039 Hypothyroidism, unspecified: Secondary | ICD-10-CM

## 2021-01-12 DIAGNOSIS — Z0000 Encounter for general adult medical examination without abnormal findings: Secondary | ICD-10-CM

## 2021-01-12 DIAGNOSIS — Z130 Encounter for screening for diseases of the blood and blood-forming organs and certain disorders involving the immune mechanism: Secondary | ICD-10-CM

## 2021-01-13 LAB — CBC WITH DIFFERENTIAL/PLATELET
Basophils Absolute: 0 10*3/uL (ref 0.0–0.2)
Basos: 0 %
EOS (ABSOLUTE): 0.1 10*3/uL (ref 0.0–0.4)
Eos: 3 %
Hematocrit: 41.7 % (ref 34.0–46.6)
Hemoglobin: 13.9 g/dL (ref 11.1–15.9)
Immature Grans (Abs): 0 10*3/uL (ref 0.0–0.1)
Immature Granulocytes: 0 %
Lymphocytes Absolute: 1.6 10*3/uL (ref 0.7–3.1)
Lymphs: 31 %
MCH: 29.6 pg (ref 26.6–33.0)
MCHC: 33.3 g/dL (ref 31.5–35.7)
MCV: 89 fL (ref 79–97)
Monocytes Absolute: 0.4 10*3/uL (ref 0.1–0.9)
Monocytes: 8 %
Neutrophils Absolute: 3.1 10*3/uL (ref 1.4–7.0)
Neutrophils: 58 %
Platelets: 174 10*3/uL (ref 150–450)
RBC: 4.69 x10E6/uL (ref 3.77–5.28)
RDW: 13.2 % (ref 11.7–15.4)
WBC: 5.2 10*3/uL (ref 3.4–10.8)

## 2021-01-13 LAB — LIPID PANEL
Chol/HDL Ratio: 3.3 ratio (ref 0.0–4.4)
Cholesterol, Total: 136 mg/dL (ref 100–199)
HDL: 41 mg/dL (ref >39)
LDL Chol Calc (NIH): 74 mg/dL (ref 0–99)
Triglycerides: 118 mg/dL (ref 0–149)
VLDL Cholesterol Cal: 21 mg/dL (ref 5–40)

## 2021-01-13 LAB — COMPREHENSIVE METABOLIC PANEL
ALT: 110 IU/L — ABNORMAL HIGH (ref 0–32)
AST: 32 IU/L (ref 0–40)
Albumin/Globulin Ratio: 2.6 — ABNORMAL HIGH (ref 1.2–2.2)
Albumin: 4.7 g/dL (ref 3.8–4.9)
Alkaline Phosphatase: 153 IU/L — ABNORMAL HIGH (ref 44–121)
BUN/Creatinine Ratio: 18 (ref 9–23)
BUN: 12 mg/dL (ref 6–24)
Bilirubin Total: 1.1 mg/dL (ref 0.0–1.2)
CO2: 25 mmol/L (ref 20–29)
Calcium: 9.7 mg/dL (ref 8.7–10.2)
Chloride: 103 mmol/L (ref 96–106)
Creatinine, Ser: 0.67 mg/dL (ref 0.57–1.00)
Globulin, Total: 1.8 g/dL (ref 1.5–4.5)
Glucose: 99 mg/dL (ref 70–99)
Potassium: 4.1 mmol/L (ref 3.5–5.2)
Sodium: 141 mmol/L (ref 134–144)
Total Protein: 6.5 g/dL (ref 6.0–8.5)
eGFR: 103 mL/min/{1.73_m2} (ref >59)

## 2021-01-13 LAB — HEMOGLOBIN A1C
Est. average glucose Bld gHb Est-mCnc: 117 mg/dL
Hgb A1c MFr Bld: 5.7 % — ABNORMAL HIGH (ref 4.8–5.6)

## 2021-01-13 LAB — TSH: TSH: 1.26 u[IU]/mL (ref 0.450–4.500)

## 2021-01-15 ENCOUNTER — Ambulatory Visit (INDEPENDENT_AMBULATORY_CARE_PROVIDER_SITE_OTHER): Payer: No Typology Code available for payment source | Admitting: Physician Assistant

## 2021-01-15 ENCOUNTER — Other Ambulatory Visit: Payer: Self-pay

## 2021-01-15 ENCOUNTER — Encounter: Payer: Self-pay | Admitting: Physician Assistant

## 2021-01-15 VITALS — BP 120/81 | HR 66 | Temp 98.2°F | Ht 63.0 in | Wt 173.0 lb

## 2021-01-15 DIAGNOSIS — Z23 Encounter for immunization: Secondary | ICD-10-CM

## 2021-01-15 DIAGNOSIS — Z0000 Encounter for general adult medical examination without abnormal findings: Secondary | ICD-10-CM

## 2021-01-15 DIAGNOSIS — Z1159 Encounter for screening for other viral diseases: Secondary | ICD-10-CM | POA: Diagnosis not present

## 2021-01-15 DIAGNOSIS — R748 Abnormal levels of other serum enzymes: Secondary | ICD-10-CM

## 2021-01-15 NOTE — Progress Notes (Addendum)
Subjective:     [name] is a 55 y.o. female and is here for a comprehensive physical exam. The patient reports no problems.  Social History   Socioeconomic History   Marital status: Married    Spouse name: Jewel Baize   Number of children: 1   Years of education: Not on file   Highest education level: Bachelor's degree (e.g., BA, AB, BS)  Occupational History   Occupation: document Games developer: [employer]  Tobacco Use   Smoking status: Former    Packs/day: 0.25    Types: Cigarettes    Quit date: 03/11/2013    Years since quitting: 7.8   Smokeless tobacco: Never  Vaping Use   Vaping Use: Never used  Substance and Sexual Activity   Alcohol use: Yes    Comment: rare   Drug use: No   Sexual activity: Yes    Birth control/protection: None  Other Topics Concern   Not on file  Social History Narrative   Not on file   Social Determinants of Health   Financial Resource Strain: Not on file  Food Insecurity: Not on file  Transportation Needs: Not on file  Physical Activity: Not on file  Stress: Not on file  Social Connections: Not on file  Intimate Partner Violence: Not on file   Health Maintenance  Topic Date Due   Pneumococcal Vaccine 65-53 Years old (1 - PCV) Never done   URINE MICROALBUMIN  Never done   Hepatitis C Screening  Never done   PAP SMEAR-Modifier  Never done   MAMMOGRAM  Never done   COVID-19 Vaccine (4 - Booster for Tok series) 04/08/2020   TETANUS/TDAP  07/29/2021   COLONOSCOPY (Pts 45-8yrs Insurance coverage will need to be confirmed)  10/25/2026   INFLUENZA VACCINE  Completed   HIV Screening  Completed   Zoster Vaccines- Shingrix  Completed   HPV VACCINES  Aged Out    The following portions of the patient's history were reviewed and updated as appropriate: allergies, current medications, past family history, past medical history, past social history, past surgical history, and problem list.  Review of Systems Pertinent  items noted in HPI and remainder of comprehensive ROS otherwise negative.   Objective:    BP 120/81   Pulse 66   Temp 98.2 F (36.8 C)   Ht 5\' 3"  (1.6 m)   Wt 173 lb (78.5 kg)   SpO2 99%   BMI 30.65 kg/m  General appearance: alert, cooperative, and no distress Head: Normocephalic, without obvious abnormality, atraumatic Eyes: conjunctivae/corneas clear. PERRL, EOM's intact. Fundi benign. Ears: normal TM's and external ear canals both ears Nose: Nares normal. Septum midline. Mucosa normal. No drainage or sinus tenderness. Throat: lips, mucosa, and tongue normal; teeth and gums normal Neck: no adenopathy, no carotid bruit, no JVD, supple, symmetrical, trachea midline, and thyroid: normal to inspection and palpation Back: symmetric, no curvature. ROM normal. No CVA tenderness. Lungs: clear to auscultation bilaterally Heart: regular rate and rhythm and S1, S2 normal Abdomen: soft, non-tender; bowel sounds normal; no masses,  no organomegaly Pelvic:  Deferred to Ob/Gyn Extremities: extremities normal, atraumatic, no cyanosis or edema Pulses: 2+ and symmetric Skin: Skin color, texture, turgor normal. No rashes or lesions Lymph nodes: Cervical adenopathy: normal and Supraclavicular adenopathy: normal Neurologic: Grossly normal    Assessment:    Healthy female exam.     Plan:  -Discussed most recent lab results which are essentially within normal limits or stable from prior  with the exception of A1c and hepatic function. Discussed with patient reducing and limiting Tylenol use, will repeat hepatic function in 2 weeks. Patient agreeable to Hep C screening.  -Followed by OB/GYN for female exam. Scheduled for mammogram with [facility] next month. UTD colonoscopy. Agreeable to influenza vaccine. -Discussed low fat and carbohydrate diet. Encourage to continue weight loss efforts with dietary changes. -Recommend to continue to stay as active as possible with ultimate of moderate physical activity  150 mins/wk. -Follow up in 6 months for thyroid, HLD, prediabetes   See After Visit Summary for Counseling Recommendations

## 2021-01-15 NOTE — Patient Instructions (Signed)
Preventive Care 92-56 Years Old, Female Preventive care refers to lifestyle choices and visits with your health care provider that can promote health and wellness. Preventive care visits are also called wellness exams. What can I expect for my preventive care visit? Counseling Your health care provider may ask you questions about your: Medical history, including: Past medical problems. Family medical history. Pregnancy history. Current health, including: Menstrual cycle. Method of birth control. Emotional well-being. Home life and relationship well-being. Sexual activity and sexual health. Lifestyle, including: Alcohol, nicotine or tobacco, and drug use. Access to firearms. Diet, exercise, and sleep habits. Work and work Statistician. Sunscreen use. Safety issues such as seatbelt and bike helmet use. Physical exam Your health care provider will check your: Height and weight. These may be used to calculate your BMI (body mass index). BMI is a measurement that tells if you are at a healthy weight. Waist circumference. This measures the distance around your waistline. This measurement also tells if you are at a healthy weight and may help predict your risk of certain diseases, such as type 2 diabetes and high blood pressure. Heart rate and blood pressure. Body temperature. Skin for abnormal spots. What immunizations do I need? Vaccines are usually given at various ages, according to a schedule. Your health care provider will recommend vaccines for you based on your age, medical history, and lifestyle or other factors, such as travel or where you work. What tests do I need? Screening Your health care provider may recommend screening tests for certain conditions. This may include: Lipid and cholesterol levels. Diabetes screening. This is done by checking your blood sugar (glucose) after you have not eaten for a while (fasting). Pelvic exam and Pap test. Hepatitis B test. Hepatitis C  test. HIV (human immunodeficiency virus) test. STI (sexually transmitted infection) testing, if you are at risk. Lung cancer screening. Colorectal cancer screening. Mammogram. Talk with your health care provider about when you should start having regular mammograms. This may depend on whether you have a family history of breast cancer. BRCA-related cancer screening. This may be done if you have a family history of breast, ovarian, tubal, or peritoneal cancers. Bone density scan. This is done to screen for osteoporosis. Talk with your health care provider about your test results, treatment options, and if necessary, the need for more tests. Follow these instructions at home: Eating and drinking  Eat a diet that includes fresh fruits and vegetables, whole grains, lean protein, and low-fat dairy products. Take vitamin and mineral supplements as recommended by your health care provider. Do not drink alcohol if: Your health care provider tells you not to drink. You are pregnant, may be pregnant, or are planning to become pregnant. If you drink alcohol: Limit how much you have to 0-1 drink a day. Know how much alcohol is in your drink. In the [place] one drink equals one 12 oz bottle of beer (355 mL), one 5 oz glass of wine (148 mL), or one 1 oz glass of hard liquor (44 mL). Lifestyle Brush your teeth every morning and night with fluoride toothpaste. Floss one time each day. Exercise for at least 30 minutes 5 or more days each week. Do not use any products that contain nicotine or tobacco. These products include cigarettes, chewing tobacco, and vaping devices, such as e-cigarettes. If you need help quitting, ask your health care provider. Do not use drugs. If you are sexually active, practice safe sex. Use a condom or other form of protection to prevent  STIs. If you do not wish to become pregnant, use a form of birth control. If you plan to become pregnant, see your health care provider for a  prepregnancy visit. Take aspirin only as told by your health care provider. Make sure that you understand how much to take and what form to take. Work with your health care provider to find out whether it is safe and beneficial for you to take aspirin daily. Find healthy ways to manage stress, such as: Meditation, yoga, or listening to music. Journaling. Talking to a trusted person. Spending time with friends and family. Minimize exposure to UV radiation to reduce your risk of skin cancer. Safety Always wear your seat belt while driving or riding in a vehicle. Do not drive: If you have been drinking alcohol. Do not ride with someone who has been drinking. When you are tired or distracted. While texting. If you have been using any mind-altering substances or drugs. Wear a helmet and other protective equipment during sports activities. If you have firearms in your house, make sure you follow all gun safety procedures. Seek help if you have been physically or sexually abused. What's next? Visit your health care provider once a year for an annual wellness visit. Ask your health care provider how often you should have your eyes and teeth checked. Stay up to date on all vaccines. This information is not intended to replace advice given to you by your health care provider. Make sure you discuss any questions you have with your health care provider. Document Revised: 08/23/2020 Document Reviewed: 08/23/2020 Elsevier Patient Education  [employer].

## 2021-01-16 LAB — HEPATITIS C ANTIBODY: Hep C Virus Ab: <0.1 s/co ratio (ref 0.0–0.9)

## 2021-01-20 ENCOUNTER — Other Ambulatory Visit: Payer: Self-pay | Admitting: Physician Assistant

## 2021-01-20 DIAGNOSIS — E039 Hypothyroidism, unspecified: Secondary | ICD-10-CM

## 2021-01-25 ENCOUNTER — Other Ambulatory Visit: Payer: Self-pay | Admitting: Physician Assistant

## 2021-01-25 DIAGNOSIS — R748 Abnormal levels of other serum enzymes: Secondary | ICD-10-CM

## 2021-01-25 DIAGNOSIS — Z0000 Encounter for general adult medical examination without abnormal findings: Secondary | ICD-10-CM

## 2021-01-26 ENCOUNTER — Other Ambulatory Visit: Payer: No Typology Code available for payment source

## 2021-01-26 ENCOUNTER — Other Ambulatory Visit: Payer: Self-pay

## 2021-01-26 DIAGNOSIS — Z0000 Encounter for general adult medical examination without abnormal findings: Secondary | ICD-10-CM

## 2021-01-26 DIAGNOSIS — R748 Abnormal levels of other serum enzymes: Secondary | ICD-10-CM

## 2021-01-27 ENCOUNTER — Encounter: Payer: Self-pay | Admitting: Orthopaedic Surgery

## 2021-01-27 LAB — COMPREHENSIVE METABOLIC PANEL
ALT: 20 IU/L (ref 0–32)
AST: 19 IU/L (ref 0–40)
Albumin/Globulin Ratio: 2.6 — ABNORMAL HIGH (ref 1.2–2.2)
Albumin: 4.9 g/dL (ref 3.8–4.9)
Alkaline Phosphatase: 131 IU/L — ABNORMAL HIGH (ref 44–121)
BUN/Creatinine Ratio: 20 (ref 9–23)
BUN: 13 mg/dL (ref 6–24)
Bilirubin Total: 1.4 mg/dL — ABNORMAL HIGH (ref 0.0–1.2)
CO2: 24 mmol/L (ref 20–29)
Calcium: 9.6 mg/dL (ref 8.7–10.2)
Chloride: 106 mmol/L (ref 96–106)
Creatinine, Ser: 0.66 mg/dL (ref 0.57–1.00)
Globulin, Total: 1.9 g/dL (ref 1.5–4.5)
Glucose: 101 mg/dL — ABNORMAL HIGH (ref 70–99)
Potassium: 4.4 mmol/L (ref 3.5–5.2)
Sodium: 145 mmol/L — ABNORMAL HIGH (ref 134–144)
Total Protein: 6.8 g/dL (ref 6.0–8.5)
eGFR: 103 mL/min/{1.73_m2} (ref >59)

## 2021-02-04 ENCOUNTER — Encounter: Payer: Self-pay | Admitting: Physical Therapy

## 2021-02-13 ENCOUNTER — Ambulatory Visit (INDEPENDENT_AMBULATORY_CARE_PROVIDER_SITE_OTHER): Payer: No Typology Code available for payment source | Admitting: Orthopaedic Surgery

## 2021-02-13 ENCOUNTER — Ambulatory Visit (INDEPENDENT_AMBULATORY_CARE_PROVIDER_SITE_OTHER): Payer: No Typology Code available for payment source

## 2021-02-13 ENCOUNTER — Other Ambulatory Visit: Payer: Self-pay

## 2021-02-13 DIAGNOSIS — Z96642 Presence of left artificial hip joint: Secondary | ICD-10-CM | POA: Diagnosis not present

## 2021-02-13 NOTE — Progress Notes (Signed)
Office Visit Note   [name]           Date of Birth: 1965-09-15           MRN: [id number] Visit Date: 02/13/2021              Requested by: Lorrene Reid, PA-C [address],  [place] [address] PCP: Lorrene Reid, PA-C   Assessment & Plan: Visit Diagnoses:  1. Status post total hip replacement, left     Plan: Michela is a 54-month status post left total hip replacement.  She is doing well and has no complaints.  She is very happy with the outcome.  She has done some hiking at [facility] without any problems.  She works from home.  Left hip scar is fully healed.  No signs of infection.  Excellent range of motion and gait and ambulation.  X-rays show unremarkable total hip replacement.  Sophiah has done very well from her surgery.  I recommend that she continue to engage in activity as tolerated.  Dental prophylaxis reinforced.  Recheck in 6 months for 1 year visit with standing AP pelvis x-rays.  Follow-Up Instructions: Return in about 6 months (around 08/14/2021).   Orders:  Orders Placed This Encounter  Procedures   XR HIP UNILAT W OR W/O PELVIS 2-3 VIEWS LEFT   No orders of the defined types were placed in this encounter.     Procedures: No procedures performed   Clinical Data: No additional findings.   Subjective: Chief Complaint  Patient presents with   Left Hip - Follow-up    S/p left hip arthroplasty 08/21/20    HPI  Review of Systems   Objective: Vital Signs: There were no vitals taken for this visit.  Physical Exam  Ortho Exam  Specialty Comments:  No specialty comments available.  Imaging: XR HIP UNILAT W OR W/O PELVIS 2-3 VIEWS LEFT  Result Date: 02/13/2021 Stable total hip replacement without complication    PMFS History: Patient Active Problem List   Diagnosis Date Noted   Status post total replacement of left hip 08/21/2020   Vitamin D deficiency 07/13/2020   Pure hypercholesterolemia 07/13/2020    Gastroesophageal reflux disease 07/13/2020   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   Allergic rhinitis due to pollen 07/13/2020   Pain of right heel 07/07/2020   Primary osteoarthritis of left hip 04/25/2020   Hypothyroidism 09/30/2019   Heartburn 09/30/2019   Left hip pain 08/06/2017   Cholelithiases 10/11/2010   Past Medical History:  Diagnosis Date   Generalized headaches    GERD (gastroesophageal reflux disease)    Hypothyroidism    Thyroid disease    hypo    Family History  Problem Relation Age of Onset   Stroke Father    Transient ischemic attack Father    Heart disease Mother    COPD Mother    Cancer Mother    Breast cancer Mother    Heart attack Mother    Diabetes Mother    Breast cancer Maternal Aunt    Breast cancer Maternal Grandmother    Diabetes Paternal Grandmother     Past Surgical History:  Procedure Laterality Date   ABDOMINAL HYSTERECTOMY N/A    [employer] 08/30/2019   APPENDECTOMY  2003   CESAREAN SECTION  2005   CESAREAN SECTION N/A    [employer]reesia 08/30/2019   CHOLECYSTECTOMY     DIAGNOSTIC LAPAROSCOPY     Lap chole.   ROBOTIC ASSISTED TOTAL  HYSTERECTOMY Bilateral 09/29/2012   Procedure: ROBOTIC ASSISTED TOTAL HYSTERECTOMY WITH BILATERAL SALPINGECTOMY;  Surgeon: Marvene Staff, MD;  Location: [facility];  Service: Gynecology;  Laterality: Bilateral;  3 hrs.   TONSILLECTOMY     TONSILLECTOMY  1969   TOTAL HIP ARTHROPLASTY Left 08/21/2020   Procedure: LEFT TOTAL HIP ARTHROPLASTY ANTERIOR APPROACH;  Surgeon: Leandrew Koyanagi, MD;  Location: [facility];  Service: Orthopedics;  Laterality: Left;  3-C   Social History   Occupational History   Occupation: document Games developer: [employer]  Tobacco Use   Smoking status: Former    Packs/day: 0.25    Types: Cigarettes    Quit date: 03/11/2013    Years since quitting: 7.9   Smokeless tobacco: Never  Vaping Use   Vaping Use: Never used  Substance and Sexual Activity   Alcohol use: Yes     Comment: rare   Drug use: No   Sexual activity: Yes    Birth control/protection: None

## 2021-02-22 LAB — HM MAMMOGRAPHY

## 2021-02-26 ENCOUNTER — Encounter: Payer: Self-pay | Admitting: Physician Assistant

## 2021-03-01 ENCOUNTER — Other Ambulatory Visit: Payer: Self-pay | Admitting: Physician Assistant

## 2021-03-01 DIAGNOSIS — E785 Hyperlipidemia, unspecified: Secondary | ICD-10-CM

## 2021-05-07 ENCOUNTER — Encounter: Payer: Self-pay | Admitting: Orthopaedic Surgery

## 2021-05-07 ENCOUNTER — Other Ambulatory Visit: Payer: Self-pay | Admitting: Physician Assistant

## 2021-05-07 MED ORDER — AMOXICILLIN 500 MG PO CAPS: ORAL_CAPSULE | ORAL | 2 refills | Status: DC

## 2021-05-07 NOTE — Telephone Encounter (Signed)
Yes, we recommend for any type of dental work. I sent in refill

## 2021-05-17 ENCOUNTER — Other Ambulatory Visit: Payer: Self-pay

## 2021-05-17 ENCOUNTER — Emergency Department (HOSPITAL_BASED_OUTPATIENT_CLINIC_OR_DEPARTMENT_OTHER)
Admission: EM | Admit: 2021-05-17 | Discharge: 2021-05-17 | Disposition: A | Discharge status: Home / Self Care | Payer: No Typology Code available for payment source | Attending: Emergency Medicine | Admitting: Emergency Medicine

## 2021-05-17 ENCOUNTER — Ambulatory Visit
Admission: EM | Admit: 2021-05-17 | Discharge: 2021-05-17 | Disposition: A | Discharge status: Home / Self Care | Payer: No Typology Code available for payment source

## 2021-05-17 ENCOUNTER — Encounter (HOSPITAL_BASED_OUTPATIENT_CLINIC_OR_DEPARTMENT_OTHER): Payer: Self-pay

## 2021-05-17 DIAGNOSIS — R109 Unspecified abdominal pain: Secondary | ICD-10-CM | POA: Diagnosis present

## 2021-05-17 DIAGNOSIS — K5732 Diverticulitis of large intestine without perforation or abscess without bleeding: Secondary | ICD-10-CM | POA: Diagnosis not present

## 2021-05-17 DIAGNOSIS — K5792 Diverticulitis of intestine, part unspecified, without perforation or abscess without bleeding: Secondary | ICD-10-CM

## 2021-05-17 LAB — CBC
HCT: 39.6 % (ref 36.0–46.0)
Hemoglobin: 13.7 g/dL (ref 12.0–15.0)
MCH: 30.1 pg (ref 26.0–34.0)
MCHC: 34.6 g/dL (ref 30.0–36.0)
MCV: 87 fL (ref 80.0–100.0)
Platelets: 197 10*3/uL (ref 150–400)
RBC: 4.55 MIL/uL (ref 3.87–5.11)
RDW: 12.3 % (ref 11.5–15.5)
WBC: 11.1 10*3/uL — ABNORMAL HIGH (ref 4.0–10.5)
nRBC: 0 % (ref 0.0–0.2)

## 2021-05-17 LAB — COMPREHENSIVE METABOLIC PANEL
ALT: 12 U/L (ref 0–44)
AST: 12 U/L — ABNORMAL LOW (ref 15–41)
Albumin: 4.6 g/dL (ref 3.5–5.0)
Alkaline Phosphatase: 100 U/L (ref 38–126)
Anion gap: 9 (ref 5–15)
BUN: 12 mg/dL (ref 6–20)
CO2: 27 mmol/L (ref 22–32)
Calcium: 9.6 mg/dL (ref 8.9–10.3)
Chloride: 103 mmol/L (ref 98–111)
Creatinine, Ser: 0.67 mg/dL (ref 0.44–1.00)
GFR, Estimated: >60 mL/min (ref >60)
Glucose, Bld: 101 mg/dL — ABNORMAL HIGH (ref 70–99)
Potassium: 3.6 mmol/L (ref 3.5–5.1)
Sodium: 139 mmol/L (ref 135–145)
Total Bilirubin: 1.8 mg/dL — ABNORMAL HIGH (ref 0.3–1.2)
Total Protein: 7.4 g/dL (ref 6.5–8.1)

## 2021-05-17 LAB — URINALYSIS, ROUTINE W REFLEX MICROSCOPIC
Bilirubin Urine: NEGATIVE
Glucose, UA: NEGATIVE mg/dL
Hgb urine dipstick: NEGATIVE
Ketones, ur: NEGATIVE mg/dL
Leukocytes,Ua: NEGATIVE
Nitrite: NEGATIVE
Protein, ur: NEGATIVE mg/dL
Specific Gravity, Urine: 1.011 (ref 1.005–1.030)
pH: 5 (ref 5.0–8.0)

## 2021-05-17 LAB — LIPASE, BLOOD: Lipase: 16 U/L (ref 11–51)

## 2021-05-17 MED ORDER — METRONIDAZOLE 500 MG PO TABS: 500.0000 mg | ORAL_TABLET | Freq: Three times a day (TID) | ORAL | 0 refills | Status: AC

## 2021-05-17 MED ORDER — METRONIDAZOLE 500 MG PO TABS
500.0000 mg | ORAL_TABLET | Freq: Once | ORAL | Status: AC
  Administered 2021-05-17: 20:00:00 500 mg via ORAL
  Filled 2021-05-17: qty 1

## 2021-05-17 MED ORDER — METRONIDAZOLE 500 MG/100ML IV SOLN
500.0000 mg | Freq: Once | INTRAVENOUS | Status: DC
Start: 2021-05-17 — End: 2021-05-17
  Filled 2021-05-17: qty 100

## 2021-05-17 MED ORDER — CIPROFLOXACIN HCL 500 MG PO TABS
500.0000 mg | ORAL_TABLET | Freq: Once | ORAL | Status: AC
  Administered 2021-05-17: 20:00:00 500 mg via ORAL
  Filled 2021-05-17: qty 1

## 2021-05-17 MED ORDER — CIPROFLOXACIN HCL 500 MG PO TABS: 500.0000 mg | ORAL_TABLET | Freq: Two times a day (BID) | ORAL | 0 refills | Status: AC

## 2021-05-17 NOTE — ED Triage Notes (Signed)
Pt states she feels she is having a diverticulitis flare up.  Pain LLQ, denies N/V ?

## 2021-05-17 NOTE — ED Provider Notes (Signed)
?[facility] ?Provider Note ? ? ?CSN: [id number] ?Arrival date & time: 05/17/21  1752 ? ?  ? ?History ? ?Chief Complaint  ?Patient presents with  ? Abdominal Pain  ? ? ?[name] is a 56 y.o. female presented emerged department of abdominal pain.  The patient has a history of sigmoid diverticulosis and diverticulitis that was most recently treated about 4 years ago.  She says has been dealing with cramping, constipation and bloating for the past several days, which is unusual for her.  This morning she woke up with cramping pain in her left upper and left lower quadrant which she says feels like her old diverticulitis.  She denies nausea or vomiting.  She denies dysuria or hematuria.  She has a history of cholecystectomy, appendectomy, partial hysterectomy. ? ?I reviewed her most recent colonoscopy from August 2018 which noted sigmoid diverticulosis, no other significant findings. ? ?HPI ? ?  ? ?Home Medications ?Prior to Admission medications   ?Medication Sig Start Date End Date Taking? Authorizing Provider  ?ciprofloxacin (CIPRO) 500 MG tablet Take 1 tablet (500 mg total) by mouth every 12 (twelve) hours for 7 days. 05/18/21 05/25/21 Yes Baelynn Schmuhl, Carola Rhine, MD  ?metroNIDAZOLE (FLAGYL) 500 MG tablet Take 1 tablet (500 mg total) by mouth 3 (three) times daily for 7 days. 05/17/21 05/24/21 Yes Teshawn Moan, Carola Rhine, MD  ?amoxicillin (AMOXIL) 500 MG capsule Take 4 pills one hour prior to dental work 05/07/21   Aundra Dubin, PA-C  ?atorvastatin (LIPITOR) 10 MG tablet TAKE 1 TABLET BY MOUTH EVERY DAY 03/01/21   Lorrene Reid, PA-C  ?Cholecalciferol (VITAMIN D3) 3000 UNITS TABS Take 3,000 Units by mouth daily.    [provider]  ?Coenzyme Q10 200 MG capsule Take 400 mg by mouth daily.    [provider]  ?Multiple Vitamin (MULTIVITAMIN PO) Take 1 tablet by mouth daily.    [provider]  ?omeprazole (PRILOSEC) 20 MG capsule Take 20 mg by mouth daily as needed (acid  reflux).    [provider]  ?SYNTHROID 75 MCG tablet TAKE 1 TABLET BY MOUTH EVERY DAY 01/22/21   Lorrene Reid, PA-C  ?   ? ?Allergies    ?Succinylcholine   ? ?Review of Systems   ?Review of Systems ? ?Physical Exam ?Updated Vital Signs ?BP 122/66   Pulse 84   Temp 98.8 ?F (37.1 ?C)   Resp 17   Ht '5\' 3"'$  (1.6 m)   Wt 77.6 kg   SpO2 100%   BMI 30.29 kg/m?  ?Physical Exam ?Constitutional:   ?   General: She is not in acute distress. ?HENT:  ?   Head: Normocephalic and atraumatic.  ?Eyes:  ?   Conjunctiva/sclera: Conjunctivae normal.  ?   Pupils: Pupils are equal, round, and reactive to light.  ?Cardiovascular:  ?   Rate and Rhythm: Normal rate and regular rhythm.  ?Pulmonary:  ?   Effort: Pulmonary effort is normal. No respiratory distress.  ?Abdominal:  ?   General: There is no distension.  ?   Tenderness: There is abdominal tenderness in the left upper quadrant and left lower quadrant. There is no guarding or rebound.  ?Skin: ?   General: Skin is warm and dry.  ?Neurological:  ?   General: No focal deficit present.  ?   Mental Status: She is alert. Mental status is at baseline.  ?Psychiatric:     ?   Mood and Affect: Mood normal.     ?  Behavior: Behavior normal.  ? ? ?ED Results / Procedures / Treatments   ?Labs ?(all labs ordered are listed, but only abnormal results are displayed) ?Labs Reviewed  ?COMPREHENSIVE METABOLIC PANEL - Abnormal; Notable for the following components:  ?    Result Value  ? Glucose, Bld 101 (*)   ? AST 12 (*)   ? Total Bilirubin 1.8 (*)   ? All other components within normal limits  ?CBC - Abnormal; Notable for the following components:  ? WBC 11.1 (*)   ? All other components within normal limits  ?LIPASE, BLOOD  ?URINALYSIS, ROUTINE W REFLEX MICROSCOPIC  ? ? ?EKG ?None ? ?Radiology ?No results found. ? ?Procedures ?Procedures  ? ? ?Medications Ordered in ED ?Medications  ?ciprofloxacin (CIPRO) tablet 500 mg (500 mg Oral Given 05/17/21 1944)  ?metroNIDAZOLE (FLAGYL)  tablet 500 mg (500 mg Oral Given 05/17/21 1944)  ? ? ?ED Course/ Medical Decision Making/ A&P ?Clinical Course as of 05/17/21 2336  ?Thu May 17, 2021  ?1928 Minor leukocytosis noted on blood work.  I think is reasonable with the story and these findings to treat empirically for acute uncomplicated diverticulitis.  Will start on Cipro and Flagyl.  The patient and her husband are in agreement this plan. [MT]  ?  ?Clinical Course User Index ?[MT] Wyvonnia Dusky, MD  ? ?                        ?Medical Decision Making ?Amount and/or Complexity of Data Reviewed ?Labs: ordered. ? ?Risk ?Prescription drug management. ? ? ?This patient presents to the Emergency Department with complaint of abdominal pain. This involves an extensive number of treatment options, and is a complaint that carries with it a high risk of complications and morbidity.  The differential diagnosis includes, but is not limited to, diverticulitis versus colitis versus UTI versus other ? ?I ordered, reviewed, and interpreted labs, including BMP and CBC.  There were no immediate, life-threatening emergencies found in this labwork.  Patient's UA showed no signs of infection ? ?Patient has a reassuring abdominal exam, no peritonitis or guarding, have very low suspicion for GI perforation or abscess.  I do not feel that she needed emergent CT imaging of her belly at this time, but felt that her history and exam was clinically consistent enough for diverticulitis to treat empirically with antibiotics.  She was in agreement. ? ?External records obtained and reviewed showing colonoscopy report from 2018 ? ?After the interventions stated above, I reevaluated the patient and found that they remained clinically stable. ? ?Based on the patient's clinical exam, vital signs, risk factors, and ED testing, I felt that the patient's overall risk of life-threatening emergency such as bowel perforation, surgical emergency, or sepsis was quite low.  I suspect this  clinical presentation is most consistent with acute uncomplicated diverticulitis, but explained to the patient that this evaluation was not a definitive diagnostic workup. ? ?I discussed outpatient follow up with primary care provider, and provided specialist office number on the patient's discharge paper if a referral was deemed necessary.  I discussed return precautions with the patient. I felt the patient was clinically stable for discharge. ? ? ? ? ? ? ? ? ?Final Clinical Impression(s) / ED Diagnoses ?Final diagnoses:  ?Diverticulitis  ? ? ?Rx / DC Orders ?ED Discharge Orders   ? ?      Ordered  ?  ciprofloxacin (CIPRO) 500 MG tablet  Every 12 hours       ?  05/17/21 1928  ?  metroNIDAZOLE (FLAGYL) 500 MG tablet  3 times daily       ? 05/17/21 1928  ? ?  ?  ? ?  ? ? ?  ?Wyvonnia Dusky, MD ?05/17/21 2336 ? ?

## 2021-05-17 NOTE — ED Notes (Signed)
RN provided AVS using Teachback Method. Patient verbalizes understanding of Discharge Instructions. Opportunity for Questioning and Answers were provided by RN. Patient Discharged from ED ambulatory to Home with Significant Other.  

## 2021-05-17 NOTE — Discharge Instructions (Signed)
Patient is being discharged from the Urgent Care and sent to the Emergency Department via car . Per Wells Guiles, patient is in need of higher level of care. Patient is aware and verbalizes understanding of plan of care. There were no vitals filed for this visit.  ?

## 2021-05-21 ENCOUNTER — Other Ambulatory Visit: Payer: Self-pay

## 2021-05-21 ENCOUNTER — Encounter: Payer: Self-pay | Admitting: Physician Assistant

## 2021-05-21 ENCOUNTER — Ambulatory Visit: Payer: No Typology Code available for payment source | Admitting: Physician Assistant

## 2021-05-21 VITALS — BP 117/75 | HR 84 | Temp 97.9°F | Ht 63.0 in | Wt 168.0 lb

## 2021-05-21 DIAGNOSIS — B370 Candidal stomatitis: Secondary | ICD-10-CM

## 2021-05-21 DIAGNOSIS — D485 Neoplasm of uncertain behavior of skin: Secondary | ICD-10-CM | POA: Insufficient documentation

## 2021-05-21 DIAGNOSIS — L814 Other melanin hyperpigmentation: Secondary | ICD-10-CM | POA: Insufficient documentation

## 2021-05-21 DIAGNOSIS — D225 Melanocytic nevi of trunk: Secondary | ICD-10-CM | POA: Insufficient documentation

## 2021-05-21 DIAGNOSIS — L578 Other skin changes due to chronic exposure to nonionizing radiation: Secondary | ICD-10-CM | POA: Insufficient documentation

## 2021-05-21 DIAGNOSIS — K5792 Diverticulitis of intestine, part unspecified, without perforation or abscess without bleeding: Secondary | ICD-10-CM

## 2021-05-21 DIAGNOSIS — L918 Other hypertrophic disorders of the skin: Secondary | ICD-10-CM | POA: Insufficient documentation

## 2021-05-21 DIAGNOSIS — D2261 Melanocytic nevi of right upper limb, including shoulder: Secondary | ICD-10-CM | POA: Insufficient documentation

## 2021-05-21 DIAGNOSIS — L821 Other seborrheic keratosis: Secondary | ICD-10-CM | POA: Insufficient documentation

## 2021-05-21 MED ORDER — FLUCONAZOLE 100 MG PO TABS: ORAL_TABLET | ORAL | 0 refills | Status: DC

## 2021-05-21 NOTE — Progress Notes (Signed)
?  Established patient acute visit ? ? ?[name]   DOB: 22-Nov-1965   56 y.o. Female  MRN: [id number] ?Visit Date: 05/21/2021 ? ?Chief Complaint  ?Patient presents with  ? Acute Visit  ? ?Subjective  ?  ?HPI  ?Patient presents with c/o thrush. Reports noticing white lesions on her tongue. Also has noticed altered taste. Denies fever, dysphagia or night sweats. Has been taking metronidazole and ciprofloxacin for diverticulitis. Reports abdominal pain is much better. Gradually advancing her diet. ? ? ? ?Medications: ?Outpatient Medications Prior to Visit  ?Medication Sig  ? amoxicillin (AMOXIL) 500 MG capsule Take 4 pills one hour prior to dental work  ? atorvastatin (LIPITOR) 10 MG tablet TAKE 1 TABLET BY MOUTH EVERY DAY  ? Cholecalciferol (VITAMIN D3) 3000 UNITS TABS Take 3,000 Units by mouth daily.  ? ciprofloxacin (CIPRO) 500 MG tablet Take 1 tablet (500 mg total) by mouth every 12 (twelve) hours for 7 days.  ? Coenzyme Q10 200 MG capsule Take 400 mg by mouth daily.  ? metroNIDAZOLE (FLAGYL) 500 MG tablet Take 1 tablet (500 mg total) by mouth 3 (three) times daily for 7 days.  ? Multiple Vitamin (MULTIVITAMIN PO) Take 1 tablet by mouth daily.  ? omeprazole (PRILOSEC) 20 MG capsule Take 20 mg by mouth daily as needed (acid reflux).  ? SYNTHROID 75 MCG tablet TAKE 1 TABLET BY MOUTH EVERY DAY  ? ?No facility-administered medications prior to visit.  ? ? ?Review of Systems ?Review of Systems:  ?A fourteen system review of systems was performed and found to be positive as per HPI. ? ? ?  Objective  ?  ?BP 117/75   Pulse 84   Temp 97.9 ?F (36.6 ?C)   Ht '5\' 3"'$  (1.6 m)   Wt 168 lb (76.2 kg)   SpO2 98%   BMI 29.76 kg/m?  ? ? ?Physical Exam  ?General:  Well Developed, well nourished, appropriate for stated age.  ?Neuro:  Alert and oriented,  extra-ocular muscles intact  ?HEENT:  Normocephalic, atraumatic, neck supple  ?Skin:  white, curd-like patches on tongue and buccal mucosa ?Abdomen: +BS, non-distended, no  LLQ tenderness ?Cardiac:  RRR, S1 S2 ?Respiratory: CTA B/L  ?Vascular:  Ext warm, no cyanosis apprec.; cap RF less 2 sec. ?Psych:  No HI/SI, judgement and insight good, Euthymic mood. Full Affect. ? ? ?No results found for any visits on 05/21/21. ? Assessment & Plan  ?  ? ?Thrush: ?-Patient has s/sx of moderate thrush likely secondary to current antibiotic use so will start oral anti-fungal therapy with fluconazole 200 mg for 1 dose and 100 mg daily x 6 days. Follow-up if symptoms fail to improve or worsen. ? ?Diverticulitis: ?-Reviewed ED note/s and labs. ?-Improving. Recommend to complete antibiotic therapy (Cipro and Flagyl) as directed.  ? ? ?Return if symptoms worsen or fail to improve.  ?   ? ? ? ?Lorrene Reid, PA-C  ?[facility] ?[phone] (phone) ?[phone] (fax) ? ? [facility] ?

## 2021-05-21 NOTE — ED Provider Notes (Signed)
Patient sent to ED for imaging.  ?  ?Francene Finders, PA-C ?05/21/21 0825 ? ?

## 2021-05-21 NOTE — Patient Instructions (Signed)
Oral Thrush, Adult ?Oral thrush, also called oral candidiasis, is a fungal infection that develops in the mouth and throat and on the tongue. It causes white patches to form in the mouth and on the tongue. ?Many cases of thrush are mild, but this infection can also be serious. [name] can be a repeated (recurrent) problem for certain people who have a weak body defense system (immune system). The weakness can be caused by chronic illnesses, or by taking medicines that limit the body's ability to fight infection. If a person has difficulty fighting infection, the fungus that causes thrush can spread through the body. This can cause life-threatening blood or organ infections. ?What are the causes? ?This condition is caused by a fungus (yeast) called Candida albicans. ?This fungus is normally present in small amounts in the mouth and on other mucous membranes. It usually causes no harm. ?If conditions are present that allow the fungus to grow without control, it invades surrounding tissues and becomes an infection. ?Other Candida species can also lead to thrush, though this is rare. ?What increases the risk? ?The following factors may make you more likely to develop this condition: ?Having a weakened immune system. ?Being an older adult. ?Having diabetes, cancer, or HIV (human immunodeficiency virus). ?Having dry mouth (xerostomia). ?Being pregnant or breastfeeding. ?Having poor dental care, especially in those who have dentures. ?Using antibiotic or steroid medicines. ?What are the signs or symptoms? ?Symptoms of this condition can vary from mild and moderate to severe and persistent. Symptoms may include: ?A burning feeling in the mouth and throat. This can occur at the start of a thrush infection. ?White patches that stick to the mouth and tongue. The tissue around the patches may be red, raw, and painful. If rubbed (during tooth brushing, for example), the patches and the tissue of the mouth may bleed easily. ?A bad  taste in the mouth or difficulty tasting foods. ?A cottony feeling in the mouth. ?Pain during eating and swallowing. ?Poor appetite. ?Cracking at the corners of the mouth. ?How is this diagnosed? ?This condition is diagnosed based on: ?A physical exam. ?Your medical history. ?How is this treated? ?This condition is treated with medicines called antifungals, which prevent the growth of fungi. These medicines are either applied directly to the affected area (topical) or swallowed (oral). The treatment will depend on the severity of the condition. ?Mild cases of thrush may be treated with an antifungal mouth rinse or lozenges. Treatment usually lasts about 14 days. ?Moderate to severe cases of thrush can be treated with oral antifungal medicine, if they have spread to the esophagus. A topical antifungal medicine may also be used. For some severe infections, treatment may need to continue for more than 14 days. ?Oral antifungal medicines are rarely used during pregnancy because they may be harmful to the unborn child. If you are pregnant, talk with your health care provider about options for treatment. ?Persistent or recurrent thrush. For cases of thrush that do not go away or keep coming back: ?Treatment may be needed twice as long as the symptoms last. ?Treatment will include both oral and topical antifungal medicines. ?People with a weakened immune system can take an antifungal medicine on a continuous basis to prevent thrush infections. ?It is important to treat conditions that make a person more likely to get thrush, such as diabetes or HIV. ?Follow these instructions at home: ?Medicines ?Take or use over-the-counter and prescription medicines only as told by your health care provider. ?Talk with your health  care provider about an over-the-counter medicine called gentian violet, which kills bacteria and fungi. ?Relieving soreness and discomfort ?To help reduce the discomfort of thrush: ?Drink cold liquids such as  water or iced tea. ?Try flavored ice treats or frozen juices. ?Eat foods that are easy to swallow, such as gelatin, ice cream, or custard. ?Try drinking from a straw if the patches in your mouth are painful. ? ?General instructions ?Eat plain, unflavored yogurt as directed by your health care provider. Check the label to make sure the yogurt contains live cultures. This yogurt can help healthy bacteria grow in the mouth and can stop the growth of the fungus that causes thrush. ?If you wear dentures, remove the dentures before going to bed, brush them vigorously, and soak them in a cleaning solution as directed by your health care provider. ?Rinse your mouth with a warm salt-water mixture several times a day. To make a salt-water mixture, dissolve ?-1 tsp (3-6 g) of salt in 1 cup (237 mL) of warm water. ?Contact a health care provider if: ?Your symptoms are getting worse or are not improving within 7 days of starting treatment. ?You have symptoms of a spreading infection, such as white patches on the skin outside of the mouth. ?You are breastfeeding your baby and you have redness and pain in the nipples. ?Summary ?Oral thrush, also called oral candidiasis, is a fungal infection that develops in the mouth and throat and on the tongue. It causes white patches to form in the mouth and on the tongue. ?You are more likely to get this condition if you have a weakened immune system or an underlying condition, such as HIV, cancer, or diabetes. ?This condition is treated with medicines called antifungals, which prevent the growth of fungi. ?Contact a health care provider if your symptoms do not improve, or get worse, within 7 days of starting treatment. ?This information is not intended to replace advice given to you by your health care provider. Make sure you discuss any questions you have with your health care provider. ?Document Revised: 01/01/2019 Document Reviewed: 01/01/2019 ?Elsevier Patient Education ? [place]. ? ?

## 2021-05-28 ENCOUNTER — Encounter: Payer: Self-pay | Admitting: Physician Assistant

## 2021-05-28 ENCOUNTER — Other Ambulatory Visit: Payer: Self-pay | Admitting: Physician Assistant

## 2021-05-28 DIAGNOSIS — B370 Candidal stomatitis: Secondary | ICD-10-CM

## 2021-05-28 MED ORDER — FLUCONAZOLE 100 MG PO TABS: 100.0000 mg | ORAL_TABLET | Freq: Every day | ORAL | 0 refills | Status: AC

## 2021-07-21 ENCOUNTER — Other Ambulatory Visit: Payer: Self-pay | Admitting: Physician Assistant

## 2021-07-21 DIAGNOSIS — E039 Hypothyroidism, unspecified: Secondary | ICD-10-CM

## 2021-07-30 ENCOUNTER — Encounter: Payer: Self-pay | Admitting: Physician Assistant

## 2021-08-08 ENCOUNTER — Encounter: Payer: Self-pay | Admitting: Physician Assistant

## 2021-08-08 ENCOUNTER — Ambulatory Visit (INDEPENDENT_AMBULATORY_CARE_PROVIDER_SITE_OTHER): Payer: No Typology Code available for payment source | Admitting: Physician Assistant

## 2021-08-08 VITALS — BP 121/77 | HR 79 | Temp 97.7°F | Ht 63.0 in | Wt 172.0 lb

## 2021-08-08 DIAGNOSIS — E782 Mixed hyperlipidemia: Secondary | ICD-10-CM | POA: Diagnosis not present

## 2021-08-08 DIAGNOSIS — E039 Hypothyroidism, unspecified: Secondary | ICD-10-CM

## 2021-08-08 DIAGNOSIS — R7303 Prediabetes: Secondary | ICD-10-CM | POA: Diagnosis not present

## 2021-08-08 NOTE — Assessment & Plan Note (Signed)
-  Last A1c 5.7, will repeat today. Discussed with patient low carbohydrate and glucose diet. Will continue to monitor.

## 2021-08-08 NOTE — Patient Instructions (Signed)

## 2021-08-08 NOTE — Assessment & Plan Note (Signed)
-  Last lipid panel wnl's, LDL 74. -Continue current medication regimen. Will collect CMP for medication monitoring. -Will continue to monitor.

## 2021-08-08 NOTE — Assessment & Plan Note (Addendum)
-  Last TSH wnl at 1.260. -Continue current medication regimen. -Rechecking TSH today. Pending results will make medication adjustments if indicated.

## 2021-08-08 NOTE — Progress Notes (Signed)
Established patient visit   [name]   DOB: 1966-02-16   56 y.o. Female  MRN: [id number] Visit Date: 08/08/2021  Chief Complaint  Patient presents with   Follow-up   Subjective    HPI  Patient presents for chronic follow-up.   Prediabetes: Pt denies increased urination or thirst.   Thyroid: Reports medication compliance, rarely misses a dose of Synthroid.   HLD: Pt taking medication as directed without issues. Reports since her hip surgery has been able to be more active.    Medications: Outpatient Medications Prior to Visit  Medication Sig   atorvastatin (LIPITOR) 10 MG tablet TAKE 1 TABLET BY MOUTH EVERY DAY   Cholecalciferol (VITAMIN D3) 3000 UNITS TABS Take 3,000 Units by mouth daily.   Coenzyme Q10 200 MG capsule Take 400 mg by mouth daily.   Multiple Vitamin (MULTIVITAMIN PO) Take 1 tablet by mouth daily.   omeprazole (PRILOSEC) 20 MG capsule Take 20 mg by mouth daily as needed (acid reflux).   SYNTHROID 75 MCG tablet TAKE 1 TABLET BY MOUTH EVERY DAY   [DISCONTINUED] amoxicillin (AMOXIL) 500 MG capsule Take 4 pills one hour prior to dental work   No facility-administered medications prior to visit.    Review of Systems Review of Systems:  A fourteen system review of systems was performed and found to be positive as per HPI.  Last CBC Lab Results  Component Value Date   WBC 11.1 (H) 05/17/2021   HGB 13.7 05/17/2021   HCT 39.6 05/17/2021   MCV 87.0 05/17/2021   MCH 30.1 05/17/2021   RDW 12.3 05/17/2021   PLT 197 71/08/2692   Last metabolic panel Lab Results  Component Value Date   GLUCOSE 101 (H) 05/17/2021   NA 139 05/17/2021   K 3.6 05/17/2021   CL 103 05/17/2021   CO2 27 05/17/2021   BUN 12 05/17/2021   CREATININE 0.67 05/17/2021   GFRNONAA >60 05/17/2021   CALCIUM 9.6 05/17/2021   PROT 7.4 05/17/2021   ALBUMIN 4.6 05/17/2021   LABGLOB 1.9 01/26/2021   AGRATIO 2.6 (H) 01/26/2021   BILITOT 1.8 (H) 05/17/2021   ALKPHOS 100  05/17/2021   AST 12 (L) 05/17/2021   ALT 12 05/17/2021   ANIONGAP 9 05/17/2021   Last lipids Lab Results  Component Value Date   CHOL 136 01/12/2021   HDL 41 01/12/2021   LDLCALC 74 01/12/2021   TRIG 118 01/12/2021   CHOLHDL 3.3 01/12/2021   Last hemoglobin A1c Lab Results  Component Value Date   HGBA1C 5.7 (H) 01/12/2021   Last thyroid functions Lab Results  Component Value Date   TSH 1.260 01/12/2021   Last vitamin D No results found for: 25OHVITD2, 25OHVITD3, VD25OH   Objective    BP 121/77   Pulse 79   Temp 97.7 F (36.5 C)   Ht '5\' 3"'  (1.6 m)   Wt 172 lb (78 kg)   SpO2 97%   BMI 30.47 kg/m  BP Readings from Last 3 Encounters:  08/08/21 121/77  05/21/21 117/75  05/17/21 122/66   Wt Readings from Last 3 Encounters:  08/08/21 172 lb (78 kg)  05/21/21 168 lb (76.2 kg)  05/17/21 171 lb (77.6 kg)    Physical Exam  General:  Well Developed, well nourished, appropriate for stated age.  Neuro:  Alert and oriented,  extra-ocular muscles intact  HEENT:  Normocephalic, atraumatic, neck supple  Skin:  no gross rash, warm, pink. Cardiac:  RRR, S1 S2 Respiratory: CTA B/L  Vascular:  Ext warm, no cyanosis apprec.; cap RF less 2 sec. Psych:  No HI/SI, judgement and insight good, Euthymic mood. Full Affect.   No results found for any visits on 08/08/21.  Assessment & Plan      Problem List Items Addressed This Visit       Endocrine   Hypothyroidism    -Last TSH wnl at 1.260. -Continue current medication regimen. -Rechecking TSH today. Pending results will make medication adjustments if indicated.       Relevant Orders   TSH     Other   Prediabetes    -Last A1c 5.7, will repeat today. Discussed with patient low carbohydrate and glucose diet. Will continue to monitor.       Relevant Orders   HgB A1c   Comp Met (CMET)   Mixed hyperlipidemia - Primary    -Last lipid panel wnl's, LDL 74. -Continue current medication regimen. Will collect CMP for  medication monitoring. -Will continue to monitor.       Relevant Orders   Comp Met (CMET)     Return in about 6 months (around 02/07/2022) for CPE and FBW.        Lorrene Reid, PA-C  [facility] at [facility] [phone] (phone) [phone] (fax)  [place]

## 2021-08-09 LAB — COMPREHENSIVE METABOLIC PANEL
ALT: 13 IU/L (ref 0–32)
AST: 15 IU/L (ref 0–40)
Albumin/Globulin Ratio: 2.1 (ref 1.2–2.2)
Albumin: 4.7 g/dL (ref 3.8–4.9)
Alkaline Phosphatase: 112 IU/L (ref 44–121)
BUN/Creatinine Ratio: 18 (ref 9–23)
BUN: 13 mg/dL (ref 6–24)
Bilirubin Total: 1.4 mg/dL — ABNORMAL HIGH (ref 0.0–1.2)
CO2: 23 mmol/L (ref 20–29)
Calcium: 9.4 mg/dL (ref 8.7–10.2)
Chloride: 101 mmol/L (ref 96–106)
Creatinine, Ser: 0.72 mg/dL (ref 0.57–1.00)
Globulin, Total: 2.2 g/dL (ref 1.5–4.5)
Glucose: 96 mg/dL (ref 70–99)
Potassium: 4.1 mmol/L (ref 3.5–5.2)
Sodium: 139 mmol/L (ref 134–144)
Total Protein: 6.9 g/dL (ref 6.0–8.5)
eGFR: 98 mL/min/{1.73_m2} (ref >59)

## 2021-08-09 LAB — TSH: TSH: 1.32 u[IU]/mL (ref 0.450–4.500)

## 2021-08-09 LAB — HEMOGLOBIN A1C
Est. average glucose Bld gHb Est-mCnc: 108 mg/dL
Hgb A1c MFr Bld: 5.4 % (ref 4.8–5.6)

## 2021-08-14 ENCOUNTER — Ambulatory Visit (INDEPENDENT_AMBULATORY_CARE_PROVIDER_SITE_OTHER): Payer: No Typology Code available for payment source

## 2021-08-14 ENCOUNTER — Ambulatory Visit: Payer: No Typology Code available for payment source | Admitting: Orthopaedic Surgery

## 2021-08-14 DIAGNOSIS — Z96642 Presence of left artificial hip joint: Secondary | ICD-10-CM

## 2021-08-14 NOTE — Progress Notes (Signed)
Office Visit Note   [name]           Date of Birth: [birth year]           MRN: [id number] Visit Date: 08/14/2021              Requested by: Lorrene Reid, PA-C [place],  [place] [address] PCP: Lorrene Reid, PA-C   Assessment & Plan: Visit Diagnoses:  1. Status post total hip replacement, left     Plan: Aishi is 1 year status post left total hip replacement on 08/21/2020.  She is overall very happy with the outcome.  She states that she feels better overall since the hip replacement.  No complaints other than some soreness with overactivity.  Examination of the left hip shows a fully healed surgical scar.  Normal gait and hip range of motion without pain.  X-rays demonstrate stable total hip replacement without complications.  Terriana has done very well from her surgery.  Dental prophylaxis reinforced for another year.  Activity as tolerated.  Follow-up in a year with standing AP pelvis and lateral hip x-rays.  Follow-Up Instructions: Return in about 1 year (around 08/15/2022).   Orders:  Orders Placed This Encounter  Procedures   XR Pelvis 1-2 Views   No orders of the defined types were placed in this encounter.     Procedures: No procedures performed   Clinical Data: No additional findings.   Subjective: Chief Complaint  Patient presents with   Left Hip - Follow-up    HPI  Review of Systems   Objective: Vital Signs: There were no vitals taken for this visit.  Physical Exam  Ortho Exam  Specialty Comments:  No specialty comments available.  Imaging: XR Pelvis 1-2 Views  Result Date: 08/14/2021 Stable total hip replacement without complications    PMFS History: Patient Active Problem List   Diagnosis Date Noted   Prediabetes 08/08/2021   Mixed hyperlipidemia 08/08/2021   Lentigo 05/21/2021   Melanocytic nevi of right upper limb, including shoulder 05/21/2021   Melanocytic nevi of trunk 05/21/2021    Neoplasm of uncertain behavior of skin 05/21/2021   Other skin changes due to chronic exposure to nonionizing radiation 05/21/2021   Other seborrheic keratosis 05/21/2021   Skin tag 05/21/2021   Status post total replacement of left hip 08/21/2020   Vitamin D deficiency 07/13/2020   Pure hypercholesterolemia 07/13/2020   Gastroesophageal reflux disease 07/13/2020   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   Allergic rhinitis due to pollen 07/13/2020   Pain of right heel 07/07/2020   Primary osteoarthritis of left hip 04/25/2020   Hypothyroidism 09/30/2019   Heartburn 09/30/2019   Left hip pain 08/06/2017   Cholelithiases 10/11/2010   Past Medical History:  Diagnosis Date   Generalized headaches    GERD (gastroesophageal reflux disease)    Hypothyroidism    Thyroid disease    hypo    Family History  Problem Relation Age of Onset   Stroke Father    Transient ischemic attack Father    Heart disease Mother    COPD Mother    Cancer Mother    Breast cancer Mother    Heart attack Mother    Diabetes Mother    Breast cancer Maternal Aunt    Breast cancer Maternal Grandmother    Diabetes Paternal Grandmother     Past Surgical History:  Procedure Laterality Date   ABDOMINAL HYSTERECTOMY N/A    Phreesia 08/30/2019  APPENDECTOMY  2003   CESAREAN SECTION  2005   CESAREAN SECTION N/A    Phreesia 08/30/2019   CHOLECYSTECTOMY     DIAGNOSTIC LAPAROSCOPY     Lap chole.   ROBOTIC ASSISTED TOTAL HYSTERECTOMY Bilateral 09/29/2012   Procedure: ROBOTIC ASSISTED TOTAL HYSTERECTOMY WITH BILATERAL SALPINGECTOMY;  Surgeon: Marvene Staff, MD;  Location: [facility];  Service: Gynecology;  Laterality: Bilateral;  3 hrs.   TONSILLECTOMY     TONSILLECTOMY  1969   TOTAL HIP ARTHROPLASTY Left 08/21/2020   Procedure: LEFT TOTAL HIP ARTHROPLASTY ANTERIOR APPROACH;  Surgeon: Leandrew Koyanagi, MD;  Location: [place];  Service: Orthopedics;  Laterality: Left;  3-C   Social History   Occupational  History   Occupation: document Games developer: [employer]  Tobacco Use   Smoking status: Former    Packs/day: 0.25    Types: Cigarettes    Quit date: 03/11/2013    Years since quitting: 8.4   Smokeless tobacco: Never  Vaping Use   Vaping Use: Never used  Substance and Sexual Activity   Alcohol use: Yes    Comment: rare   Drug use: No   Sexual activity: Yes    Birth control/protection: None

## 2021-09-03 ENCOUNTER — Other Ambulatory Visit: Payer: Self-pay | Admitting: Physician Assistant

## 2021-09-03 DIAGNOSIS — E1169 Type 2 diabetes mellitus with other specified complication: Secondary | ICD-10-CM

## 2021-09-24 LAB — HM PAP SMEAR: HPV, high-risk: NEGATIVE

## 2021-11-09 ENCOUNTER — Encounter: Payer: Self-pay | Admitting: Physician Assistant

## 2021-12-22 ENCOUNTER — Other Ambulatory Visit: Payer: Self-pay | Admitting: Physician Assistant

## 2021-12-22 DIAGNOSIS — E1169 Type 2 diabetes mellitus with other specified complication: Secondary | ICD-10-CM

## 2021-12-25 ENCOUNTER — Encounter: Payer: Self-pay | Admitting: Physician Assistant

## 2021-12-26 ENCOUNTER — Ambulatory Visit: Payer: No Typology Code available for payment source | Admitting: Physician Assistant

## 2021-12-26 ENCOUNTER — Encounter: Payer: Self-pay | Admitting: Physician Assistant

## 2021-12-26 VITALS — BP 131/84 | HR 67 | Temp 98.1°F | Ht 63.0 in | Wt 175.0 lb

## 2021-12-26 DIAGNOSIS — R300 Dysuria: Secondary | ICD-10-CM | POA: Diagnosis not present

## 2021-12-26 DIAGNOSIS — Z23 Encounter for immunization: Secondary | ICD-10-CM

## 2021-12-26 DIAGNOSIS — N3091 Cystitis, unspecified with hematuria: Secondary | ICD-10-CM

## 2021-12-26 LAB — POCT URINALYSIS DIPSTICK
Bilirubin, UA: NEGATIVE
Glucose, UA: NEGATIVE
Ketones, UA: NEGATIVE
Nitrite, UA: POSITIVE
Protein, UA: NEGATIVE
Spec Grav, UA: 1.015 (ref 1.010–1.025)
Urobilinogen, UA: 0.2 E.U./dL
pH, UA: 7 (ref 5.0–8.0)

## 2021-12-26 MED ORDER — FLUCONAZOLE 150 MG PO TABS: 150.0000 mg | ORAL_TABLET | Freq: Once | ORAL | 0 refills | Status: AC

## 2021-12-26 MED ORDER — NITROFURANTOIN MONOHYD MACRO 100 MG PO CAPS
100.0000 mg | ORAL_CAPSULE | Freq: Two times a day (BID) | ORAL | 0 refills | Status: DC
Start: 2021-12-26 — End: 2022-03-21

## 2021-12-26 NOTE — Progress Notes (Signed)
  Established patient acute visit   [name]   DOB: 1965-10-15   56 y.o. Female  MRN: [id number] Visit Date: 12/26/2021  Chief Complaint  Patient presents with   Acute Visit   Subjective    HPI  Patient presents for c/o burning with urination and urgency. Symptoms started 4 days ago. Also reports having vaginal itching. Reports was treated for UTI and yeast infection last month. States did not fully complete the antibiotic because she developed diarrhea. Has been increasing her water intake. Also wants to get her flu shot.    Medications: Outpatient Medications Prior to Visit  Medication Sig   atorvastatin (LIPITOR) 10 MG tablet TAKE 1 TABLET BY MOUTH EVERY DAY   Cholecalciferol (VITAMIN D3) 3000 UNITS TABS Take 3,000 Units by mouth daily.   Coenzyme Q10 200 MG capsule Take 400 mg by mouth daily.   Multiple Vitamin (MULTIVITAMIN PO) Take 1 tablet by mouth daily.   omeprazole (PRILOSEC) 20 MG capsule Take 20 mg by mouth daily as needed (acid reflux).   SYNTHROID 75 MCG tablet TAKE 1 TABLET BY MOUTH EVERY DAY   No facility-administered medications prior to visit.    Review of Systems Review of Systems:  A fourteen system review of systems was performed and found to be positive as per HPI.     Objective    BP 131/84   Pulse 67   Temp 98.1 F (36.7 C) (Temporal)   Ht '5\' 3"'$  (1.6 m)   Wt 175 lb (79.4 kg)   SpO2 97%   BMI 31.00 kg/m    Physical Exam  General:  Well Developed, well nourished, appropriate for stated age.  Neuro:  Alert and oriented,  extra-ocular muscles intact  HEENT:  Normocephalic, atraumatic, neck supple  Skin:  no gross rash, warm, pink. Abdomen: non-distended, non-tender, no CVA tenderness Cardiac:  RRR, S1 S2 Respiratory: CTA B/L w/o wheezing, crackles or rales. Vascular:  Ext warm, no cyanosis apprec.; cap RF less 2 sec. Psych:  No HI/SI, judgement and insight good, Euthymic mood. Full Affect.   Results for orders placed or  performed in visit on 12/26/21  POCT Urinalysis Dipstick  Result Value Ref Range   Color, UA yellow    Clarity, UA clear    Glucose, UA Negative Negative   Bilirubin, UA negative    Ketones, UA negative    Spec Grav, UA 1.015 1.010 - 1.025   Blood, UA trace-intact    pH, UA 7.0 5.0 - 8.0   Protein, UA Negative Negative   Urobilinogen, UA 0.2 0.2 or 1.0 E.U./dL   Nitrite, UA positive    Leukocytes, UA Small (1+) (A) Negative   Appearance     Odor      Assessment & Plan     Discussed with patient urinary symptoms and UA results are consistent with UTI. Will start empiric antibiotic therapy with Macrobid 100 mg BID x 5 days and pending urine culture results will adjust treatment plan if indicated. Recommend to continue with plenty of fluids. Yeast infection likely secondary to antibiotic use so will start fluconazole 150 mg x 2 doses.    Return if symptoms worsen or fail to improve.        Lorrene Reid, PA-C  [facility] at [facility] [phone] (phone) [phone] (fax)  [place]

## 2021-12-26 NOTE — Patient Instructions (Signed)
Urinary Tract Infection, Adult A urinary tract infection (UTI) is an infection of any part of the urinary tract. The urinary tract includes: The kidneys. The ureters. The bladder. The urethra. These organs make, store, and get rid of pee (urine) in the body. What are the causes? This infection is caused by germs (bacteria) in your genital area. These germs grow and cause swelling (inflammation) of your urinary tract. What increases the risk? The following factors may make you more likely to develop this condition: Using a small, thin tube (catheter) to drain pee. Not being able to control when you pee or poop (incontinence). Being female. If you are female, these things can increase the risk: Using these methods to prevent pregnancy: A medicine that kills sperm (spermicide). A device that blocks sperm (diaphragm). Having low levels of a female hormone (estrogen). Being pregnant. You are more likely to develop this condition if: You have genes that add to your risk. You are sexually active. You take antibiotic medicines. You have trouble peeing because of: A prostate that is bigger than normal, if you are female. A blockage in the part of your body that drains pee from the bladder. A kidney stone. A nerve condition that affects your bladder. Not getting enough to drink. Not peeing often enough. You have other conditions, such as: Diabetes. A weak disease-fighting system (immune system). Sickle cell disease. Gout. Injury of the spine. What are the signs or symptoms? Symptoms of this condition include: Needing to pee right away. Peeing small amounts often. Pain or burning when peeing. Blood in the pee. Pee that smells bad or not like normal. Trouble peeing. Pee that is cloudy. Fluid coming from the vagina, if you are female. Pain in the belly or lower back. Other symptoms include: Vomiting. Not feeling hungry. Feeling mixed up (confused). This may be the first symptom in  older adults. Being tired and grouchy (irritable). A fever. Watery poop (diarrhea). How is this treated? Taking antibiotic medicine. Taking other medicines. Drinking enough water. In some cases, you may need to see a specialist. Follow these instructions at home:  Medicines Take over-the-counter and prescription medicines only as told by your doctor. If you were prescribed an antibiotic medicine, take it as told by your doctor. Do not stop taking it even if you start to feel better. General instructions Make sure you: Pee until your bladder is empty. Do not hold pee for a long time. Empty your bladder after sex. Wipe from front to back after peeing or pooping if you are a female. Use each tissue one time when you wipe. Drink enough fluid to keep your pee pale yellow. Keep all follow-up visits. Contact a doctor if: You do not get better after 1-2 days. Your symptoms go away and then come back. Get help right away if: You have very bad back pain. You have very bad pain in your lower belly. You have a fever. You have chills. You feeling like you will vomit or you vomit. Summary A urinary tract infection (UTI) is an infection of any part of the urinary tract. This condition is caused by germs in your genital area. There are many risk factors for a UTI. Treatment includes antibiotic medicines. Drink enough fluid to keep your pee pale yellow. This information is not intended to replace advice given to you by your health care provider. Make sure you discuss any questions you have with your health care provider. Document Revised: 10/08/2019 Document Reviewed: 10/08/2019 Elsevier Patient Education    2023 [employer]  

## 2021-12-28 ENCOUNTER — Encounter: Payer: Self-pay | Admitting: Physician Assistant

## 2021-12-29 LAB — URINE CULTURE

## 2022-02-08 ENCOUNTER — Other Ambulatory Visit: Payer: No Typology Code available for payment source

## 2022-02-11 ENCOUNTER — Encounter: Payer: No Typology Code available for payment source | Admitting: Physician Assistant

## 2022-02-28 LAB — HM MAMMOGRAPHY

## 2022-03-06 ENCOUNTER — Other Ambulatory Visit: Payer: Self-pay

## 2022-03-06 DIAGNOSIS — Z0000 Encounter for general adult medical examination without abnormal findings: Secondary | ICD-10-CM

## 2022-03-06 DIAGNOSIS — R7303 Prediabetes: Secondary | ICD-10-CM

## 2022-03-06 DIAGNOSIS — E782 Mixed hyperlipidemia: Secondary | ICD-10-CM

## 2022-03-07 ENCOUNTER — Encounter: Payer: Self-pay | Admitting: Nurse Practitioner

## 2022-03-07 LAB — HM MAMMOGRAPHY

## 2022-03-14 ENCOUNTER — Other Ambulatory Visit: Payer: No Typology Code available for payment source

## 2022-03-14 DIAGNOSIS — R7303 Prediabetes: Secondary | ICD-10-CM

## 2022-03-14 DIAGNOSIS — E782 Mixed hyperlipidemia: Secondary | ICD-10-CM

## 2022-03-14 DIAGNOSIS — Z0000 Encounter for general adult medical examination without abnormal findings: Secondary | ICD-10-CM

## 2022-03-15 LAB — COMPREHENSIVE METABOLIC PANEL
ALT: 29 IU/L (ref 0–32)
AST: 20 IU/L (ref 0–40)
Albumin/Globulin Ratio: 2.3 — ABNORMAL HIGH (ref 1.2–2.2)
Albumin: 4.5 g/dL (ref 3.8–4.9)
Alkaline Phosphatase: 109 IU/L (ref 44–121)
BUN/Creatinine Ratio: 18 (ref 9–23)
BUN: 14 mg/dL (ref 6–24)
Bilirubin Total: 1.6 mg/dL — ABNORMAL HIGH (ref 0.0–1.2)
CO2: 24 mmol/L (ref 20–29)
Calcium: 9.3 mg/dL (ref 8.7–10.2)
Chloride: 102 mmol/L (ref 96–106)
Creatinine, Ser: 0.78 mg/dL (ref 0.57–1.00)
Globulin, Total: 2 g/dL (ref 1.5–4.5)
Glucose: 105 mg/dL — ABNORMAL HIGH (ref 70–99)
Potassium: 4.3 mmol/L (ref 3.5–5.2)
Sodium: 140 mmol/L (ref 134–144)
Total Protein: 6.5 g/dL (ref 6.0–8.5)
eGFR: 89 mL/min/{1.73_m2} (ref >59)

## 2022-03-15 LAB — CBC
Hematocrit: 42 % (ref 34.0–46.6)
Hemoglobin: 14.3 g/dL (ref 11.1–15.9)
MCH: 30.4 pg (ref 26.6–33.0)
MCHC: 34 g/dL (ref 31.5–35.7)
MCV: 89 fL (ref 79–97)
Platelets: 178 10*3/uL (ref 150–450)
RBC: 4.71 x10E6/uL (ref 3.77–5.28)
RDW: 12.5 % (ref 11.7–15.4)
WBC: 6 10*3/uL (ref 3.4–10.8)

## 2022-03-15 LAB — LIPID PANEL
Chol/HDL Ratio: 3.7 ratio (ref 0.0–4.4)
Cholesterol, Total: 173 mg/dL (ref 100–199)
HDL: 47 mg/dL (ref >39)
LDL Chol Calc (NIH): 102 mg/dL — ABNORMAL HIGH (ref 0–99)
Triglycerides: 138 mg/dL (ref 0–149)
VLDL Cholesterol Cal: 24 mg/dL (ref 5–40)

## 2022-03-15 LAB — HEMOGLOBIN A1C
Est. average glucose Bld gHb Est-mCnc: 111 mg/dL
Hgb A1c MFr Bld: 5.5 % (ref 4.8–5.6)

## 2022-03-15 LAB — TSH: TSH: 3.51 u[IU]/mL (ref 0.450–4.500)

## 2022-03-21 ENCOUNTER — Encounter: Payer: Self-pay | Admitting: Nurse Practitioner

## 2022-03-21 ENCOUNTER — Ambulatory Visit (INDEPENDENT_AMBULATORY_CARE_PROVIDER_SITE_OTHER): Payer: No Typology Code available for payment source | Admitting: Nurse Practitioner

## 2022-03-21 VITALS — BP 127/80 | HR 78 | Ht 63.0 in | Wt 175.6 lb

## 2022-03-21 DIAGNOSIS — Z6831 Body mass index (BMI) 31.0-31.9, adult: Secondary | ICD-10-CM

## 2022-03-21 DIAGNOSIS — E782 Mixed hyperlipidemia: Secondary | ICD-10-CM

## 2022-03-21 DIAGNOSIS — Z0001 Encounter for general adult medical examination with abnormal findings: Secondary | ICD-10-CM

## 2022-03-21 DIAGNOSIS — E039 Hypothyroidism, unspecified: Secondary | ICD-10-CM | POA: Diagnosis not present

## 2022-03-21 DIAGNOSIS — Z23 Encounter for immunization: Secondary | ICD-10-CM

## 2022-03-21 DIAGNOSIS — M1612 Unilateral primary osteoarthritis, left hip: Secondary | ICD-10-CM

## 2022-03-21 NOTE — Progress Notes (Signed)
Complete physical exam   [name]   DOB: September 20, 1965   57 y.o. Female  MRN: [id number] Visit Date: 03/21/2022    Chief Complaint  Patient presents with   Annual Exam   Subjective    [name] is a 57 y.o. female who presents today for a complete physical exam.  She reports consuming a  generally healthy  diet. Home exercise routine includes walking around track 3 to 4 times per week. She generally feels well. She does have additional problems to discuss today.   HPI  Annual physical  -Routine, fasting labs done prior to today --mild elevation of LDL with remaining lipid panel normal.  --other labs normal.  Left hip osteoarthritis  --limiting ability to exercise routinely   Past Medical History:  Diagnosis Date   Generalized headaches    GERD (gastroesophageal reflux disease)    Hypothyroidism    Thyroid disease    hypo   Past Surgical History:  Procedure Laterality Date   ABDOMINAL HYSTERECTOMY N/A    Phreesia 08/30/2019   APPENDECTOMY  2003   CESAREAN SECTION  2005   CESAREAN SECTION N/A    Phreesia 08/30/2019   CHOLECYSTECTOMY     DIAGNOSTIC LAPAROSCOPY     Lap chole.   ROBOTIC ASSISTED TOTAL HYSTERECTOMY Bilateral 09/29/2012   Procedure: ROBOTIC ASSISTED TOTAL HYSTERECTOMY WITH BILATERAL SALPINGECTOMY;  Surgeon: Marvene Staff, MD;  Location: [facility];  Service: Gynecology;  Laterality: Bilateral;  3 hrs.   TONSILLECTOMY     TONSILLECTOMY  1969   TOTAL HIP ARTHROPLASTY Left 08/21/2020   Procedure: LEFT TOTAL HIP ARTHROPLASTY ANTERIOR APPROACH;  Surgeon: Leandrew Koyanagi, MD;  Location: [facility];  Service: Orthopedics;  Laterality: Left;  3-C   Social History   Socioeconomic History   Marital status: Married    Spouse name: Jewel Baize   Number of children: 1   Years of education: Not on file   Highest education level: Bachelor's degree (e.g., BA, AB, BS)  Occupational History   Occupation: document Games developer: [employer]   Tobacco Use   Smoking status: Former    Packs/day: 0.25    Types: Cigarettes    Quit date: 03/11/2013    Years since quitting: 9.1   Smokeless tobacco: Never  Vaping Use   Vaping Use: Never used  Substance and Sexual Activity   Alcohol use: Yes    Comment: rare   Drug use: No   Sexual activity: Yes    Birth control/protection: None  Other Topics Concern   Not on file  Social History Narrative   Not on file   Social Determinants of Health   Financial Resource Strain: Not on file  Food Insecurity: Not on file  Transportation Needs: Not on file  Physical Activity: Not on file  Stress: Not on file  Social Connections: Not on file  Intimate Partner Violence: Not on file   Family Status  Relation Name Status   Father  Deceased   Mother  Alive   Sister  [place]  (Not Specified)   MGM  (Not Specified)   PGM  (Not Specified)   Family History  Problem Relation Age of Onset   Stroke Father    Transient ischemic attack Father    Heart disease Mother    COPD Mother    Cancer Mother    Breast cancer Mother    Heart attack Mother  Diabetes Mother    Breast cancer Maternal Aunt    Breast cancer Maternal Grandmother    Diabetes Paternal Grandmother    Allergies  Allergen Reactions   Succinylcholine Rash    Patient Care Team: Lorrene Reid, PA-C as PCP - General (Physician Assistant)   Medications: Outpatient Medications Prior to Visit  Medication Sig   atorvastatin (LIPITOR) 10 MG tablet TAKE 1 TABLET BY MOUTH EVERY DAY   Cholecalciferol (VITAMIN D3) 3000 UNITS TABS Take 3,000 Units by mouth daily.   Coenzyme Q10 200 MG capsule Take 400 mg by mouth daily.   Multiple Vitamin (MULTIVITAMIN PO) Take 1 tablet by mouth daily.   omeprazole (PRILOSEC) 20 MG capsule Take 20 mg by mouth daily as needed (acid reflux).   SYNTHROID 75 MCG tablet TAKE 1 TABLET BY MOUTH EVERY DAY   [DISCONTINUED] nitrofurantoin, macrocrystal-monohydrate, (MACROBID)  100 MG capsule Take 1 capsule (100 mg total) by mouth 2 (two) times daily.   No facility-administered medications prior to visit.    Review of Systems  Constitutional:  Negative for activity change, appetite change, chills, fatigue and fever.  HENT:  Negative for congestion, postnasal drip, rhinorrhea, sinus pressure, sinus pain, sneezing and sore throat.   Eyes: Negative.   Respiratory:  Negative for cough, chest tightness, shortness of breath and wheezing.   Cardiovascular:  Negative for chest pain and palpitations.  Gastrointestinal:  Negative for abdominal pain, constipation, diarrhea, nausea and vomiting.  Endocrine: Negative for cold intolerance, heat intolerance, polydipsia and polyuria.  Genitourinary:  Negative for dyspareunia, dysuria, flank pain, frequency and urgency.  Musculoskeletal:  Positive for arthralgias and myalgias. Negative for back pain.  Skin:  Negative for rash.  Allergic/Immunologic: Negative for environmental allergies.  Neurological:  Negative for dizziness, weakness and headaches.  Hematological:  Negative for adenopathy.  Psychiatric/Behavioral:  The patient is not nervous/anxious.     Last CBC Lab Results  Component Value Date   WBC 6.0 03/14/2022   HGB 14.3 03/14/2022   HCT 42.0 03/14/2022   MCV 89 03/14/2022   MCH 30.4 03/14/2022   RDW 12.5 03/14/2022   PLT 178 0000000   Last metabolic panel Lab Results  Component Value Date   GLUCOSE 105 (H) 03/14/2022   NA 140 03/14/2022   K 4.3 03/14/2022   CL 102 03/14/2022   CO2 24 03/14/2022   BUN 14 03/14/2022   CREATININE 0.78 03/14/2022   EGFR 89 03/14/2022   CALCIUM 9.3 03/14/2022   PROT 6.5 03/14/2022   ALBUMIN 4.5 03/14/2022   LABGLOB 2.0 03/14/2022   AGRATIO 2.3 (H) 03/14/2022   BILITOT 1.6 (H) 03/14/2022   ALKPHOS 109 03/14/2022   AST 20 03/14/2022   ALT 29 03/14/2022   ANIONGAP 9 05/17/2021   Last lipids Lab Results  Component Value Date   CHOL 173 03/14/2022   HDL 47  03/14/2022   LDLCALC 102 (H) 03/14/2022   TRIG 138 03/14/2022   CHOLHDL 3.7 03/14/2022   Last hemoglobin A1c Lab Results  Component Value Date   HGBA1C 5.5 03/14/2022   Last thyroid functions Lab Results  Component Value Date   TSH 3.510 03/14/2022        Objective     Today's Vitals   03/21/22 0919  BP: 127/80  Pulse: 78  SpO2: 97%  Weight: 175 lb 9.6 oz (79.7 kg)  Height: 5' 3"$  (1.6 m)   Body mass index is 31.11 kg/m.  BP Readings from Last 3 Encounters:  03/21/22 127/80  12/26/21 131/84  08/08/21 121/77    Wt Readings from Last 3 Encounters:  03/21/22 175 lb 9.6 oz (79.7 kg)  12/26/21 175 lb (79.4 kg)  08/08/21 172 lb (78 kg)     Physical Exam Vitals and nursing note reviewed.  Constitutional:      Appearance: Normal appearance. She is well-developed.  HENT:     Head: Normocephalic and atraumatic.     Right Ear: Tympanic membrane, ear canal and external ear normal.     Left Ear: Tympanic membrane, ear canal and external ear normal.     Nose: Nose normal.     Mouth/Throat:     Mouth: Mucous membranes are moist.     Pharynx: Oropharynx is clear.  Eyes:     Extraocular Movements: Extraocular movements intact.     Conjunctiva/sclera: Conjunctivae normal.     Pupils: Pupils are equal, round, and reactive to light.  Neck:     Vascular: No carotid bruit.  Cardiovascular:     Rate and Rhythm: Normal rate and regular rhythm.     Pulses: Normal pulses.     Heart sounds: Normal heart sounds.  Pulmonary:     Effort: Pulmonary effort is normal.     Breath sounds: Normal breath sounds.  Abdominal:     General: Bowel sounds are normal. There is no distension.     Palpations: Abdomen is soft. There is no mass.     Tenderness: There is no abdominal tenderness. There is no right CVA tenderness, left CVA tenderness, guarding or rebound.     Hernia: No hernia is present.  Musculoskeletal:        General: Normal range of motion.     Cervical back: Normal range  of motion and neck supple.  Lymphadenopathy:     Cervical: No cervical adenopathy.  Skin:    General: Skin is warm and dry.     Capillary Refill: Capillary refill takes less than 2 seconds.  Neurological:     General: No focal deficit present.     Mental Status: She is alert and oriented to person, place, and time.  Psychiatric:        Mood and Affect: Mood normal.        Behavior: Behavior normal.        Thought Content: Thought content normal.        Judgment: Judgment normal.     Last depression screening scores   Row Labels 03/21/2022    9:24 AM 12/26/2021   10:54 AM 08/08/2021   10:24 AM  PHQ 2/9 Scores   Section Header. No data exists in this row.     PHQ - 2 Score   0 0 0  PHQ- 9 Score   1 1 1   $ Last fall risk screening   Row Labels 12/26/2021   10:54 AM  Fall Risk    Section Header. No data exists in this row.   Falls in the past year?   0  Number falls in past yr:   0  Injury with Fall?   0  Risk for fall due to :   No Fall Risks  Follow up   Falls evaluation completed    Assessment & Plan    1. Encounter for general adult medical examination with abnormal findings Annual physical today   2. Mixed hyperlipidemia Very mild elevation of LDL. Continue atorvastatin as prescribed.   3. Acquired hypothyroidism Thyroid panel stable. Continue levothyroxine as prescribed   4. Primary osteoarthritis of left  hip Recommend she  take tylenol and/or ibuprofen to reduce pain and inflammation. Consider referral to orthopedics.   5. BMI 31.0-31.9,adult Discussed lowering calorie intake to 1500 calories per day and incorporating exercise into daily routine to help lose weight.      Immunization History  Administered Date(s) Administered   COVID-19, mRNA, vaccine(Comirnaty)12 years and older 03/23/2022   Influenza,inj,Quad PF,6+ Mos 12/30/2019, 01/15/2021, 12/26/2021   PFIZER(Purple Top)SARS-COV-2 Vaccination 06/19/2019, 07/12/2019, 02/12/2020   Tdap 07/30/2011    Tetanus 05/10/1999   Zoster Recombinat (Shingrix) 12/30/2019, 07/13/2020    Health Maintenance  Topic Date Due   DTaP/Tdap/Td (2 - Td or Tdap) 07/29/2021   COVID-19 Vaccine (5 - 2023-24 season) 05/18/2022   MAMMOGRAM  03/07/2024   PAP SMEAR-Modifier  09/21/2024   COLONOSCOPY (Pts 45-78yr Insurance coverage will need to be confirmed)  10/25/2026   INFLUENZA VACCINE  Completed   Hepatitis C Screening  Completed   HIV Screening  Completed   Zoster Vaccines- Shingrix  Completed   HPV VACCINES  Aged Out    Discussed health benefits of physical activity, and encouraged her to engage in regular exercise appropriate for her age and condition.  Problem List Items Addressed This Visit       Endocrine   Hypothyroidism     Musculoskeletal and Integument   Primary osteoarthritis of left hip     Other   Mixed hyperlipidemia   BMI 31.0-31.9,adult   Other Visit Diagnoses     Encounter for general adult medical examination with abnormal findings    -  Primary   Need for Tdap vaccination            Return in about 6 months (around 09/19/2022) for HLD . fasting lipids and CMP a week prior to visit .        HRonnell Freshwater NP  [phone](phone) [phone](fax)  [place]

## 2022-04-21 DIAGNOSIS — Z6831 Body mass index (BMI) 31.0-31.9, adult: Secondary | ICD-10-CM | POA: Insufficient documentation

## 2022-06-24 ENCOUNTER — Other Ambulatory Visit: Payer: Self-pay

## 2022-06-24 DIAGNOSIS — E039 Hypothyroidism, unspecified: Secondary | ICD-10-CM

## 2022-06-24 MED ORDER — SYNTHROID 75 MCG PO TABS: 75.0000 ug | ORAL_TABLET | Freq: Every day | ORAL | 0 refills | Status: DC

## 2022-07-01 ENCOUNTER — Encounter: Payer: Self-pay | Admitting: Family Medicine

## 2022-07-01 ENCOUNTER — Other Ambulatory Visit: Payer: Self-pay | Admitting: Family Medicine

## 2022-07-01 DIAGNOSIS — E039 Hypothyroidism, unspecified: Secondary | ICD-10-CM

## 2022-07-01 MED ORDER — SYNTHROID 75 MCG PO TABS: 75.0000 ug | ORAL_TABLET | Freq: Every day | ORAL | 0 refills | Status: DC

## 2022-07-03 ENCOUNTER — Encounter: Payer: Self-pay | Admitting: Orthopaedic Surgery

## 2022-07-03 ENCOUNTER — Encounter: Payer: Self-pay | Admitting: Family Medicine

## 2022-07-03 DIAGNOSIS — M5416 Radiculopathy, lumbar region: Secondary | ICD-10-CM

## 2022-07-05 MED ORDER — METHYLPREDNISOLONE 4 MG PO TBPK: ORAL_TABLET | ORAL | 0 refills | Status: DC

## 2022-07-05 NOTE — Addendum Note (Signed)
Addended by: Saralyn Pilar on: 07/05/2022 12:35 PM   Modules accepted: Orders

## 2022-07-12 ENCOUNTER — Ambulatory Visit (INDEPENDENT_AMBULATORY_CARE_PROVIDER_SITE_OTHER): Payer: No Typology Code available for payment source | Admitting: Physical Therapy

## 2022-07-12 ENCOUNTER — Other Ambulatory Visit: Payer: Self-pay

## 2022-07-12 ENCOUNTER — Encounter: Payer: Self-pay | Admitting: Physical Therapy

## 2022-07-12 DIAGNOSIS — M5459 Other low back pain: Secondary | ICD-10-CM

## 2022-07-12 DIAGNOSIS — R29898 Other symptoms and signs involving the musculoskeletal system: Secondary | ICD-10-CM

## 2022-07-12 DIAGNOSIS — M6281 Muscle weakness (generalized): Secondary | ICD-10-CM | POA: Diagnosis not present

## 2022-07-12 NOTE — Therapy (Signed)
OUTPATIENT PHYSICAL THERAPY THORACOLUMBAR EVALUATION   Patient Name: [name] MRN: [id number] DOB:[birth year], 57 y.o., female Today's Date: 07/12/2022  END OF SESSION:  PT End of Session - 07/12/22 1601     Visit Number 1    Number of Visits 13    Date for PT Re-Evaluation 08/23/22    Authorization Type [employer]    Authorization Time Period 07/12/22 to 08/23/22    PT Start Time 1516    PT Stop Time 1554    PT Time Calculation (min) 38 min    Activity Tolerance Patient tolerated treatment well    Behavior During Therapy [facility] for tasks assessed/performed             Past Medical History:  Diagnosis Date   Generalized headaches    GERD (gastroesophageal reflux disease)    Hypothyroidism    Thyroid disease    hypo   Past Surgical History:  Procedure Laterality Date   ABDOMINAL HYSTERECTOMY N/A    Phreesia 08/30/2019   APPENDECTOMY  2003   CESAREAN SECTION  2005   CESAREAN SECTION N/A    Phreesia 08/30/2019   CHOLECYSTECTOMY     DIAGNOSTIC LAPAROSCOPY     Lap chole.   ROBOTIC ASSISTED TOTAL HYSTERECTOMY Bilateral 09/29/2012   Procedure: ROBOTIC ASSISTED TOTAL HYSTERECTOMY WITH BILATERAL SALPINGECTOMY;  Surgeon: Serita Kyle, MD;  Location: [facility];  Service: Gynecology;  Laterality: Bilateral;  3 hrs.   TONSILLECTOMY     TONSILLECTOMY  1969   TOTAL HIP ARTHROPLASTY Left 08/21/2020   Procedure: LEFT TOTAL HIP ARTHROPLASTY ANTERIOR APPROACH;  Surgeon: Tarry Kos, MD;  Location: [facility];  Service: Orthopedics;  Laterality: Left;  3-C   Patient Active Problem List   Diagnosis Date Noted   BMI 31.0-31.9,adult 04/21/2022   Prediabetes 08/08/2021   Mixed hyperlipidemia 08/08/2021   Lentigo 05/21/2021   Melanocytic nevi of right upper limb, including shoulder 05/21/2021   Melanocytic nevi of trunk 05/21/2021   Neoplasm of uncertain behavior of skin 05/21/2021   Other skin changes due to chronic exposure to nonionizing radiation 05/21/2021   Other seborrheic  keratosis 05/21/2021   Skin tag 05/21/2021   Status post total replacement of left hip 08/21/2020   Vitamin D deficiency 07/13/2020   Pure hypercholesterolemia 07/13/2020   Gastroesophageal reflux disease 07/13/2020   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   Allergic rhinitis due to pollen 07/13/2020   Pain of right heel 07/07/2020   Primary osteoarthritis of left hip 04/25/2020   Hypothyroidism 09/30/2019   Heartburn 09/30/2019   Left hip pain 08/06/2017   Cholelithiases 10/11/2010    PCP: Melida Quitter, PA  REFERRING PROVIDER: Melida Quitter, PA  REFERRING DIAG:  M54.16 (ICD-10-CM) - Lumbar back pain with radiculopathy affecting left lower extremity    Rationale for Evaluation and Treatment: Rehabilitation  THERAPY DIAG:  Other low back pain  Muscle weakness (generalized)  Other symptoms and signs involving the musculoskeletal system  ONSET DATE: 07/05/2022  SUBJECTIVE:  SUBJECTIVE STATEMENT:  I've had some back pain and also some knee pain. I noticed it mostly, and it wasn't it "ow" moment like I threw out my back or anything, when I went on a 2 hour car ride and stepped out of the car and had some pain. Job is sedentary, so sometimes I try increasing exercise and it gets better but it didn't help; then I tried doing no exercise and that didn't help either. Blood clot got ruled out, then I was on prednisone that I just finished yesterday. Pain is still going down L LE down knee and down into my foot.   PERTINENT HISTORY:    PAIN:  Are you having pain? Yes: NPRS scale: 5/10 Pain location: back and going down L LE  Pain description: dull, gnawing  Aggravating factors: walking on hard surface like at [employer]  Relieving factors: prednisone, stretches that I was doing after hip  replacement   At worst up to 7/10  PRECAUTIONS: None  WEIGHT BEARING RESTRICTIONS: No  FALLS:  Has patient fallen in last 6 months? No  LIVING ENVIRONMENT: Lives with: lives with their family Lives in: House/apartment Stairs: 5 STE no rails, flight of steps inside the home U rail Has following equipment at home: Single point cane and Environmental consultant - 2 wheeled  OCCUPATION: computer work- very sedentary job   PLOF: Independent, Independent with basic ADLs, Independent with gait, and Independent with transfers  PATIENT GOALS: be able to walk 4 miles for fitness, hike, be able to do yard work    NEXT MD VISIT: Referring PRN   OBJECTIVE:   DIAGNOSTIC FINDINGS:    PATIENT SURVEYS:  FOTO 51, predicted 3  SCREENING FOR RED FLAGS: Bowel or bladder incontinence: No Spinal tumors: No Cauda equina syndrome: No Compression fracture: No Abdominal aneurysm: No  COGNITION: Overall cognitive status: Within functional limits for tasks assessed     SENSATION: Not tested  MUSCLE LENGTH:  Quads and hip flexors mod limitation B  Hamstrings mod limitation R piriformis severe limitation    POSTURE: rounded shoulders, forward head, increased lumbar lordosis, and decreased lumbar lordosis  PALPATION:  Lumbar paraspinals tight but not TTP, no areas of soreness L glutes/piriformis or hamstrings   LUMBAR ROM:   AROM eval  Flexion WNL; RFIS "hard to explain it feels worse in the leg but back is better"  Extension WNL; REIS "back is more sore"   Right lateral flexion Severe limitation  Left lateral flexion Severe limitation   Right rotation Severe limitation   Left rotation Mild limitation    (Blank rows = not tested)   LOWER EXTREMITY MMT:    MMT Right eval Left eval  Hip flexion 5 5  Hip extension 4+ 4+  Hip abduction 5 4  Hip adduction    Hip internal rotation    Hip external rotation    Knee flexion 5 5  Knee extension 5 5  Ankle dorsiflexion 5 5  Ankle  plantarflexion    Ankle inversion    Ankle eversion     (Blank rows = not tested)  LUMBAR SPECIAL TESTS:   No LLD noted     TODAY'S TREATMENT:  DATE:   Eval  Objective measures  and appropriate education, POC, HEP   TherEx  Nustep L6 x6 minutes BLEs only PPT 5x5 second holds  Lumbar rotation stretch 5x5 seconds B Supine HS stretch 1x30 seconds B     PATIENT EDUCATION:  Education details: exam findings, HEP, POC  Person educated: Patient Education method: Explanation, Demonstration, and Handouts Education comprehension: verbalized understanding, returned demonstration, and needs further education  HOME EXERCISE PROGRAM: Access Code: [email or web address] URL: [email or web address] Date: 07/12/2022 Prepared by: Nedra Hai  Exercises - Supine Posterior Pelvic Tilt  - 2 x daily - 7 x weekly - 1 sets - 10-15 reps - 5 hold - Supine Lower Trunk Rotation  - 2 x daily - 7 x weekly - 1 sets - 10 reps - 5 hold - Hooklying Hamstring Stretch with Strap  - 2 x daily - 7 x weekly - 1 sets - 3 reps - 30 hold  ASSESSMENT:  CLINICAL IMPRESSION: Patient is a 57 y.o. F who was seen today for physical therapy evaluation and treatment for low back pain. Examination primarily reveals some lumbar immobility, core weakness, and postural impairments. She is a bit of a poor historian with symptoms and when describing symptoms which did make differential dx a bit difficult. Will trial skilled PT in an effort to reduce pain and assist in optimizing overall level of function/returning to exercise.   OBJECTIVE IMPAIRMENTS: decreased mobility, difficulty walking, decreased ROM, decreased strength, impaired flexibility, postural dysfunction, and pain.   ACTIVITY LIMITATIONS: standing, squatting, sleeping, stairs, and locomotion level  PARTICIPATION LIMITATIONS: cleaning,  laundry, driving, shopping, community activity, occupation, and yard work  PERSONAL FACTORS: Age, Behavior pattern, Education, Fitness, Past/current experiences, Sex, and Social background are also affecting patient's functional outcome.   REHAB POTENTIAL: Good  CLINICAL DECISION MAKING: Stable/uncomplicated  EVALUATION COMPLEXITY: Low   GOALS: Goals reviewed with patient? Yes  SHORT TERM GOALS: Target date: 08/02/2022    Will be compliant with appropriate progressive HEP  Baseline: Goal status: INITIAL  2.  Pain to be no more than 5/10 at worst  Baseline:  Goal status: INITIAL  3.  Will demonstrate improved awareness of functional posture with all daily and work based tasks  Baseline:  Goal status: INITIAL  4.  All mm flexibility impairments to have resolved  Baseline:  Goal status: INITIAL   LONG TERM GOALS: Target date: 08/23/2022    MMT to be 5/5 in all tested groups  Baseline:  Goal status: INITIAL  2.  Will be able to hold standard forearm plank for at least 30 seconds in order to demonstrate improved core strength  Baseline:  Goal status: INITIAL  3.  Pain to be no more than 3/10 at worst  Baseline:  Goal status: INITIAL  4.  Will demonstrate good floor to waist lifting mechanics  Baseline:  Goal status: INITIAL  5.  Will have been able to return to regular walking and hiking routine without increase in pain  Baseline:  Goal status: INITIAL   PLAN:  PT FREQUENCY: 2x/week  PT DURATION: 6 weeks  PLANNED INTERVENTIONS: Therapeutic exercises, Therapeutic activity, Neuromuscular re-education, Balance training, Gait training, Patient/Family education, Self Care, Joint mobilization, Stair training, Aquatic Therapy, Dry Needling, Spinal mobilization, Cryotherapy, Moist heat, Taping, Traction, Ultrasound, Ionotophoresis 4mg /ml Dexamethasone, Manual therapy, and Re-evaluation.  PLAN FOR NEXT SESSION: core strengthening, postural training, biomechanics,  lumbar ROM and mm flexibility   Nedra Hai PT DPT PN2

## 2022-07-19 ENCOUNTER — Ambulatory Visit: Payer: No Typology Code available for payment source | Admitting: Rehabilitative and Restorative Service Providers"

## 2022-07-19 ENCOUNTER — Encounter: Payer: Self-pay | Admitting: Rehabilitative and Restorative Service Providers"

## 2022-07-19 DIAGNOSIS — R29898 Other symptoms and signs involving the musculoskeletal system: Secondary | ICD-10-CM | POA: Diagnosis not present

## 2022-07-19 DIAGNOSIS — M6281 Muscle weakness (generalized): Secondary | ICD-10-CM

## 2022-07-19 DIAGNOSIS — M5459 Other low back pain: Secondary | ICD-10-CM | POA: Diagnosis not present

## 2022-07-19 NOTE — Therapy (Signed)
OUTPATIENT PHYSICAL THERAPY THORACOLUMBAR TREATMENT   Patient Name: [name] MRN: [id number] DOB:06-Apr-1965, 57 y.o., female Today's Date: 07/19/2022  END OF SESSION:  PT End of Session - 07/19/22 0849     Visit Number 2    Number of Visits 13    Date for PT Re-Evaluation 08/23/22    Authorization Type [employer]    Authorization Time Period 07/12/22 to 08/23/22    PT Start Time 0848    PT Stop Time 0931    PT Time Calculation (min) 43 min    Activity Tolerance Patient tolerated treatment well;No increased pain    Behavior During Therapy WFL for tasks assessed/performed              Past Medical History:  Diagnosis Date   Generalized headaches    GERD (gastroesophageal reflux disease)    Hypothyroidism    Thyroid disease    hypo   Past Surgical History:  Procedure Laterality Date   ABDOMINAL HYSTERECTOMY N/A    [employer] 08/30/2019   APPENDECTOMY  2003   CESAREAN SECTION  2005   CESAREAN SECTION N/A    [employer] 08/30/2019   CHOLECYSTECTOMY     DIAGNOSTIC LAPAROSCOPY     Lap chole.   ROBOTIC ASSISTED TOTAL HYSTERECTOMY Bilateral 09/29/2012   Procedure: ROBOTIC ASSISTED TOTAL HYSTERECTOMY WITH BILATERAL SALPINGECTOMY;  Surgeon: Sara Kyle, MD;  Location: [facility];  Service: Gynecology;  Laterality: Bilateral;  3 hrs.   TONSILLECTOMY     TONSILLECTOMY  1969   TOTAL HIP ARTHROPLASTY Left 08/21/2020   Procedure: LEFT TOTAL HIP ARTHROPLASTY ANTERIOR APPROACH;  Surgeon: Sara Kos, MD;  Location: [facility];  Service: Orthopedics;  Laterality: Left;  3-C   Patient Active Problem List   Diagnosis Date Noted   BMI 31.0-31.9,adult 04/21/2022   Prediabetes 08/08/2021   Mixed hyperlipidemia 08/08/2021   Lentigo 05/21/2021   Melanocytic nevi of right upper limb, including shoulder 05/21/2021   Melanocytic nevi of trunk 05/21/2021   Neoplasm of uncertain behavior of skin 05/21/2021   Other skin changes due to chronic exposure to nonionizing radiation 05/21/2021    Other seborrheic keratosis 05/21/2021   Skin tag 05/21/2021   Status post total replacement of left hip 08/21/2020   Vitamin D deficiency 07/13/2020   Pure hypercholesterolemia 07/13/2020   Gastroesophageal reflux disease 07/13/2020   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   Allergic rhinitis due to pollen 07/13/2020   Pain of right heel 07/07/2020   Primary osteoarthritis of left hip 04/25/2020   Hypothyroidism 09/30/2019   Heartburn 09/30/2019   Left hip pain 08/06/2017   Cholelithiases 10/11/2010    PCP: Sara Quitter, PA  REFERRING PROVIDER: Melida Quitter, PA  REFERRING DIAG:  M54.16 (ICD-10-CM) - Lumbar back pain with radiculopathy affecting left lower extremity    Rationale for Evaluation and Treatment: Rehabilitation  THERAPY DIAG:  Other low back pain  Muscle weakness (generalized)  Other symptoms and signs involving the musculoskeletal system  ONSET DATE: 07/05/2022  SUBJECTIVE:  SUBJECTIVE STATEMENT: [name] notes low back pain and intermittent left sciatica to the foot has improved since her oral steroids.  She sees [name] again in July.    PERTINENT HISTORY:    PAIN:  Are you having pain? Yes: NPRS scale: 5/10 Pain location: back and going down L LE  Pain description: dull, gnawing  Aggravating factors: walking on hard surface like at [employer]  Relieving factors: prednisone, stretches that I was doing after hip replacement   At worst up to 7/10  PRECAUTIONS: None  WEIGHT BEARING RESTRICTIONS: No  FALLS:  Has patient fallen in last 6 months? No  LIVING ENVIRONMENT: Lives with: lives with their family Lives in: House/apartment Stairs: 5 STE no rails, flight of steps inside the home U rail Has following equipment at home: Single point cane and Environmental consultant - 2  wheeled  OCCUPATION: computer work- very sedentary job   PLOF: Independent, Independent with basic ADLs, Independent with gait, and Independent with transfers  PATIENT GOALS: be able to walk 4 miles for fitness, hike, be able to do yard work    NEXT MD VISIT: Referring PRN   OBJECTIVE:   DIAGNOSTIC FINDINGS:    PATIENT SURVEYS:  FOTO 51, predicted 76  SCREENING FOR RED FLAGS: Bowel or bladder incontinence: No Spinal tumors: No Cauda equina syndrome: No Compression fracture: No Abdominal aneurysm: No  COGNITION: Overall cognitive status: Within functional limits for tasks assessed     SENSATION: Not tested  MUSCLE LENGTH:  Quads and hip flexors mod limitation B  Hamstrings mod limitation R piriformis severe limitation    POSTURE: rounded shoulders, forward head, increased lumbar lordosis, and decreased lumbar lordosis  PALPATION:  Lumbar paraspinals tight but not TTP, no areas of soreness L glutes/piriformis or hamstrings   LUMBAR ROM:   AROM eval  Flexion WNL; RFIS "hard to explain it feels worse in the leg but back is better"  Extension WNL; REIS "back is more sore"   Right lateral flexion Severe limitation  Left lateral flexion Severe limitation   Right rotation Severe limitation   Left rotation Mild limitation    (Blank rows = not tested)   LOWER EXTREMITY MMT:    MMT Right eval Left eval  Hip flexion 5 5  Hip extension 4+ 4+  Hip abduction 5 4  Hip adduction    Hip internal rotation    Hip external rotation    Knee flexion 5 5  Knee extension 5 5  Ankle dorsiflexion 5 5  Ankle plantarflexion    Ankle inversion    Ankle eversion     (Blank rows = not tested)  LUMBAR SPECIAL TESTS:   No LLD noted     TODAY'S TREATMENT:                                                                                                                              DATE:  07/19/2022 Lumbar rotation stretch 10X 5 seconds  Posterior pelvic tilt (emphasis on  transversus abdominus activation) 10X 5 seconds Supine hamstrings stretch (other leg straight) 5X 20 seconds bilateral Standing lumbar extension AROM 10X 3 seconds Shoulder blade pinches 10X 5 seconds Prone alternating hip extension 2 sets of 10 3 seconds  Functional Activities: Reviewed spine anatomy with the spine model, discussed the importance of avoiding prolonged sitting, flexion and flexion with rotation.  Discussed home walking prescription of 20+ minutes 3 times per week.  Reviewed the importance of avoiding increases in peripheral symptoms with exercises and ADLs.  Discussed disc pressures in various positions, correct lumbar roll use and logroll technique for bed mobility.   Eval Objective measures  and appropriate education, POC, HEP   TherEx  Nustep L6 x6 minutes BLEs only PPT 5x5 second holds  Lumbar rotation stretch 5x5 seconds B Supine HS stretch 1x30 seconds B    PATIENT EDUCATION:  Education details: exam findings, HEP, POC  Person educated: Patient Education method: Explanation, Demonstration, and Handouts Education comprehension: verbalized understanding, returned demonstration, and needs further education  HOME EXERCISE PROGRAM: Access Code: [email or web address] URL: [email or web address] Date: 07/12/2022 Prepared by: Nedra Hai  Exercises - Supine Posterior Pelvic Tilt  - 2 x daily - 7 x weekly - 1 sets - 10-15 reps - 5 hold - Supine Lower Trunk Rotation  - 2 x daily - 7 x weekly - 1 sets - 10 reps - 5 hold - Hooklying Hamstring Stretch with Strap  - 2 x daily - 7 x weekly - 1 sets - 3 reps - 30 hold  ASSESSMENT:  CLINICAL IMPRESSION: Darriell reports good compliance with her day 1 home exercise program.  This was reviewed with minor modifications for comfort and additions were made for postural correction, postural and lumbar strengthening.  A lot of time today was spent on education so that Cecily realizes what postures and positions that she should  avoid to help facilitate meeting long-term goals.  Her prognosis remains good with the recommended plan of care.  OBJECTIVE IMPAIRMENTS: decreased mobility, difficulty walking, decreased ROM, decreased strength, impaired flexibility, postural dysfunction, and pain.   ACTIVITY LIMITATIONS: standing, squatting, sleeping, stairs, and locomotion level  PARTICIPATION LIMITATIONS: cleaning, laundry, driving, shopping, community activity, occupation, and yard work  PERSONAL FACTORS: Age, Behavior pattern, Education, Fitness, Past/current experiences, Sex, and Social background are also affecting patient's functional outcome.   REHAB POTENTIAL: Good  CLINICAL DECISION MAKING: Stable/uncomplicated  EVALUATION COMPLEXITY: Low   GOALS: Goals reviewed with patient? Yes  SHORT TERM GOALS: Target date: 08/02/2022    Will be compliant with appropriate progressive HEP  Baseline: Goal status: On Going 07/19/2022  2.  Pain to be no more than 5/10 at worst  Baseline:  Goal status: On Going 07/19/2022  3.  Will demonstrate improved awareness of functional posture with all daily and work based tasks  Baseline:  Goal status: On Going 07/19/2022  4.  All mm flexibility impairments to have resolved  Baseline:  Goal status: On Going 07/19/2022   LONG TERM GOALS: Target date: 08/23/2022    MMT to be 5/5 in all tested groups  Baseline:  Goal status: INITIAL  2.  Will be able to hold standard forearm plank for at least 30 seconds in order to demonstrate improved core strength  Baseline:  Goal status: INITIAL  3.  Pain to be no more than 3/10 at worst  Baseline:  Goal status: INITIAL  4.  Will demonstrate good floor to waist lifting mechanics  Baseline:  Goal  status: INITIAL  5.  Will have been able to return to regular walking and hiking routine without increase in pain  Baseline:  Goal status: INITIAL   PLAN:  PT FREQUENCY: 2x/week  PT DURATION: 6 weeks  PLANNED INTERVENTIONS:  Therapeutic exercises, Therapeutic activity, Neuromuscular re-education, Balance training, Gait training, Patient/Family education, Self Care, Joint mobilization, Stair training, Aquatic Therapy, Dry Needling, Spinal mobilization, Cryotherapy, Moist heat, Taping, Traction, Ultrasound, Ionotophoresis 4mg /ml Dexamethasone, Manual therapy, and Re-evaluation.  PLAN FOR NEXT SESSION: Low back and postural strengthening, postural training, biomechanics, lumbar ROM and mm flexibility   Rob W Bates [place] PT, MPT

## 2022-07-22 ENCOUNTER — Ambulatory Visit: Payer: No Typology Code available for payment source | Admitting: Physical Therapy

## 2022-07-22 ENCOUNTER — Encounter: Payer: Self-pay | Admitting: Physical Therapy

## 2022-07-22 DIAGNOSIS — M6281 Muscle weakness (generalized): Secondary | ICD-10-CM | POA: Diagnosis not present

## 2022-07-22 DIAGNOSIS — M5459 Other low back pain: Secondary | ICD-10-CM

## 2022-07-22 DIAGNOSIS — R29898 Other symptoms and signs involving the musculoskeletal system: Secondary | ICD-10-CM

## 2022-07-22 NOTE — Therapy (Signed)
OUTPATIENT PHYSICAL THERAPY THORACOLUMBAR TREATMENT   Patient Name: [name] MRN: [id number] DOB:07-27-1965, 57 y.o., female Today's Date: 07/22/2022  END OF SESSION:  PT End of Session - 07/22/22 0946     Visit Number 3    Number of Visits 13    Date for PT Re-Evaluation 08/23/22    Authorization Type [employer]et[employer]    Authorization Time Period 07/12/22 to 08/23/22    PT Start Time 0934    PT Stop Time 1012    PT Time Calculation (min) 38 min    Activity Tolerance Patient tolerated treatment well;No increased pain    Behavior During Therapy WFL for tasks assessed/performed               Past Medical History:  Diagnosis Date   Generalized headaches    GERD (gastroesophageal reflux disease)    Hypothyroidism    Thyroid disease    hypo   Past Surgical History:  Procedure Laterality Date   ABDOMINAL HYSTERECTOMY N/A    [facility] 08/30/2019   APPENDECTOMY  2003   CESAREAN SECTION  2005   CESAREAN SECTION N/A    [employer] 08/30/2019   CHOLECYSTECTOMY     DIAGNOSTIC LAPAROSCOPY     Lap chole.   ROBOTIC ASSISTED TOTAL HYSTERECTOMY Bilateral 09/29/2012   Procedure: ROBOTIC ASSISTED TOTAL HYSTERECTOMY WITH BILATERAL SALPINGECTOMY;  Surgeon: Serita Kyle, MD;  Location: [facility];  Service: Gynecology;  Laterality: Bilateral;  3 hrs.   TONSILLECTOMY     TONSILLECTOMY  1969   TOTAL HIP ARTHROPLASTY Left 08/21/2020   Procedure: LEFT TOTAL HIP ARTHROPLASTY ANTERIOR APPROACH;  Surgeon: Tarry Kos, MD;  Location: [facility];  Service: Orthopedics;  Laterality: Left;  3-C   Patient Active Problem List   Diagnosis Date Noted   BMI 31.0-31.9,adult 04/21/2022   Prediabetes 08/08/2021   Mixed hyperlipidemia 08/08/2021   Lentigo 05/21/2021   Melanocytic nevi of right upper limb, including shoulder 05/21/2021   Melanocytic nevi of trunk 05/21/2021   Neoplasm of uncertain behavior of skin 05/21/2021   Other skin changes due to chronic exposure to nonionizing radiation 05/21/2021    Other seborrheic keratosis 05/21/2021   Skin tag 05/21/2021   Status post total replacement of left hip 08/21/2020   Vitamin D deficiency 07/13/2020   Pure hypercholesterolemia 07/13/2020   Gastroesophageal reflux disease 07/13/2020   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   Allergic rhinitis due to pollen 07/13/2020   Pain of right heel 07/07/2020   Primary osteoarthritis of left hip 04/25/2020   Hypothyroidism 09/30/2019   Heartburn 09/30/2019   Left hip pain 08/06/2017   Cholelithiases 10/11/2010    PCP: Melida Quitter, PA  REFERRING PROVIDER: Melida Quitter, PA  REFERRING DIAG:  M54.16 (ICD-10-CM) - Lumbar back pain with radiculopathy affecting left lower extremity    Rationale for Evaluation and Treatment: Rehabilitation  THERAPY DIAG:  Other low back pain  Muscle weakness (generalized)  Other symptoms and signs involving the musculoskeletal system  ONSET DATE: 07/05/2022  SUBJECTIVE:  SUBJECTIVE STATEMENT: Feeling much better, I've been walking more. Maybe its been a combination of exercises not sure. Little stiff from doing stuff yesterday, like push mowing.   PERTINENT HISTORY:    PAIN:  Are you having pain? Yes: NPRS scale: 4/10 Pain location: back and back of knee but not shooting down leg   Pain description: dull  Aggravating factors: bending and exerting to open a stuck window. Twisting, sitting for too long, not being mindful of posture  Relieving factors: stretching, remembering to do stretches and HEP, activity level, "being mindful of not doing stupid things"    At worst up to 7/10  PRECAUTIONS: None  WEIGHT BEARING RESTRICTIONS: No  FALLS:  Has patient fallen in last 6 months? No  LIVING ENVIRONMENT: Lives with: lives with their family Lives in:  House/apartment Stairs: 5 STE no rails, flight of steps inside the home U rail Has following equipment at home: Single point cane and Environmental consultant - 2 wheeled  OCCUPATION: computer work- very sedentary job   PLOF: Independent, Independent with basic ADLs, Independent with gait, and Independent with transfers  PATIENT GOALS: be able to walk 4 miles for fitness, hike, be able to do yard work    NEXT MD VISIT: Referring PRN   OBJECTIVE:   DIAGNOSTIC FINDINGS:    PATIENT SURVEYS:  FOTO 51, predicted 64  SCREENING FOR RED FLAGS: Bowel or bladder incontinence: No Spinal tumors: No Cauda equina syndrome: No Compression fracture: No Abdominal aneurysm: No  COGNITION: Overall cognitive status: Within functional limits for tasks assessed     SENSATION: Not tested  MUSCLE LENGTH:  Quads and hip flexors mod limitation B  Hamstrings mod limitation R piriformis severe limitation    POSTURE: rounded shoulders, forward head, increased lumbar lordosis, and decreased lumbar lordosis  PALPATION:  Lumbar paraspinals tight but not TTP, no areas of soreness L glutes/piriformis or hamstrings   LUMBAR ROM:   AROM eval  Flexion WNL; RFIS "hard to explain it feels worse in the leg but back is better"  Extension WNL; REIS "back is more sore"   Right lateral flexion Severe limitation  Left lateral flexion Severe limitation   Right rotation Severe limitation   Left rotation Mild limitation    (Blank rows = not tested)   LOWER EXTREMITY MMT:    MMT Right eval Left eval  Hip flexion 5 5  Hip extension 4+ 4+  Hip abduction 5 4  Hip adduction    Hip internal rotation    Hip external rotation    Knee flexion 5 5  Knee extension 5 5  Ankle dorsiflexion 5 5  Ankle plantarflexion    Ankle inversion    Ankle eversion     (Blank rows = not tested)  LUMBAR SPECIAL TESTS:   No LLD noted     TODAY'S TREATMENT:  DATE:    07/22/22  TherEx  Lumbar rotation stretch 5x5 second holds PPT x20 with 5 second holds Bridges + ABD into red TB x15 with 3 second holds Sidelying clams red TB x12 B PPT + march x12 B PPT + opposite/alternating UE/LE flexion x12 B  Wiggle worms x12 B for oblique activation Curl ups (shoulder blades off mat table only) x15 Nustep L6x6 minutes BLEs only Lumbar AROM progression against wall x10 with 3 second holds Lateral hip excursions x10 B with 3 second holds TA set + marches x10 B (standing)     07/19/2022 Lumbar rotation stretch 10X 5 seconds Posterior pelvic tilt (emphasis on transversus abdominus activation) 10X 5 seconds Supine hamstrings stretch (other leg straight) 5X 20 seconds bilateral Standing lumbar extension AROM 10X 3 seconds Shoulder blade pinches 10X 5 seconds Prone alternating hip extension 2 sets of 10 3 seconds  Functional Activities: Reviewed spine anatomy with the spine model, discussed the importance of avoiding prolonged sitting, flexion and flexion with rotation.  Discussed home walking prescription of 20+ minutes 3 times per week.  Reviewed the importance of avoiding increases in peripheral symptoms with exercises and ADLs.  Discussed disc pressures in various positions, correct lumbar roll use and logroll technique for bed mobility.   Eval Objective measures  and appropriate education, POC, HEP   TherEx  Nustep L6 x6 minutes BLEs only PPT 5x5 second holds  Lumbar rotation stretch 5x5 seconds B Supine HS stretch 1x30 seconds B    PATIENT EDUCATION:  Education details: exam findings, HEP, POC  Person educated: Patient Education method: Explanation, Demonstration, and Handouts Education comprehension: verbalized understanding, returned demonstration, and needs further education  HOME EXERCISE PROGRAM: Access Code: [email or web address] URL: [email or web address] Date:  07/12/2022 Prepared by: Nedra Hai  Exercises - Supine Posterior Pelvic Tilt  - 2 x daily - 7 x weekly - 1 sets - 10-15 reps - 5 hold - Supine Lower Trunk Rotation  - 2 x daily - 7 x weekly - 1 sets - 10 reps - 5 hold - Hooklying Hamstring Stretch with Strap  - 2 x daily - 7 x weekly - 1 sets - 3 reps - 30 hold  ASSESSMENT:  CLINICAL IMPRESSION:  [name] arrives today doing OK, we continued focus on general lumbar mobility as well as postural training and core strengthening. Really still needs a lot of cues due to poor kinesthetic awareness. Will continue POC, seems to be making progress.  OBJECTIVE IMPAIRMENTS: decreased mobility, difficulty walking, decreased ROM, decreased strength, impaired flexibility, postural dysfunction, and pain.   ACTIVITY LIMITATIONS: standing, squatting, sleeping, stairs, and locomotion level  PARTICIPATION LIMITATIONS: cleaning, laundry, driving, shopping, community activity, occupation, and yard work  PERSONAL FACTORS: Age, Behavior pattern, Education, Fitness, Past/current experiences, Sex, and Social background are also affecting patient's functional outcome.   REHAB POTENTIAL: Good  CLINICAL DECISION MAKING: Stable/uncomplicated  EVALUATION COMPLEXITY: Low   GOALS: Goals reviewed with patient? Yes  SHORT TERM GOALS: Target date: 08/02/2022    Will be compliant with appropriate progressive HEP  Baseline: Goal status: On Going 07/19/2022  2.  Pain to be no more than 5/10 at worst  Baseline:  Goal status: On Going 07/19/2022  3.  Will demonstrate improved awareness of functional posture with all daily and work based tasks  Baseline:  Goal status: On Going 07/19/2022  4.  All mm flexibility impairments to have resolved  Baseline:  Goal status: On Going 07/19/2022   LONG TERM GOALS: Target date: 08/23/2022  MMT to be 5/5 in all tested groups  Baseline:  Goal status: INITIAL  2.  Will be able to hold standard forearm plank for at  least 30 seconds in order to demonstrate improved core strength  Baseline:  Goal status: INITIAL  3.  Pain to be no more than 3/10 at worst  Baseline:  Goal status: INITIAL  4.  Will demonstrate good floor to waist lifting mechanics  Baseline:  Goal status: INITIAL  5.  Will have been able to return to regular walking and hiking routine without increase in pain  Baseline:  Goal status: INITIAL   PLAN:  PT FREQUENCY: 2x/week  PT DURATION: 6 weeks  PLANNED INTERVENTIONS: Therapeutic exercises, Therapeutic activity, Neuromuscular re-education, Balance training, Gait training, Patient/Family education, Self Care, Joint mobilization, Stair training, Aquatic Therapy, Dry Needling, Spinal mobilization, Cryotherapy, Moist heat, Taping, Traction, Ultrasound, Ionotophoresis 4mg /ml Dexamethasone, Manual therapy, and Re-evaluation.  PLAN FOR NEXT SESSION: Low back and postural strengthening, postural training, biomechanics, lumbar ROM and mm flexibility; progressions as tolerated. Update HEP next visit   Nedra Hai PT DPT PN2

## 2022-07-26 ENCOUNTER — Ambulatory Visit: Payer: No Typology Code available for payment source | Admitting: Physical Therapy

## 2022-07-26 ENCOUNTER — Encounter: Payer: Self-pay | Admitting: Physical Therapy

## 2022-07-26 DIAGNOSIS — R29898 Other symptoms and signs involving the musculoskeletal system: Secondary | ICD-10-CM

## 2022-07-26 DIAGNOSIS — R262 Difficulty in walking, not elsewhere classified: Secondary | ICD-10-CM

## 2022-07-26 DIAGNOSIS — M5459 Other low back pain: Secondary | ICD-10-CM | POA: Diagnosis not present

## 2022-07-26 DIAGNOSIS — M6281 Muscle weakness (generalized): Secondary | ICD-10-CM

## 2022-07-26 NOTE — Therapy (Signed)
OUTPATIENT PHYSICAL THERAPY THORACOLUMBAR TREATMENT   Patient Name: [name] MRN: [id number] DOB:[birth year], 57 y.o., female Today's Date: 07/26/2022  END OF SESSION:  PT End of Session - 07/26/22 1557     Visit Number 4    Number of Visits 13    Date for PT Re-Evaluation 08/23/22    Authorization Type [employer]    Authorization Time Period 07/12/22 to 08/23/22    PT Start Time 1517    PT Stop Time 1556    PT Time Calculation (min) 39 min    Activity Tolerance Patient tolerated treatment well;No increased pain    Behavior During Therapy WFL for tasks assessed/performed                Past Medical History:  Diagnosis Date   Generalized headaches    GERD (gastroesophageal reflux disease)    Hypothyroidism    Thyroid disease    hypo   Past Surgical History:  Procedure Laterality Date   ABDOMINAL HYSTERECTOMY N/A    [employer] 08/30/2019   APPENDECTOMY  2003   CESAREAN SECTION  2005   CESAREAN SECTION N/A    [employer] 08/30/2019   CHOLECYSTECTOMY     DIAGNOSTIC LAPAROSCOPY     Lap chole.   ROBOTIC ASSISTED TOTAL HYSTERECTOMY Bilateral 09/29/2012   Procedure: ROBOTIC ASSISTED TOTAL HYSTERECTOMY WITH BILATERAL SALPINGECTOMY;  Surgeon: Serita Kyle, MD;  Location: [facility];  Service: Gynecology;  Laterality: Bilateral;  3 hrs.   TONSILLECTOMY     TONSILLECTOMY  1969   TOTAL HIP ARTHROPLASTY Left 08/21/2020   Procedure: LEFT TOTAL HIP ARTHROPLASTY ANTERIOR APPROACH;  Surgeon: Tarry Kos, MD;  Location: [facility];  Service: Orthopedics;  Laterality: Left;  3-C   Patient Active Problem List   Diagnosis Date Noted   BMI 31.0-31.9,adult 04/21/2022   Prediabetes 08/08/2021   Mixed hyperlipidemia 08/08/2021   Lentigo 05/21/2021   Melanocytic nevi of right upper limb, including shoulder 05/21/2021   Melanocytic nevi of trunk 05/21/2021   Neoplasm of uncertain behavior of skin 05/21/2021   Other skin changes due to chronic exposure to nonionizing radiation 05/21/2021    Other seborrheic keratosis 05/21/2021   Skin tag 05/21/2021   Status post total replacement of left hip 08/21/2020   Vitamin D deficiency 07/13/2020   Pure hypercholesterolemia 07/13/2020   Gastroesophageal reflux disease 07/13/2020   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   Allergic rhinitis due to pollen 07/13/2020   Pain of right heel 07/07/2020   Primary osteoarthritis of left hip 04/25/2020   Hypothyroidism 09/30/2019   Heartburn 09/30/2019   Left hip pain 08/06/2017   Cholelithiases 10/11/2010    PCP: Melida Quitter, PA  REFERRING PROVIDER: Melida Quitter, PA  REFERRING DIAG:  M54.16 (ICD-10-CM) - Lumbar back pain with radiculopathy affecting left lower extremity    Rationale for Evaluation and Treatment: Rehabilitation  THERAPY DIAG:  Other low back pain  Muscle weakness (generalized)  Difficulty in walking, not elsewhere classified  Other symptoms and signs involving the musculoskeletal system  ONSET DATE: 07/05/2022  SUBJECTIVE:  SUBJECTIVE STATEMENT:  My back feels better but my knee feels weird, its popping quite a bit and it feels like there's fluid behind it when I try to bend it. Sitting too long tends to make the knee feel off. Pain doesn't feel like it did before, haven't had sciatic pain since being in PT  PERTINENT HISTORY:    PAIN:  Are you having pain? Yes: NPRS scale: 4/10 Pain location: back and back of knee but not shooting down leg   Pain description: dull  Aggravating factors: bending and exerting to open a stuck window. Twisting, sitting for too long, not being mindful of posture  Relieving factors: stretching, remembering to do stretches and HEP, activity level, "being mindful of not doing stupid things"    At worst up to 7/10  PRECAUTIONS:  None  WEIGHT BEARING RESTRICTIONS: No  FALLS:  Has patient fallen in last 6 months? No  LIVING ENVIRONMENT: Lives with: lives with their family Lives in: House/apartment Stairs: 5 STE no rails, flight of steps inside the home U rail Has following equipment at home: Single point cane and Environmental consultant - 2 wheeled  OCCUPATION: computer work- very sedentary job   PLOF: Independent, Independent with basic ADLs, Independent with gait, and Independent with transfers  PATIENT GOALS: be able to walk 4 miles for fitness, hike, be able to do yard work    NEXT MD VISIT: Referring PRN   OBJECTIVE:   DIAGNOSTIC FINDINGS:    PATIENT SURVEYS:  FOTO 51, predicted 15  SCREENING FOR RED FLAGS: Bowel or bladder incontinence: No Spinal tumors: No Cauda equina syndrome: No Compression fracture: No Abdominal aneurysm: No  COGNITION: Overall cognitive status: Within functional limits for tasks assessed     SENSATION: Not tested  MUSCLE LENGTH:  Quads and hip flexors mod limitation B  Hamstrings mod limitation R piriformis severe limitation    POSTURE: rounded shoulders, forward head, increased lumbar lordosis, and decreased lumbar lordosis  PALPATION:  Lumbar paraspinals tight but not TTP, no areas of soreness L glutes/piriformis or hamstrings   LUMBAR ROM:   AROM eval  Flexion WNL; RFIS "hard to explain it feels worse in the leg but back is better"  Extension WNL; REIS "back is more sore"   Right lateral flexion Severe limitation  Left lateral flexion Severe limitation   Right rotation Severe limitation   Left rotation Mild limitation    (Blank rows = not tested)   LOWER EXTREMITY MMT:    MMT Right eval Left eval  Hip flexion 5 5  Hip extension 4+ 4+  Hip abduction 5 4  Hip adduction    Hip internal rotation    Hip external rotation    Knee flexion 5 5  Knee extension 5 5  Ankle dorsiflexion 5 5  Ankle plantarflexion    Ankle inversion    Ankle eversion      (Blank rows = not tested)  LUMBAR SPECIAL TESTS:   No LLD noted     TODAY'S TREATMENT:  DATE:   07/26/22  TherEx  Nustep L6x6 minutes BLEs only  Gastroc stretch L LE 3x30 seconds  Shuttle LE press: DL 161# [id number], SL 45# [id number] B Prone opposite UE/LE raises 2x10 B Quadruped TA sets x15 with 3 second holds  Quadruped lumbar "sags" for extension x15 Quadruped hip ABD + TA set x10 B Quadruped hip extension + TA set x10  B Marches + TA set standing x10 B  Hip hikes x12 B    07/22/22  TherEx  Lumbar rotation stretch 5x5 second holds PPT x20 with 5 second holds Bridges + ABD into red TB x15 with 3 second holds Sidelying clams red TB x12 B PPT + march x12 B PPT + opposite/alternating UE/LE flexion x12 B  Wiggle worms x12 B for oblique activation Curl ups (shoulder blades off mat table only) x15 Nustep L6x6 minutes BLEs only Lumbar AROM progression against wall x10 with 3 second holds Lateral hip excursions x10 B with 3 second holds TA set + marches x10 B (standing)     07/19/2022 Lumbar rotation stretch 10X 5 seconds Posterior pelvic tilt (emphasis on transversus abdominus activation) 10X 5 seconds Supine hamstrings stretch (other leg straight) 5X 20 seconds bilateral Standing lumbar extension AROM 10X 3 seconds Shoulder blade pinches 10X 5 seconds Prone alternating hip extension 2 sets of 10 3 seconds  Functional Activities: Reviewed spine anatomy with the spine model, discussed the importance of avoiding prolonged sitting, flexion and flexion with rotation.  Discussed home walking prescription of 20+ minutes 3 times per week.  Reviewed the importance of avoiding increases in peripheral symptoms with exercises and ADLs.  Discussed disc pressures in various positions, correct lumbar roll use and logroll technique for bed  mobility.   Eval Objective measures  and appropriate education, POC, HEP   TherEx  Nustep L6 x6 minutes BLEs only PPT 5x5 second holds  Lumbar rotation stretch 5x5 seconds B Supine HS stretch 1x30 seconds B    PATIENT EDUCATION:  Education details: exam findings, HEP, POC  Person educated: Patient Education method: Explanation, Demonstration, and Handouts Education comprehension: verbalized understanding, returned demonstration, and needs further education  HOME EXERCISE PROGRAM:  Access Code: [email or web address] URL: [email or web address] Date: 07/26/2022 Prepared by: Nedra Hai  Exercises - Supine Posterior Pelvic Tilt  - 2 x daily - 7 x weekly - 1 sets - 10-15 reps - 5 hold - Supine Lower Trunk Rotation  - 2 x daily - 7 x weekly - 1 sets - 10 reps - 5 hold - Hooklying Hamstring Stretch with Strap  - 2 x daily - 7 x weekly - 1 sets - 4-5 reps - 10-30 hold - Standing Lumbar Extension at Wall - Forearms  - 5 x daily - 7 x weekly - 1 sets - 5 reps - 3 seconds hold - Standing Scapular Retraction  - 5 x daily - 7 x weekly - 1 sets - 5 reps - 5 second hold - Prone Hip Extension  - 1 x daily - 7 x weekly - 2-3 sets - 10 reps - 3 seconds hold - Gastroc Stretch with Foot at Wall  - 2 x daily - 7 x weekly - 1 sets - 3 reps - 30 hold - Seated Figure 4 Piriformis Stretch  - 2 x daily - 7 x weekly - 1 sets - 3 reps - 30 hold - Prone Alternating Arm and Leg Lifts  - 1 x daily - 7 x weekly - 3 sets - 10 reps  ASSESSMENT:  CLINICAL IMPRESSION:  Areyana arrives today doing OK, back is doing better but she has been having some knee pain that worsens especially when she is sitting for a long period of time. Continued working on core strength and extension based exercises for lumbar spine, also worked on some LE flexibility to see if this addressed posterior knee pain. Will continue efforts.   OBJECTIVE IMPAIRMENTS: decreased mobility, difficulty walking, decreased ROM, decreased  strength, impaired flexibility, postural dysfunction, and pain.   ACTIVITY LIMITATIONS: standing, squatting, sleeping, stairs, and locomotion level  PARTICIPATION LIMITATIONS: cleaning, laundry, driving, shopping, community activity, occupation, and yard work  PERSONAL FACTORS: Age, Behavior pattern, Education, Fitness, Past/current experiences, Sex, and Social background are also affecting patient's functional outcome.   REHAB POTENTIAL: Good  CLINICAL DECISION MAKING: Stable/uncomplicated  EVALUATION COMPLEXITY: Low   GOALS: Goals reviewed with patient? Yes  SHORT TERM GOALS: Target date: 08/02/2022    Will be compliant with appropriate progressive HEP  Baseline: Goal status: On Going 07/19/2022  2.  Pain to be no more than 5/10 at worst  Baseline:  Goal status: On Going 07/19/2022  3.  Will demonstrate improved awareness of functional posture with all daily and work based tasks  Baseline:  Goal status: On Going 07/19/2022  4.  All mm flexibility impairments to have resolved  Baseline:  Goal status: On Going 07/19/2022   LONG TERM GOALS: Target date: 08/23/2022    MMT to be 5/5 in all tested groups  Baseline:  Goal status: INITIAL  2.  Will be able to hold standard forearm plank for at least 30 seconds in order to demonstrate improved core strength  Baseline:  Goal status: INITIAL  3.  Pain to be no more than 3/10 at worst  Baseline:  Goal status: INITIAL  4.  Will demonstrate good floor to waist lifting mechanics  Baseline:  Goal status: INITIAL  5.  Will have been able to return to regular walking and hiking routine without increase in pain  Baseline:  Goal status: INITIAL   PLAN:  PT FREQUENCY: 2x/week  PT DURATION: 6 weeks  PLANNED INTERVENTIONS: Therapeutic exercises, Therapeutic activity, Neuromuscular re-education, Balance training, Gait training, Patient/Family education, Self Care, Joint mobilization, Stair training, Aquatic Therapy, Dry  Needling, Spinal mobilization, Cryotherapy, Moist heat, Taping, Traction, Ultrasound, Ionotophoresis 4mg /ml Dexamethasone, Manual therapy, and Re-evaluation.  PLAN FOR NEXT SESSION: Low back and postural strengthening, postural training, biomechanics, lumbar ROM and mm flexibility; progressions as tolerated.  Nedra Hai PT DPT PN2

## 2022-08-02 ENCOUNTER — Encounter: Payer: Self-pay | Admitting: Physical Therapy

## 2022-08-02 ENCOUNTER — Ambulatory Visit: Payer: No Typology Code available for payment source | Admitting: Physical Therapy

## 2022-08-02 DIAGNOSIS — R262 Difficulty in walking, not elsewhere classified: Secondary | ICD-10-CM | POA: Diagnosis not present

## 2022-08-02 DIAGNOSIS — M5459 Other low back pain: Secondary | ICD-10-CM | POA: Diagnosis not present

## 2022-08-02 DIAGNOSIS — M6281 Muscle weakness (generalized): Secondary | ICD-10-CM

## 2022-08-02 DIAGNOSIS — R29898 Other symptoms and signs involving the musculoskeletal system: Secondary | ICD-10-CM

## 2022-08-02 NOTE — Therapy (Signed)
OUTPATIENT PHYSICAL THERAPY THORACOLUMBAR TREATMENT   Patient Name: [name] MRN: [id number] DOB:1965-12-12, 57 y.o., female Today's Date: 08/02/2022  END OF SESSION:  PT End of Session - 08/02/22 1553     Visit Number 5    Number of Visits 13    Date for PT Re-Evaluation 08/23/22    Authorization Type [employer]    Authorization Time Period 07/12/22 to 08/23/22    PT Start Time 1513    PT Stop Time 1545   goals of session met, no need for additional skilled services today   PT Time Calculation (min) 32 min    Activity Tolerance Patient tolerated treatment well    Behavior During Therapy [facility] for tasks assessed/performed                 Past Medical History:  Diagnosis Date   Generalized headaches    GERD (gastroesophageal reflux disease)    Hypothyroidism    Thyroid disease    hypo   Past Surgical History:  Procedure Laterality Date   ABDOMINAL HYSTERECTOMY N/A    Phreesia 08/30/2019   APPENDECTOMY  2003   CESAREAN SECTION  2005   CESAREAN SECTION N/A    Phreesia 08/30/2019   CHOLECYSTECTOMY     DIAGNOSTIC LAPAROSCOPY     Lap chole.   ROBOTIC ASSISTED TOTAL HYSTERECTOMY Bilateral 09/29/2012   Procedure: ROBOTIC ASSISTED TOTAL HYSTERECTOMY WITH BILATERAL SALPINGECTOMY;  Surgeon: Serita Kyle, MD;  Location: [facility];  Service: Gynecology;  Laterality: Bilateral;  3 hrs.   TONSILLECTOMY     TONSILLECTOMY  1969   TOTAL HIP ARTHROPLASTY Left 08/21/2020   Procedure: LEFT TOTAL HIP ARTHROPLASTY ANTERIOR APPROACH;  Surgeon: Tarry Kos, MD;  Location: [facility];  Service: Orthopedics;  Laterality: Left;  3-C   Patient Active Problem List   Diagnosis Date Noted   BMI 31.0-31.9,adult 04/21/2022   Prediabetes 08/08/2021   Mixed hyperlipidemia 08/08/2021   Lentigo 05/21/2021   Melanocytic nevi of right upper limb, including shoulder 05/21/2021   Melanocytic nevi of trunk 05/21/2021   Neoplasm of uncertain behavior of skin 05/21/2021   Other skin changes due to  chronic exposure to nonionizing radiation 05/21/2021   Other seborrheic keratosis 05/21/2021   Skin tag 05/21/2021   Status post total replacement of left hip 08/21/2020   Vitamin D deficiency 07/13/2020   Pure hypercholesterolemia 07/13/2020   Gastroesophageal reflux disease 07/13/2020   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   Allergic rhinitis due to pollen 07/13/2020   Pain of right heel 07/07/2020   Primary osteoarthritis of left hip 04/25/2020   Hypothyroidism 09/30/2019   Heartburn 09/30/2019   Left hip pain 08/06/2017   Cholelithiases 10/11/2010    PCP: Melida Quitter, PA  REFERRING PROVIDER: Melida Quitter, PA  REFERRING DIAG:  M54.16 (ICD-10-CM) - Lumbar back pain with radiculopathy affecting left lower extremity    Rationale for Evaluation and Treatment: Rehabilitation  THERAPY DIAG:  Other low back pain  Muscle weakness (generalized)  Difficulty in walking, not elsewhere classified  Other symptoms and signs involving the musculoskeletal system  ONSET DATE: 07/05/2022  SUBJECTIVE:  SUBJECTIVE STATEMENT:  I'm still concerned about my knee, back is better but it still feels like there's fluid or something behind the knee. Gets better if I elevated my leg. Having a twinge in the back every so often, but doing better in terms of posture and getting up and moving regularly. By itself, the back is probably like 80/100, feels a little tender if I bend over to pick up something but other than its OK, doesn't stop me from doing anything. Wondering if it would be better to go on hold until after I see MD for my knee.  PERTINENT HISTORY:    PAIN:  Are you having pain? Yes: NPRS scale: 2/10 Pain location: L knee    Pain description: discomfort  Aggravating factors: hard to  predict, too much repeated activity on knee  Relieving factors: unsure     PRECAUTIONS: None  WEIGHT BEARING RESTRICTIONS: No  FALLS:  Has patient fallen in last 6 months? No  LIVING ENVIRONMENT: Lives with: lives with their family Lives in: House/apartment Stairs: 5 STE no rails, flight of steps inside the home U rail Has following equipment at home: Single point cane and Environmental consultant - 2 wheeled  OCCUPATION: computer work- very sedentary job   PLOF: Independent, Independent with basic ADLs, Independent with gait, and Independent with transfers  PATIENT GOALS: be able to walk 4 miles for fitness, hike, be able to do yard work    NEXT MD VISIT: Referring PRN   OBJECTIVE:   DIAGNOSTIC FINDINGS:    PATIENT SURVEYS:  FOTO 51, predicted 41  SCREENING FOR RED FLAGS: Bowel or bladder incontinence: No Spinal tumors: No Cauda equina syndrome: No Compression fracture: No Abdominal aneurysm: No  COGNITION: Overall cognitive status: Within functional limits for tasks assessed     SENSATION: Not tested  MUSCLE LENGTH:  Quads and hip flexors mod limitation B; 5/24- hip flexors mod limit B, R quad mild limit/L quad severe limit due to posterior knee pain  Hamstrings mod limitation; 5/24 mild limitation B  R piriformis severe limitation; 5/24 moderate limitation B piriformis    POSTURE: rounded shoulders, forward head, increased lumbar lordosis, and decreased lumbar lordosis  PALPATION:  Lumbar paraspinals tight but not TTP, no areas of soreness L glutes/piriformis or hamstrings   LUMBAR ROM:   AROM eval 08/02/22  Flexion WNL; RFIS "hard to explain it feels worse in the leg but back is better" WNL   Extension WNL; REIS "back is more sore"  WNL   Right lateral flexion Severe limitation Mild limitation   Left lateral flexion Severe limitation  Mild limitation  Right rotation Severe limitation  Mild limitation B   Left rotation Mild limitation  Mild limitation B    (Blank  rows = not tested)   LOWER EXTREMITY MMT:    MMT Right eval Left eval 08/02/22 Right  08/02/22 Left   Hip flexion 5 5 5 5   Hip extension 4+ 4+ 5 5  Hip abduction 5 4 5 4   Hip adduction      Hip internal rotation      Hip external rotation      Knee flexion 5 5 5 5   Knee extension 5 5 5 5   Ankle dorsiflexion 5 5 5 5   Ankle plantarflexion      Ankle inversion      Ankle eversion       (Blank rows = not tested)  LUMBAR SPECIAL TESTS:   No LLD noted   5/24- poor  functional lifting mechanics, needed Mod cues pain L knee     TODAY'S TREATMENT:                                                                                                                              DATE:   08/02/22  Objective measures/FOTO/education on POC   FOTO 60 in 5 visits   TherEx  Scifit bike x6 minutes L3  Selfcare  POC, progress towards goals and function in general, need to work on more advanced functional strengthening and lifting but PT concern that this may aggravate her knee and then lead to poor mechanics that would re-aggravate the back, continue with HEP and ice on L knee. On hold until after she sees Dr. Roda Shutters for her knee    07/26/22  TherEx  Nustep L6x6 minutes BLEs only  Gastroc stretch L LE 3x30 seconds  Shuttle LE press: DL 161# [id number], SL 45# [id number] B Prone opposite UE/LE raises 2x10 B Quadruped TA sets x15 with 3 second holds  Quadruped lumbar "sags" for extension x15 Quadruped hip ABD + TA set x10 B Quadruped hip extension + TA set x10  B Marches + TA set standing x10 B  Hip hikes x12 B    07/22/22  TherEx  Lumbar rotation stretch 5x5 second holds PPT x20 with 5 second holds Bridges + ABD into red TB x15 with 3 second holds Sidelying clams red TB x12 B PPT + march x12 B PPT + opposite/alternating UE/LE flexion x12 B  Wiggle worms x12 B for oblique activation Curl ups (shoulder blades off mat table only) x15 Nustep L6x6 minutes BLEs only Lumbar AROM progression  against wall x10 with 3 second holds Lateral hip excursions x10 B with 3 second holds TA set + marches x10 B (standing)     07/19/2022 Lumbar rotation stretch 10X 5 seconds Posterior pelvic tilt (emphasis on transversus abdominus activation) 10X 5 seconds Supine hamstrings stretch (other leg straight) 5X 20 seconds bilateral Standing lumbar extension AROM 10X 3 seconds Shoulder blade pinches 10X 5 seconds Prone alternating hip extension 2 sets of 10 3 seconds  Functional Activities: Reviewed spine anatomy with the spine model, discussed the importance of avoiding prolonged sitting, flexion and flexion with rotation.  Discussed home walking prescription of 20+ minutes 3 times per week.  Reviewed the importance of avoiding increases in peripheral symptoms with exercises and ADLs.  Discussed disc pressures in various positions, correct lumbar roll use and logroll technique for bed mobility.   Eval Objective measures  and appropriate education, POC, HEP   TherEx  Nustep L6 x6 minutes BLEs only PPT 5x5 second holds  Lumbar rotation stretch 5x5 seconds B Supine HS stretch 1x30 seconds B    PATIENT EDUCATION:  Education details: exam findings, HEP, POC  Person educated: Patient Education method: Explanation, Demonstration, and Handouts Education comprehension: verbalized understanding, returned demonstration, and needs further education  HOME EXERCISE PROGRAM:  Access Code: [email or web address]  URL: [email or web address] Date: 07/26/2022 Prepared by: Nedra Hai  Exercises - Supine Posterior Pelvic Tilt  - 2 x daily - 7 x weekly - 1 sets - 10-15 reps - 5 hold - Supine Lower Trunk Rotation  - 2 x daily - 7 x weekly - 1 sets - 10 reps - 5 hold - Hooklying Hamstring Stretch with Strap  - 2 x daily - 7 x weekly - 1 sets - 4-5 reps - 10-30 hold - Standing Lumbar Extension at Wall - Forearms  - 5 x daily - 7 x weekly - 1 sets - 5 reps - 3 seconds hold - Standing Scapular  Retraction  - 5 x daily - 7 x weekly - 1 sets - 5 reps - 5 second hold - Prone Hip Extension  - 1 x daily - 7 x weekly - 2-3 sets - 10 reps - 3 seconds hold - Gastroc Stretch with Foot at Wall  - 2 x daily - 7 x weekly - 1 sets - 3 reps - 30 hold - Seated Figure 4 Piriformis Stretch  - 2 x daily - 7 x weekly - 1 sets - 3 reps - 30 hold - Prone Alternating Arm and Leg Lifts  - 1 x daily - 7 x weekly - 3 sets - 10 reps  ASSESSMENT:  CLINICAL IMPRESSION:  Alliemae arrives today doing OK, back is at about 80% but her knee is starting to become more of an issue. Got objective measures and checked all STG and LTGs as well as FOTO. HEP is already fairly thorough and she has had success with in in managing back pain with it. I think going on hold until Dr Roda Shutters is able to assess her knee is a reasonable plan- may be a bit hard to progress exercises given knee pain without significant compensations from knee pain, which might then re-irritate her back. All objective measures agree that back problems/complaints are largely resolved, and remaining concerns seem to be primarily related to knee. We will go on hold for now, would really like to see her back for 3 more sessions to work more on advanced strengthening and lifting mechanics when her knee is feeling better prior to DC.   OBJECTIVE IMPAIRMENTS: decreased mobility, difficulty walking, decreased ROM, decreased strength, impaired flexibility, postural dysfunction, and pain.   ACTIVITY LIMITATIONS: standing, squatting, sleeping, stairs, and locomotion level  PARTICIPATION LIMITATIONS: cleaning, laundry, driving, shopping, community activity, occupation, and yard work  PERSONAL FACTORS: Age, Behavior pattern, Education, Fitness, Past/current experiences, Sex, and Social background are also affecting patient's functional outcome.   REHAB POTENTIAL: Good  CLINICAL DECISION MAKING: Stable/uncomplicated  EVALUATION COMPLEXITY: Low   GOALS: Goals reviewed  with patient? Yes  SHORT TERM GOALS: Target date: 08/02/2022    Will be compliant with appropriate progressive HEP  Baseline: Goal status: 08/02/22- MET  2.  Pain to be no more than 5/10 at worst  Baseline:  Goal status: 08/02/22- MET 2/10 at worst   3.  Will demonstrate improved awareness of functional posture with all daily and work based tasks  Baseline:  Goal status: 08/02/22- MET   4.  All mm flexibility impairments to have resolved  Baseline:  Goal status: 08/02/22- PARTIALLY MET   LONG TERM GOALS: Target date: 08/23/2022    MMT to be 5/5 in all tested groups  Baseline:  Goal status: PARTIALLY MET /24/24  2.  Will be able to hold standard forearm plank for at least 30 seconds in order to  demonstrate improved core strength  Baseline:  Goal status: MET 08/02/22  3.  Pain to be no more than 3/10 at worst  Baseline:  Goal status: MET 08/02/22  4.  Will demonstrate good floor to waist lifting mechanics  Baseline:  Goal status: IN PROGRESS 08/02/22  5.  Will have been able to return to regular walking and hiking routine without increase in pain  Baseline:  Goal status: IN PROGRESS 08/02/22- knee is more problematic than the back    PLAN:  PT FREQUENCY: 2x/week  PT DURATION: 6 weeks  PLANNED INTERVENTIONS: Therapeutic exercises, Therapeutic activity, Neuromuscular re-education, Balance training, Gait training, Patient/Family education, Self Care, Joint mobilization, Stair training, Aquatic Therapy, Dry Needling, Spinal mobilization, Cryotherapy, Moist heat, Taping, Traction, Ultrasound, Ionotophoresis 4mg /ml Dexamethasone, Manual therapy, and Re-evaluation.  PLAN FOR NEXT SESSION: On hold until after she sees Dr. Roda Shutters for her knee June 6th, 3 more visits for [facility] strength and advanced biomechanics after she returns   Nedra Hai PT DPT PN2

## 2022-08-06 ENCOUNTER — Encounter: Payer: No Typology Code available for payment source | Admitting: Physical Therapy

## 2022-08-08 ENCOUNTER — Encounter: Payer: No Typology Code available for payment source | Admitting: Physical Therapy

## 2022-08-15 ENCOUNTER — Encounter: Payer: No Typology Code available for payment source | Admitting: Rehabilitative and Restorative Service Providers"

## 2022-08-15 ENCOUNTER — Other Ambulatory Visit (INDEPENDENT_AMBULATORY_CARE_PROVIDER_SITE_OTHER): Payer: No Typology Code available for payment source

## 2022-08-15 ENCOUNTER — Ambulatory Visit: Payer: No Typology Code available for payment source | Admitting: Orthopaedic Surgery

## 2022-08-15 DIAGNOSIS — Z96642 Presence of left artificial hip joint: Secondary | ICD-10-CM

## 2022-08-15 DIAGNOSIS — M25562 Pain in left knee: Secondary | ICD-10-CM | POA: Diagnosis not present

## 2022-08-15 DIAGNOSIS — G8929 Other chronic pain: Secondary | ICD-10-CM

## 2022-08-15 MED ORDER — BUPIVACAINE HCL 0.5 % IJ SOLN
2.0000 mL | INTRAMUSCULAR | Status: AC | PRN
  Administered 2022-08-15: 2 mL via INTRA_ARTICULAR

## 2022-08-15 MED ORDER — METHYLPREDNISOLONE ACETATE 40 MG/ML IJ SUSP
40.0000 mg | INTRAMUSCULAR | Status: AC | PRN
  Administered 2022-08-15: 40 mg via INTRA_ARTICULAR

## 2022-08-15 MED ORDER — LIDOCAINE HCL 1 % IJ SOLN
2.0000 mL | INTRAMUSCULAR | Status: AC | PRN
  Administered 2022-08-15: 2 mL

## 2022-08-15 NOTE — Progress Notes (Signed)
Office Visit Note   [name]           Date of Birth: [birth year]           MRN: [id number] Visit Date: 08/15/2022              Requested by: Mayer Masker, PA-C No address on file PCP: Melida Quitter, PA   Assessment & Plan: Visit Diagnoses:  1. Status post total hip replacement, left   2. Chronic pain of left knee     Plan: In regards to the hip replacement [name] has done very well and [name] has been very happy.  [name] can follow-up with me every 2 to 3 years for updated x-rays or just as needed.  For the left knee impression is medial meniscus tear with effusion.  Treatment options were explained and we performed an aspiration and cortisone injection today.  25 cc of synovial fluid obtained.  [name] will follow-up with me in about 6 weeks if symptoms do not improve.  Follow-Up Instructions: No follow-ups on file.   Orders:  Orders Placed This Encounter  Procedures   XR Pelvis 1-2 Views   XR KNEE 3 VIEW LEFT   Synovial Fluid Analysis, Complete   No orders of the defined types were placed in this encounter.     Procedures: Large Joint Inj: L knee on 08/15/2022 4:02 PM Details: 22 G needle Medications: 2 mL bupivacaine 0.5 %; 2 mL lidocaine 1 %; 40 mg methylPREDNISolone acetate 40 MG/ML Aspirate: 25 mL Outcome: tolerated well, no immediate complications Patient was prepped and draped in the usual sterile fashion.       Clinical Data: No additional findings.   Subjective: Chief Complaint  Patient presents with   Left Hip - Follow-up    24yr f/u left THA 08/21/20    HPI [name] comes in today for 2-year follow-up on left total hip replacement and evaluation of a new problem of left knee pain that started sometime around April without any known injuries or trauma.  [name] notices popping and giving way and swelling.  [name] reports medial sided knee pain.  Feels tightness behind the knee.  [name] has difficulty with using stairs.  [name] has no complaints with the  left hip. Review of Systems  Constitutional: Negative.   HENT: Negative.    Eyes: Negative.   Respiratory: Negative.    Cardiovascular: Negative.   Endocrine: Negative.   Musculoskeletal: Negative.   Neurological: Negative.   Hematological: Negative.   Psychiatric/Behavioral: Negative.    All other systems reviewed and are negative.    Objective: Vital Signs: There were no vitals taken for this visit.  Physical Exam Vitals and nursing note reviewed.  Constitutional:      Appearance: [name] is well-developed.  HENT:     Head: Normocephalic and atraumatic.  Pulmonary:     Effort: Pulmonary effort is normal.  Abdominal:     Palpations: Abdomen is soft.  Musculoskeletal:     Cervical back: Neck supple.  Skin:    General: Skin is warm.     Capillary Refill: Capillary refill takes less than 2 seconds.  Neurological:     Mental Status: [name] is alert and oriented to person, place, and time.  Psychiatric:        Behavior: Behavior normal.        Thought Content: Thought content normal.        Judgment: Judgment normal.     Ortho Exam Examination left  hip shows a fully healed surgical scar.  [name] has fluid painless range of motion.  Examination of left knee shows joint effusion.  Medial joint line tenderness.  Slightly limited flexion due to the effusion. Specialty Comments:  No specialty comments available.  Imaging: XR Pelvis 1-2 Views  Result Date: 08/15/2022 Stable left total hip replacement without complications  XR KNEE 3 VIEW LEFT  Result Date: 08/15/2022 X-rays demonstrate greater than 50% joint space narrowing of the femoral tibial compartments.  Periarticular spurring.    PMFS History: Patient Active Problem List   Diagnosis Date Noted   BMI 31.0-31.9,adult 04/21/2022   Prediabetes 08/08/2021   Mixed hyperlipidemia 08/08/2021   Lentigo 05/21/2021   Melanocytic nevi of right upper limb, including shoulder 05/21/2021   Melanocytic nevi of trunk 05/21/2021    Neoplasm of uncertain behavior of skin 05/21/2021   Other skin changes due to chronic exposure to nonionizing radiation 05/21/2021   Other seborrheic keratosis 05/21/2021   Skin tag 05/21/2021   Status post total replacement of left hip 08/21/2020   Vitamin D deficiency 07/13/2020   Pure hypercholesterolemia 07/13/2020   Gastroesophageal reflux disease 07/13/2020   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   Allergic rhinitis due to pollen 07/13/2020   Pain of right heel 07/07/2020   Primary osteoarthritis of left hip 04/25/2020   Hypothyroidism 09/30/2019   Heartburn 09/30/2019   Left hip pain 08/06/2017   Cholelithiases 10/11/2010   Past Medical History:  Diagnosis Date   Generalized headaches    GERD (gastroesophageal reflux disease)    Hypothyroidism    Thyroid disease    hypo    Family History  Problem Relation Age of Onset   Stroke Father    Transient ischemic attack Father    Heart disease Mother    COPD Mother    Cancer Mother    Breast cancer Mother    Heart attack Mother    Diabetes Mother    Breast cancer Maternal Aunt    Breast cancer Maternal Grandmother    Diabetes Paternal Grandmother     Past Surgical History:  Procedure Laterality Date   ABDOMINAL HYSTERECTOMY N/A    Phreesia 08/30/2019   APPENDECTOMY  2003   CESAREAN SECTION  2005   CESAREAN SECTION N/A    Phreesia 08/30/2019   CHOLECYSTECTOMY     DIAGNOSTIC LAPAROSCOPY     Lap chole.   ROBOTIC ASSISTED TOTAL HYSTERECTOMY Bilateral 09/29/2012   Procedure: ROBOTIC ASSISTED TOTAL HYSTERECTOMY WITH BILATERAL SALPINGECTOMY;  Surgeon: Serita Kyle, MD;  Location: [facility];  Service: Gynecology;  Laterality: Bilateral;  3 hrs.   TONSILLECTOMY     TONSILLECTOMY  1969   TOTAL HIP ARTHROPLASTY Left 08/21/2020   Procedure: LEFT TOTAL HIP ARTHROPLASTY ANTERIOR APPROACH;  Surgeon: Tarry Kos, MD;  Location: [facility];  Service: Orthopedics;  Laterality: Left;  3-C   Social History   Occupational  History   Occupation: document Copywriter, advertising: [employer]  Tobacco Use   Smoking status: Former    Packs/day: .25    Types: Cigarettes    Quit date: 03/11/2013    Years since quitting: 9.4   Smokeless tobacco: Never  Vaping Use   Vaping Use: Never used  Substance and Sexual Activity   Alcohol use: Yes    Comment: rare   Drug use: No   Sexual activity: Yes    Birth control/protection: None

## 2022-08-16 LAB — SYNOVIAL FLUID ANALYSIS, COMPLETE
Basophils, %: 0 %
Eosinophils-Synovial: 0 % (ref 0–2)
Lymphocytes-Synovial Fld: 55 % (ref 0–74)
Monocyte/Macrophage: 14 % (ref 0–69)
Neutrophil, Synovial: 31 % — ABNORMAL HIGH (ref 0–24)
Synoviocytes, %: 0 % (ref 0–15)
WBC, Synovial: 518 cells/uL — ABNORMAL HIGH (ref <150)

## 2022-08-27 ENCOUNTER — Other Ambulatory Visit: Payer: Self-pay | Admitting: *Deleted

## 2022-08-27 DIAGNOSIS — E1169 Type 2 diabetes mellitus with other specified complication: Secondary | ICD-10-CM

## 2022-08-27 MED ORDER — ATORVASTATIN CALCIUM 10 MG PO TABS: 10.0000 mg | ORAL_TABLET | Freq: Every day | ORAL | 0 refills | Status: DC

## 2022-09-09 ENCOUNTER — Other Ambulatory Visit: Payer: No Typology Code available for payment source

## 2022-09-09 DIAGNOSIS — Z0000 Encounter for general adult medical examination without abnormal findings: Secondary | ICD-10-CM

## 2022-09-09 DIAGNOSIS — E039 Hypothyroidism, unspecified: Secondary | ICD-10-CM

## 2022-09-09 DIAGNOSIS — R7303 Prediabetes: Secondary | ICD-10-CM

## 2022-09-09 DIAGNOSIS — E782 Mixed hyperlipidemia: Secondary | ICD-10-CM

## 2022-09-10 LAB — COMPREHENSIVE METABOLIC PANEL
ALT: 18 IU/L (ref 0–32)
AST: 17 IU/L (ref 0–40)
Albumin: 4.3 g/dL (ref 3.8–4.9)
Alkaline Phosphatase: 113 IU/L (ref 44–121)
BUN/Creatinine Ratio: 21 (ref 9–23)
BUN: 15 mg/dL (ref 6–24)
Bilirubin Total: 1.1 mg/dL (ref 0.0–1.2)
CO2: 23 mmol/L (ref 20–29)
Calcium: 9.5 mg/dL (ref 8.7–10.2)
Chloride: 105 mmol/L (ref 96–106)
Creatinine, Ser: 0.73 mg/dL (ref 0.57–1.00)
Globulin, Total: 2.3 g/dL (ref 1.5–4.5)
Glucose: 100 mg/dL — ABNORMAL HIGH (ref 70–99)
Potassium: 4.1 mmol/L (ref 3.5–5.2)
Sodium: 142 mmol/L (ref 134–144)
Total Protein: 6.6 g/dL (ref 6.0–8.5)
eGFR: 96 mL/min/{1.73_m2} (ref >59)

## 2022-09-10 LAB — CBC WITH DIFFERENTIAL/PLATELET
Basophils Absolute: 0 10*3/uL (ref 0.0–0.2)
Basos: 0 %
EOS (ABSOLUTE): 0.2 10*3/uL (ref 0.0–0.4)
Eos: 3 %
Hematocrit: 40.7 % (ref 34.0–46.6)
Hemoglobin: 13.6 g/dL (ref 11.1–15.9)
Immature Grans (Abs): 0 10*3/uL (ref 0.0–0.1)
Immature Granulocytes: 0 %
Lymphocytes Absolute: 1.5 10*3/uL (ref 0.7–3.1)
Lymphs: 29 %
MCH: 30.6 pg (ref 26.6–33.0)
MCHC: 33.4 g/dL (ref 31.5–35.7)
MCV: 92 fL (ref 79–97)
Monocytes Absolute: 0.4 10*3/uL (ref 0.1–0.9)
Monocytes: 9 %
Neutrophils Absolute: 2.9 10*3/uL (ref 1.4–7.0)
Neutrophils: 59 %
Platelets: 195 10*3/uL (ref 150–450)
RBC: 4.45 x10E6/uL (ref 3.77–5.28)
RDW: 12.8 % (ref 11.7–15.4)
WBC: 5 10*3/uL (ref 3.4–10.8)

## 2022-09-10 LAB — HEMOGLOBIN A1C
Est. average glucose Bld gHb Est-mCnc: 111 mg/dL
Hgb A1c MFr Bld: 5.5 % (ref 4.8–5.6)

## 2022-09-10 LAB — LIPID PANEL
Chol/HDL Ratio: 3.4 ratio (ref 0.0–4.4)
Cholesterol, Total: 153 mg/dL (ref 100–199)
HDL: 45 mg/dL (ref >39)
LDL Chol Calc (NIH): 83 mg/dL (ref 0–99)
Triglycerides: 141 mg/dL (ref 0–149)
VLDL Cholesterol Cal: 25 mg/dL (ref 5–40)

## 2022-09-10 LAB — TSH: TSH: 1.33 u[IU]/mL (ref 0.450–4.500)

## 2022-09-11 ENCOUNTER — Other Ambulatory Visit: Payer: No Typology Code available for payment source

## 2022-09-17 ENCOUNTER — Encounter: Payer: Self-pay | Admitting: Physical Therapy

## 2022-09-17 NOTE — Therapy (Signed)
PHYSICAL THERAPY DISCHARGE SUMMARY  Visits from Start of Care: 5  Current functional level related to goals / functional outcomes: Has not returned to PT in >30 days, DC per clinic policy    Remaining deficits: N/A    Education / Equipment: N/A    Patient agrees to discharge. Patient goals were partially met. Patient is being discharged due to not returning since the last visit.   Nedra Hai, PT, DPT 09/17/22 11:20 AM

## 2022-09-18 ENCOUNTER — Encounter: Payer: Self-pay | Admitting: Physician Assistant

## 2022-09-18 NOTE — Progress Notes (Signed)
Negative pap with evidence of vaginal atrophy and inflammation

## 2022-09-18 NOTE — Progress Notes (Signed)
Additional images recommended for screening mammogram .

## 2022-09-19 ENCOUNTER — Encounter: Payer: Self-pay | Admitting: Family Medicine

## 2022-09-19 ENCOUNTER — Ambulatory Visit: Payer: No Typology Code available for payment source | Admitting: Family Medicine

## 2022-09-19 VITALS — BP 138/85 | HR 82 | Resp 18 | Ht 63.0 in | Wt 176.0 lb

## 2022-09-19 DIAGNOSIS — E782 Mixed hyperlipidemia: Secondary | ICD-10-CM | POA: Diagnosis not present

## 2022-09-19 DIAGNOSIS — E039 Hypothyroidism, unspecified: Secondary | ICD-10-CM | POA: Diagnosis not present

## 2022-09-19 DIAGNOSIS — Z1159 Encounter for screening for other viral diseases: Secondary | ICD-10-CM

## 2022-09-19 DIAGNOSIS — Z23 Encounter for immunization: Secondary | ICD-10-CM | POA: Diagnosis not present

## 2022-09-19 MED ORDER — SYNTHROID 75 MCG PO TABS
75.0000 ug | ORAL_TABLET | Freq: Every day | ORAL | 1 refills | Status: DC
Start: 2022-09-19 — End: 2023-03-24

## 2022-09-19 MED ORDER — ATORVASTATIN CALCIUM 10 MG PO TABS
10.0000 mg | ORAL_TABLET | Freq: Every day | ORAL | 1 refills | Status: DC
Start: 2022-09-19 — End: 2023-03-21

## 2022-09-19 NOTE — Assessment & Plan Note (Signed)
Last lipid panel: LDL 83, HDL 45, triglycerides 141.  CMP within normal limits.  Continue atorvastatin 10 mg daily.  Will continue to monitor.

## 2022-09-19 NOTE — Assessment & Plan Note (Signed)
Last TSH within normal limits at 1.330.  Continue Synthroid 75 mcg daily.  Will continue to monitor with lab work every 6-12 months.

## 2022-09-19 NOTE — Progress Notes (Signed)
   Established Patient Office Visit  Subjective   Patient ID: [name], female    DOB: 05-13-1965  Age: 57 y.o. MRN: [id number]  Chief Complaint  Patient presents with   Hyperlipidemia   Arthritis         HPI [name] is a 57 y.o. female presenting today for follow up of hyperlipidemia, hypothyroidism. Hyperlipidemia: tolerating atorvastatin well with no myalgias or significant side effects. Currently consuming a low fat diet.  She enjoys walking for exercise.  She does find that her arthritis is somewhat of a limitation and plans on talking to her orthopedic provider to discuss management options further. The 10-year ASCVD risk score (Arnett DK, et al., 2019) is: 4.9% Hypothyroidism: Taking levothyroxine 75 mcg regularly in the AM away from food and vitamins. Denies fatigue, weight changes, heat/cold intolerance, skin/hair changes, bowel changes, CVS symptoms.  ROS Negative unless otherwise noted in HPI   Objective:     BP 138/85   Pulse 82   Resp 18   Ht 5\' 3"  (1.6 m)   Wt 176 lb (79.8 kg)   SpO2 97%   BMI 31.18 kg/m   Physical Exam Constitutional:      General: She is not in acute distress.    Appearance: Normal appearance.  HENT:     Head: Normocephalic and atraumatic.  Cardiovascular:     Rate and Rhythm: Normal rate and regular rhythm.     Heart sounds: No murmur heard.    No friction rub. No gallop.  Pulmonary:     Effort: Pulmonary effort is normal. No respiratory distress.     Breath sounds: No wheezing, rhonchi or rales.  Skin:    General: Skin is warm and dry.  Neurological:     Mental Status: She is alert and oriented to person, place, and time.     Assessment & Plan:  Mixed hyperlipidemia Assessment & Plan: Last lipid panel: LDL 83, HDL 45, triglycerides 141.  CMP within normal limits.  Continue atorvastatin 10 mg daily.  Will continue to monitor.  Orders: -     Atorvastatin Calcium; Take 1 tablet (10 mg total) by mouth daily.  Dispense: 90  tablet; Refill: 1  Acquired hypothyroidism Assessment & Plan: Last TSH within normal limits at 1.330.  Continue Synthroid 75 mcg daily.  Will continue to monitor with lab work every 6-12 months.  Orders: -     Synthroid; Take 1 tablet (75 mcg total) by mouth daily.  Dispense: 90 tablet; Refill: 1  Need for Tdap vaccination -     Tdap vaccine greater than or equal to 7yo IM  Screening for viral disease -     HIV Antibody (routine testing w rflx); Future    Return in about 6 months (around 03/22/2023) for annual physical, fasting blood work 1 week before.    Melida Quitter, PA

## 2023-02-28 ENCOUNTER — Other Ambulatory Visit: Payer: Self-pay

## 2023-02-28 DIAGNOSIS — R7303 Prediabetes: Secondary | ICD-10-CM

## 2023-02-28 DIAGNOSIS — E782 Mixed hyperlipidemia: Secondary | ICD-10-CM

## 2023-02-28 DIAGNOSIS — Z0000 Encounter for general adult medical examination without abnormal findings: Secondary | ICD-10-CM

## 2023-03-06 LAB — HM MAMMOGRAPHY

## 2023-03-17 ENCOUNTER — Other Ambulatory Visit: Payer: No Typology Code available for payment source

## 2023-03-17 DIAGNOSIS — Z0000 Encounter for general adult medical examination without abnormal findings: Secondary | ICD-10-CM

## 2023-03-17 DIAGNOSIS — Z1159 Encounter for screening for other viral diseases: Secondary | ICD-10-CM

## 2023-03-17 DIAGNOSIS — E782 Mixed hyperlipidemia: Secondary | ICD-10-CM

## 2023-03-17 DIAGNOSIS — R7303 Prediabetes: Secondary | ICD-10-CM

## 2023-03-18 LAB — COMPREHENSIVE METABOLIC PANEL
ALT: 25 [IU]/L (ref 0–32)
AST: 19 [IU]/L (ref 0–40)
Albumin: 4.5 g/dL (ref 3.8–4.9)
Alkaline Phosphatase: 113 [IU]/L (ref 44–121)
BUN/Creatinine Ratio: 23 (ref 9–23)
BUN: 17 mg/dL (ref 6–24)
Bilirubin Total: 1.4 mg/dL — ABNORMAL HIGH (ref 0.0–1.2)
CO2: 27 mmol/L (ref 20–29)
Calcium: 9.5 mg/dL (ref 8.7–10.2)
Chloride: 104 mmol/L (ref 96–106)
Creatinine, Ser: 0.73 mg/dL (ref 0.57–1.00)
Globulin, Total: 2.1 g/dL (ref 1.5–4.5)
Glucose: 104 mg/dL — ABNORMAL HIGH (ref 70–99)
Potassium: 4.4 mmol/L (ref 3.5–5.2)
Sodium: 141 mmol/L (ref 134–144)
Total Protein: 6.6 g/dL (ref 6.0–8.5)
eGFR: 95 mL/min/{1.73_m2} (ref >59)

## 2023-03-18 LAB — CBC WITH DIFFERENTIAL/PLATELET
Basophils Absolute: 0 10*3/uL (ref 0.0–0.2)
Basos: 0 %
EOS (ABSOLUTE): 0.1 10*3/uL (ref 0.0–0.4)
Eos: 2 %
Hematocrit: 42.2 % (ref 34.0–46.6)
Hemoglobin: 13.8 g/dL (ref 11.1–15.9)
Immature Grans (Abs): 0 10*3/uL (ref 0.0–0.1)
Immature Granulocytes: 0 %
Lymphocytes Absolute: 1.4 10*3/uL (ref 0.7–3.1)
Lymphs: 28 %
MCH: 29.8 pg (ref 26.6–33.0)
MCHC: 32.7 g/dL (ref 31.5–35.7)
MCV: 91 fL (ref 79–97)
Monocytes Absolute: 0.4 10*3/uL (ref 0.1–0.9)
Monocytes: 8 %
Neutrophils Absolute: 3.2 10*3/uL (ref 1.4–7.0)
Neutrophils: 62 %
Platelets: 172 10*3/uL (ref 150–450)
RBC: 4.63 x10E6/uL (ref 3.77–5.28)
RDW: 12.2 % (ref 11.7–15.4)
WBC: 5.1 10*3/uL (ref 3.4–10.8)

## 2023-03-18 LAB — LIPID PANEL
Chol/HDL Ratio: 3.1 {ratio} (ref 0.0–4.4)
Cholesterol, Total: 151 mg/dL (ref 100–199)
HDL: 49 mg/dL (ref >39)
LDL Chol Calc (NIH): 83 mg/dL (ref 0–99)
Triglycerides: 102 mg/dL (ref 0–149)
VLDL Cholesterol Cal: 19 mg/dL (ref 5–40)

## 2023-03-18 LAB — HEMOGLOBIN A1C
Est. average glucose Bld gHb Est-mCnc: 114 mg/dL
Hgb A1c MFr Bld: 5.6 % (ref 4.8–5.6)

## 2023-03-18 LAB — HIV ANTIBODY (ROUTINE TESTING W REFLEX): HIV Screen 4th Generation wRfx: NONREACTIVE

## 2023-03-21 ENCOUNTER — Other Ambulatory Visit: Payer: Self-pay | Admitting: Family Medicine

## 2023-03-21 DIAGNOSIS — E782 Mixed hyperlipidemia: Secondary | ICD-10-CM

## 2023-03-24 ENCOUNTER — Ambulatory Visit (INDEPENDENT_AMBULATORY_CARE_PROVIDER_SITE_OTHER): Payer: No Typology Code available for payment source | Admitting: Family Medicine

## 2023-03-24 ENCOUNTER — Encounter: Payer: Self-pay | Admitting: Family Medicine

## 2023-03-24 VITALS — BP 124/78 | HR 84 | Temp 98.5°F | Ht 63.0 in | Wt 177.8 lb

## 2023-03-24 DIAGNOSIS — Z0000 Encounter for general adult medical examination without abnormal findings: Secondary | ICD-10-CM | POA: Diagnosis not present

## 2023-03-24 DIAGNOSIS — E782 Mixed hyperlipidemia: Secondary | ICD-10-CM

## 2023-03-24 DIAGNOSIS — R7303 Prediabetes: Secondary | ICD-10-CM | POA: Diagnosis not present

## 2023-03-24 DIAGNOSIS — E039 Hypothyroidism, unspecified: Secondary | ICD-10-CM | POA: Diagnosis not present

## 2023-03-24 MED ORDER — LEVOTHYROXINE SODIUM 75 MCG PO TABS: 75.0000 ug | ORAL_TABLET | Freq: Every day | ORAL | 1 refills | Status: DC

## 2023-03-24 NOTE — Assessment & Plan Note (Signed)
 Last lipid panel: LDL 83, HDL 49, triglycerides 102.  Hepatic function within normal limits.  Continue atorvastatin 10 mg daily.  Will continue to monitor.

## 2023-03-24 NOTE — Assessment & Plan Note (Signed)
Last TSH within normal limits at 1.330.  Continue Synthroid 75 mcg daily.  Will continue to monitor with lab work every 6-12 months.

## 2023-03-24 NOTE — Progress Notes (Signed)
 Complete physical exam  [name]   DOB: 1965/11/14   58 y.o. Female  MRN: [id number]  Subjective:    Chief Complaint  Patient presents with   Annual Exam    [name] is a 58 y.o. female who presents today for a complete physical exam. She reports consuming a general diet.  She has been experiencing quite a bit of knee pain and hip pain needing to take more ibuprofen  than usual.  This has caused an increase in GERD symptoms, so she has also been needing to take omeprazole about 4 times a week.  She plans to follow-up with orthopedics within the next couple of months. She does not have additional problems to discuss today.    Most recent fall risk assessment:    03/24/2023    9:33 AM  Fall Risk   Falls in the past year? 0  Number falls in past yr: 0  Injury with Fall? 0  Risk for fall due to : No Fall Risks  Follow up Falls evaluation completed     Most recent depression and anxiety screenings:    03/24/2023    9:22 AM 03/21/2022    9:24 AM  PHQ 2/9 Scores  PHQ - 2 Score 0 0  PHQ- 9 Score 2 1      03/24/2023    9:22 AM 03/21/2022    9:24 AM 12/26/2021   10:54 AM 08/08/2021   10:24 AM  GAD 7 : Generalized Anxiety Score  Nervous, Anxious, on Edge 0 0 0 0  Control/stop worrying 0 0 0 0  Worry too much - different things 0 0 0 0  Trouble relaxing 0 0 0 0  Restless 0 0 0 0  Easily annoyed or irritable 0 0 0 0  Afraid - awful might happen 0 0 0 0  Total GAD 7 Score 0 0 0 0  Anxiety Difficulty Not difficult at all  Not difficult at all Not difficult at all    Patient Active Problem List   Diagnosis Date Noted   BMI 31.0-31.9,adult 04/21/2022   Prediabetes 08/08/2021   Lentigo 05/21/2021   Melanocytic nevi of right upper limb, including shoulder 05/21/2021   Melanocytic nevi of trunk 05/21/2021   Neoplasm of uncertain behavior of skin 05/21/2021   Other skin changes due to chronic exposure to nonionizing radiation 05/21/2021   Other seborrheic keratosis  05/21/2021   Skin tag 05/21/2021   Status post total replacement of left hip 08/21/2020   Vitamin D deficiency 07/13/2020   Gastroesophageal reflux disease 07/13/2020   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   ROBOTIC ASSISTED TOTAL HYSTERECTOMY Bilateral 09/29/2012   Procedure: ROBOTIC ASSISTED TOTAL HYSTERECTOMY WITH BILATERAL SALPINGECTOMY;  Surgeon: Dickie DELENA Carder, MD;  Location: [facility];  Service: Gynecology;  Laterality: Bilateral;  3 hrs.   TONSILLECTOMY     TONSILLECTOMY  1969   TOTAL HIP ARTHROPLASTY Left 08/21/2020   Procedure: LEFT TOTAL HIP ARTHROPLASTY ANTERIOR APPROACH;  Surgeon: Jerri Kay HERO,  MD;  Location: [facility];  Service: Orthopedics;  Laterality: Left;  3-C   Social History   Tobacco Use   Smoking status: Former    Current packs/day: 0.00    Types: Cigarettes    Quit date: 03/11/2013    Years since quitting: 10.0    Passive exposure: Past   Smokeless tobacco: Never  Vaping Use   Vaping status: Never Used  Substance Use Topics   Alcohol use: Yes    Comment: rare   Drug use: No   Family History  Problem Relation Age of Onset   Stroke Father    Transient ischemic attack Father    Heart disease Mother    COPD Mother    Cancer Mother    Breast cancer Mother    Heart attack Mother    Diabetes Mother    Breast cancer Maternal Aunt    Breast cancer Maternal Grandmother    Diabetes Paternal Grandmother    Allergies  Allergen Reactions   Succinylcholine Rash     Patient Care Team: Wallace Joesph LABOR, PA as  PCP - General (Family Medicine)   Outpatient Medications Prior to Visit  Medication Sig   atorvastatin  (LIPITOR) 10 MG tablet TAKE 1 TABLET BY MOUTH EVERY DAY   Cholecalciferol (VITAMIN D3) 3000 UNITS TABS Take 3,000 Units by mouth daily.   Coenzyme Q10 200 MG capsule Take 400 mg by mouth daily.   Multiple Vitamin (MULTIVITAMIN PO) Take 1 tablet by mouth daily.   omeprazole (PRILOSEC) 20 MG capsule Take 20 mg by mouth daily as needed (acid reflux).   [DISCONTINUED] SYNTHROID  75 MCG tablet Take 1 tablet (75 mcg total) by mouth daily.   No facility-administered medications prior to visit.    Review of Systems  Constitutional:  Negative for chills, fever and malaise/fatigue.  HENT:  Negative for congestion and hearing loss.   Eyes:  Negative for blurred vision and double vision.  Respiratory:  Negative for cough and shortness of breath.   Cardiovascular:  Negative for chest pain, palpitations and leg swelling.  Gastrointestinal:  Negative for abdominal pain, constipation, diarrhea and heartburn.  Genitourinary:  Negative for frequency and urgency.  Musculoskeletal:  Negative for myalgias and neck pain.  Neurological:  Negative for headaches.  Endo/Heme/Allergies:  Negative for polydipsia.  Psychiatric/Behavioral:  Negative for depression. The patient is not nervous/anxious.       Objective:    BP 124/78   Pulse 84   Temp 98.5 F (36.9 C) (Oral)   Ht 5' 3 (1.6 m)   Wt 177 lb 12 oz (80.6 kg)   SpO2 98%   BMI 31.49 kg/m    Physical Exam Constitutional:      General: She is not in acute distress.    Appearance: Normal appearance.  HENT:     Head: Normocephalic and atraumatic.     Right Ear: Tympanic membrane, ear canal and external ear normal.     Left Ear: Tympanic membrane, ear canal and external ear normal.     Nose: Nose normal.     Mouth/Throat:     Mouth: Mucous membranes are moist.     Pharynx: No oropharyngeal exudate or posterior oropharyngeal erythema.  Eyes:      Extraocular Movements: Extraocular movements intact.     Conjunctiva/sclera: Conjunctivae normal.     Pupils: Pupils are equal, round, and reactive to light.     Comments: Does not wear glasses or contacts  Neck:     Thyroid: No thyroid  mass, thyromegaly or thyroid tenderness.  Cardiovascular:     Rate and Rhythm: Normal rate and regular rhythm.     Heart sounds: Normal heart sounds. No murmur heard.    No friction rub. No gallop.  Pulmonary:     Effort: Pulmonary effort is normal. No respiratory distress.     Breath sounds: Normal breath sounds. No wheezing, rhonchi or rales.  Abdominal:     General: Abdomen is flat. Bowel sounds are normal. There is no distension.     Palpations: There is no mass.     Tenderness: There is no abdominal tenderness. There is no guarding.  Musculoskeletal:        General: Normal range of motion.     Cervical back: Normal range of motion and neck supple.  Lymphadenopathy:     Cervical: No cervical adenopathy.  Skin:    General: Skin is warm and dry.  Neurological:     Mental Status: She is alert and oriented to person, place, and time.     Cranial Nerves: No cranial nerve deficit.     Motor: No weakness.     Deep Tendon Reflexes: Reflexes normal.  Psychiatric:        Mood and Affect: Mood normal.        Assessment & Plan:    Routine Health Maintenance and Physical Exam  Immunization History  Administered Date(s) Administered   DTaP 07/30/2011   Influenza, Mdck, Trivalent,PF 6+ MOS(egg free) 01/25/2023   Influenza,inj,Quad PF,6+ Mos 12/30/2019, 01/15/2021, 12/26/2021   PFIZER(Purple Top)SARS-COV-2 Vaccination 06/19/2019, 07/12/2019, 02/12/2020, 01/25/2023   Pfizer(Comirnaty)Fall Seasonal Vaccine 12 years and older 03/23/2022   Tdap 07/30/2011, 09/19/2022   Tetanus 05/10/1999   Zoster Recombinant(Shingrix ) 12/30/2019, 07/13/2020    Health Maintenance  Topic Date Due   COVID-19 Vaccine (6 - 2024-25 season) 04/09/2023 (Originally  03/22/2023)   MAMMOGRAM  03/05/2025   Colonoscopy  10/25/2026   DTaP/Tdap/Td (4 - Td or Tdap) 09/18/2032   INFLUENZA VACCINE  Completed   Hepatitis C Screening  Completed   HIV Screening  Completed   Zoster Vaccines- Shingrix   Completed   HPV VACCINES  Aged Out    Reviewed most recent labs including CBC, CMP, lipid panel, A1C. All within normal limits/stable from last check other than bilirubin elevated again at 1.4.  Discussed health benefits of physical activity, and encouraged her to engage in regular exercise appropriate for her age and condition.  Wellness examination  Acquired hypothyroidism Assessment & Plan: Last TSH within normal limits at 1.330.  Continue Synthroid  75 mcg daily.  Will continue to monitor with lab work every 6-12 months.  Orders: -     Levothyroxine  Sodium; Take 1 tablet (75 mcg total) by mouth daily.  Dispense: 90 tablet; Refill: 1  Mixed hyperlipidemia Assessment & Plan: Last lipid panel: LDL 83, HDL 49, triglycerides 102.  Hepatic function within normal limits.  Continue atorvastatin  10 mg daily.  Will continue to monitor.   Prediabetes Assessment & Plan: A1c 5.6.  Will continue to monitor.     Return in about 6 months (around 09/21/2023) for follow-up for HLD, thyroid, fasting blood work 1 week before.     Joesph DELENA Sear, PA

## 2023-03-24 NOTE — Patient Instructions (Signed)
 It was lovely to see you today, keep up the fantastic work!

## 2023-03-24 NOTE — Assessment & Plan Note (Signed)
A1c 5.6.  Will continue to monitor.

## 2023-04-17 ENCOUNTER — Encounter: Payer: Self-pay | Admitting: Family Medicine

## 2023-05-02 ENCOUNTER — Encounter: Payer: Self-pay | Admitting: Orthopaedic Surgery

## 2023-05-02 ENCOUNTER — Ambulatory Visit: Payer: No Typology Code available for payment source | Admitting: Orthopaedic Surgery

## 2023-05-02 DIAGNOSIS — G8929 Other chronic pain: Secondary | ICD-10-CM | POA: Diagnosis not present

## 2023-05-02 DIAGNOSIS — M25562 Pain in left knee: Secondary | ICD-10-CM | POA: Diagnosis not present

## 2023-05-02 NOTE — Progress Notes (Signed)
Office Visit Note   [name]           Date of Birth: 22-Aug-1965           MRN: [id number] Visit Date: 05/02/2023              Requested by: Melida Quitter, PA [address] [place],  [place] [address] PCP: Melida Quitter, PA   Assessment & Plan: Visit Diagnoses:  1. Chronic pain of left knee     Plan: [name] is a 58 year old female with chronic left knee pain.  Temporary relief from cortisone injection last summer.  At this point she would like to move forward with MRI for further clarification of why her knee continues to hurt and to rule out structural abnormalities.  Follow-up after the MRI.  Follow-Up Instructions: No follow-ups on file.   Orders:  Orders Placed This Encounter  Procedures   MR Knee Left w/o contrast   No orders of the defined types were placed in this encounter.     Procedures: No procedures performed   Clinical Data: No additional findings.   Subjective: Chief Complaint  Patient presents with   Left Knee - Pain    HPI [name] is a 58 year old female comes in for follow-up evaluation of left knee pain.  I saw her in June 2024 and we did an aspiration cortisone injection.  Her symptoms returned in the fall.  Is worse with activity.  Aleve seems to help better than ibuprofen.  It starting to affect her ability to do certain activities.  Review of Systems  Constitutional: Negative.   HENT: Negative.    Eyes: Negative.   Respiratory: Negative.    Cardiovascular: Negative.   Endocrine: Negative.   Musculoskeletal: Negative.   Neurological: Negative.   Hematological: Negative.   Psychiatric/Behavioral: Negative.    All other systems reviewed and are negative.    Objective: Vital Signs: There were no vitals taken for this visit.  Physical Exam Vitals and nursing note reviewed.  Constitutional:      Appearance: She is well-developed.  HENT:     Head: Normocephalic and atraumatic.  Pulmonary:     Effort:  Pulmonary effort is normal.  Abdominal:     Palpations: Abdomen is soft.  Musculoskeletal:     Cervical back: Neck supple.  Skin:    General: Skin is warm.     Capillary Refill: Capillary refill takes less than 2 seconds.  Neurological:     Mental Status: She is alert and oriented to person, place, and time.  Psychiatric:        Behavior: Behavior normal.        Thought Content: Thought content normal.        Judgment: Judgment normal.     Ortho Exam Examination of the left knee shows trace effusion.  Normal range of motion.  Collaterals and cruciates are stable. Specialty Comments:  No specialty comments available.  Imaging: No results found.   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   Allergic rhinitis due to pollen 07/13/2020   Mixed hyperlipidemia 07/13/2020   Primary osteoarthritis of left hip 04/25/2020   Hypothyroidism 09/30/2019   Cholelithiases 10/11/2010   Past Medical History:  Diagnosis Date   Generalized headaches    GERD (gastroesophageal reflux disease)    Hypothyroidism    Thyroid disease    hypo    Family History  Problem Relation Age of Onset   Stroke Father    Transient ischemic attack Father    Heart disease Mother    COPD Mother    Cancer Mother    Breast cancer Mother    Heart attack Mother    Diabetes Mother    Breast cancer Maternal Aunt    Breast  cancer Maternal Grandmother    Diabetes Paternal Grandmother     Past Surgical History:  Procedure Laterality Date   ABDOMINAL HYSTERECTOMY N/A    [facility]ia 08/30/2019   APPENDECTOMY  2003   CESAREAN SECTION  2005   CESAREAN SECTION N/A    [facility]reesia 08/30/2019   CHOLECYSTECTOMY     DIAGNOSTIC LAPAROSCOPY     Lap chole.   ROBOTIC ASSISTED TOTAL HYSTERECTOMY Bilateral 09/29/2012   Procedure: ROBOTIC ASSISTED TOTAL HYSTERECTOMY WITH BILATERAL SALPINGECTOMY;  Surgeon: Serita Kyle, MD;  Location: [facility];  Service: Gynecology;  Laterality: Bilateral;  3 hrs.   TONSILLECTOMY     TONSILLECTOMY  1969   TOTAL HIP ARTHROPLASTY Left 08/21/2020   Procedure: LEFT TOTAL HIP ARTHROPLASTY ANTERIOR APPROACH;  Surgeon: Tarry Kos, MD;  Location: [facility];  Service: Orthopedics;  Laterality: Left;  3-C   Social History   Occupational History   Occupation: document Copywriter, advertising: [employer]  Tobacco Use   Smoking status: Former    Current packs/day: 0.00    Types: Cigarettes    Quit date: 03/11/2013    Years since quitting: 10.1    Passive exposure: Past   Smokeless tobacco: Never  Vaping Use   Vaping status: Never Used  Substance and Sexual Activity   Alcohol use: Yes    Comment: rare   Drug use: No   Sexual activity: Yes    Birth control/protection: None

## 2023-05-08 IMAGING — CR DG HIP (WITH OR WITHOUT PELVIS) 1V PORT*L*
1 series · 1 of 1 positions shown · non-contrast
Comparison: Intraoperative spot fluoro films from earlier the same
day.

CLINICAL DATA: Gas haze stuck to [REDACTED] ligament that
towel left hip on [place]

EXAM:
DG HIP (WITH OR WITHOUT PELVIS) 1V PORT LEFT

[AP]
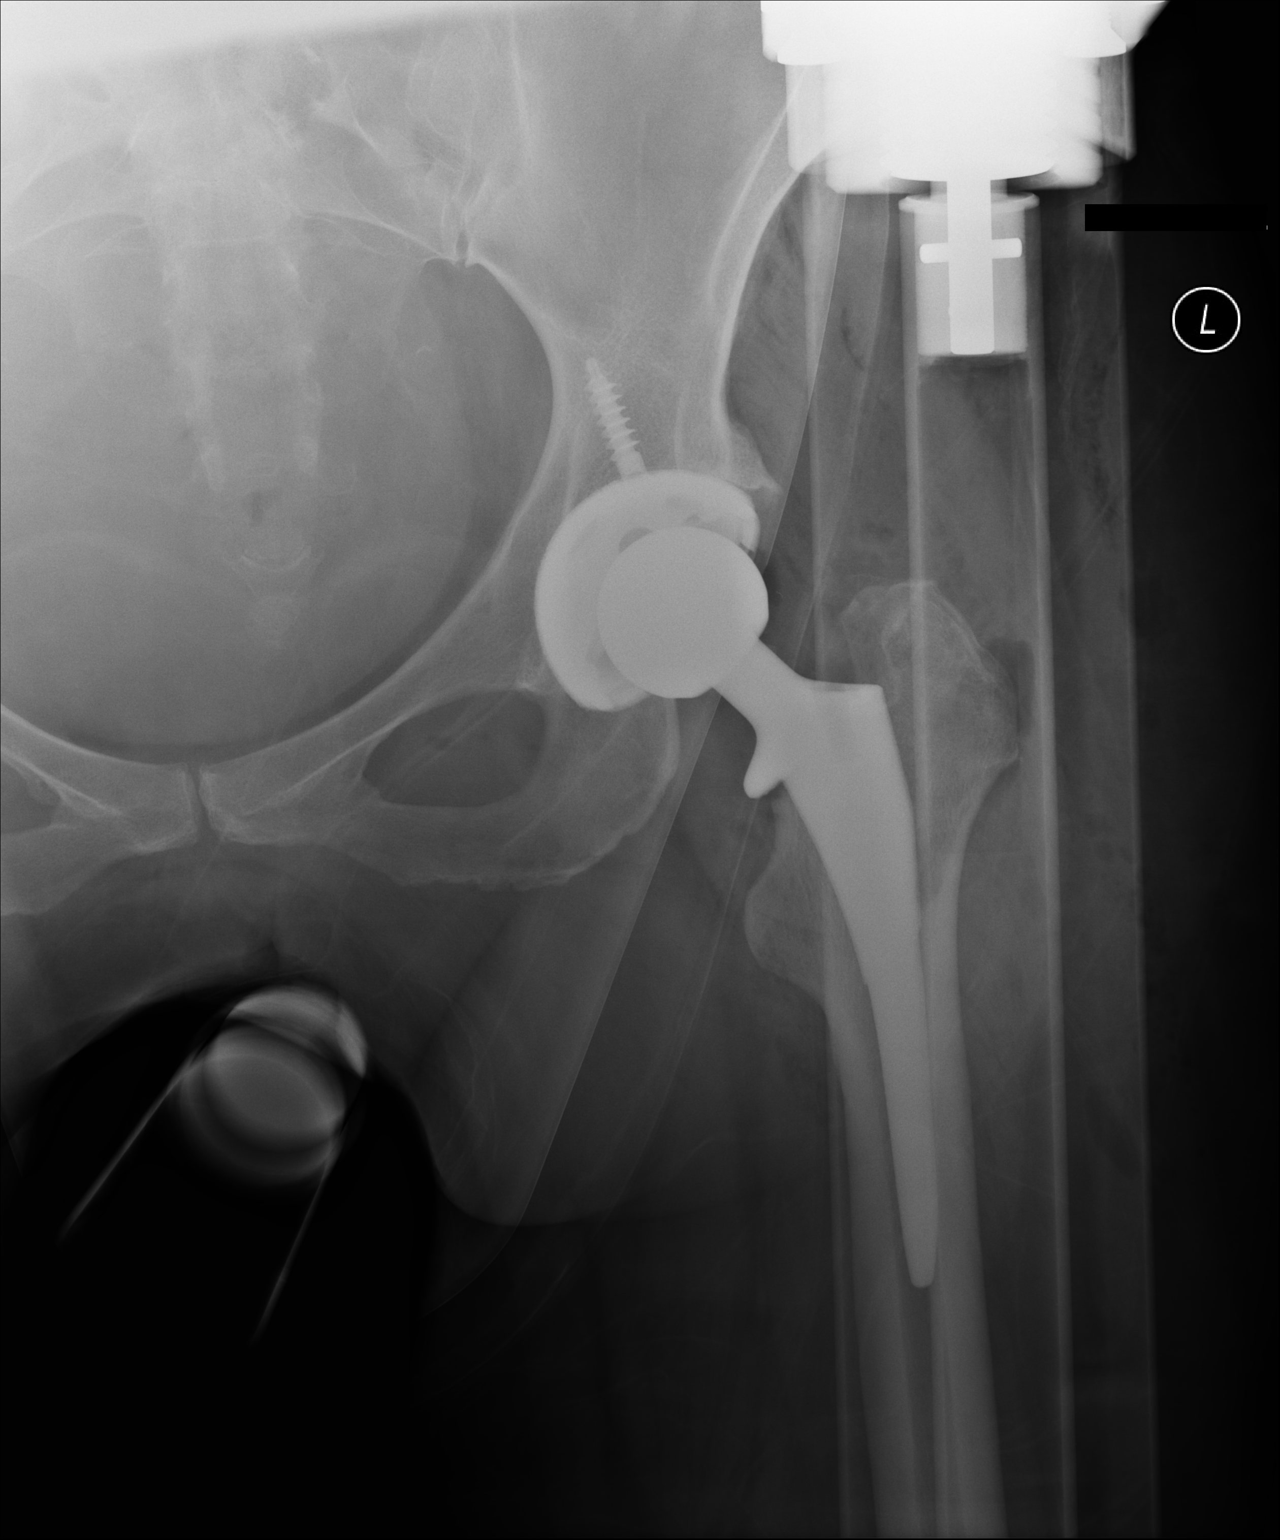

[1 of 1 positions shown; findings below may reference images not displayed]

FINDINGS: Status post left total hip replacement. No evidence for unexpected
soft tissue foreign body.
IMPRESSION: No evidence for unexpected radiopaque soft tissue foreign body.

Results called directly to the OR circulator, Brigido Beasley, at [DATE] p.m.
on 08/21/2020.

## 2023-05-08 IMAGING — DX DG PORTABLE PELVIS
1 series · 1 of 1 positions shown · non-contrast
Comparison: Intraoperative left hip radiographs August 21, 2020

CLINICAL DATA: Status post total hip replacement

EXAM:
PORTABLE PELVIS 1-2 VIEWS

[pelvis ap]
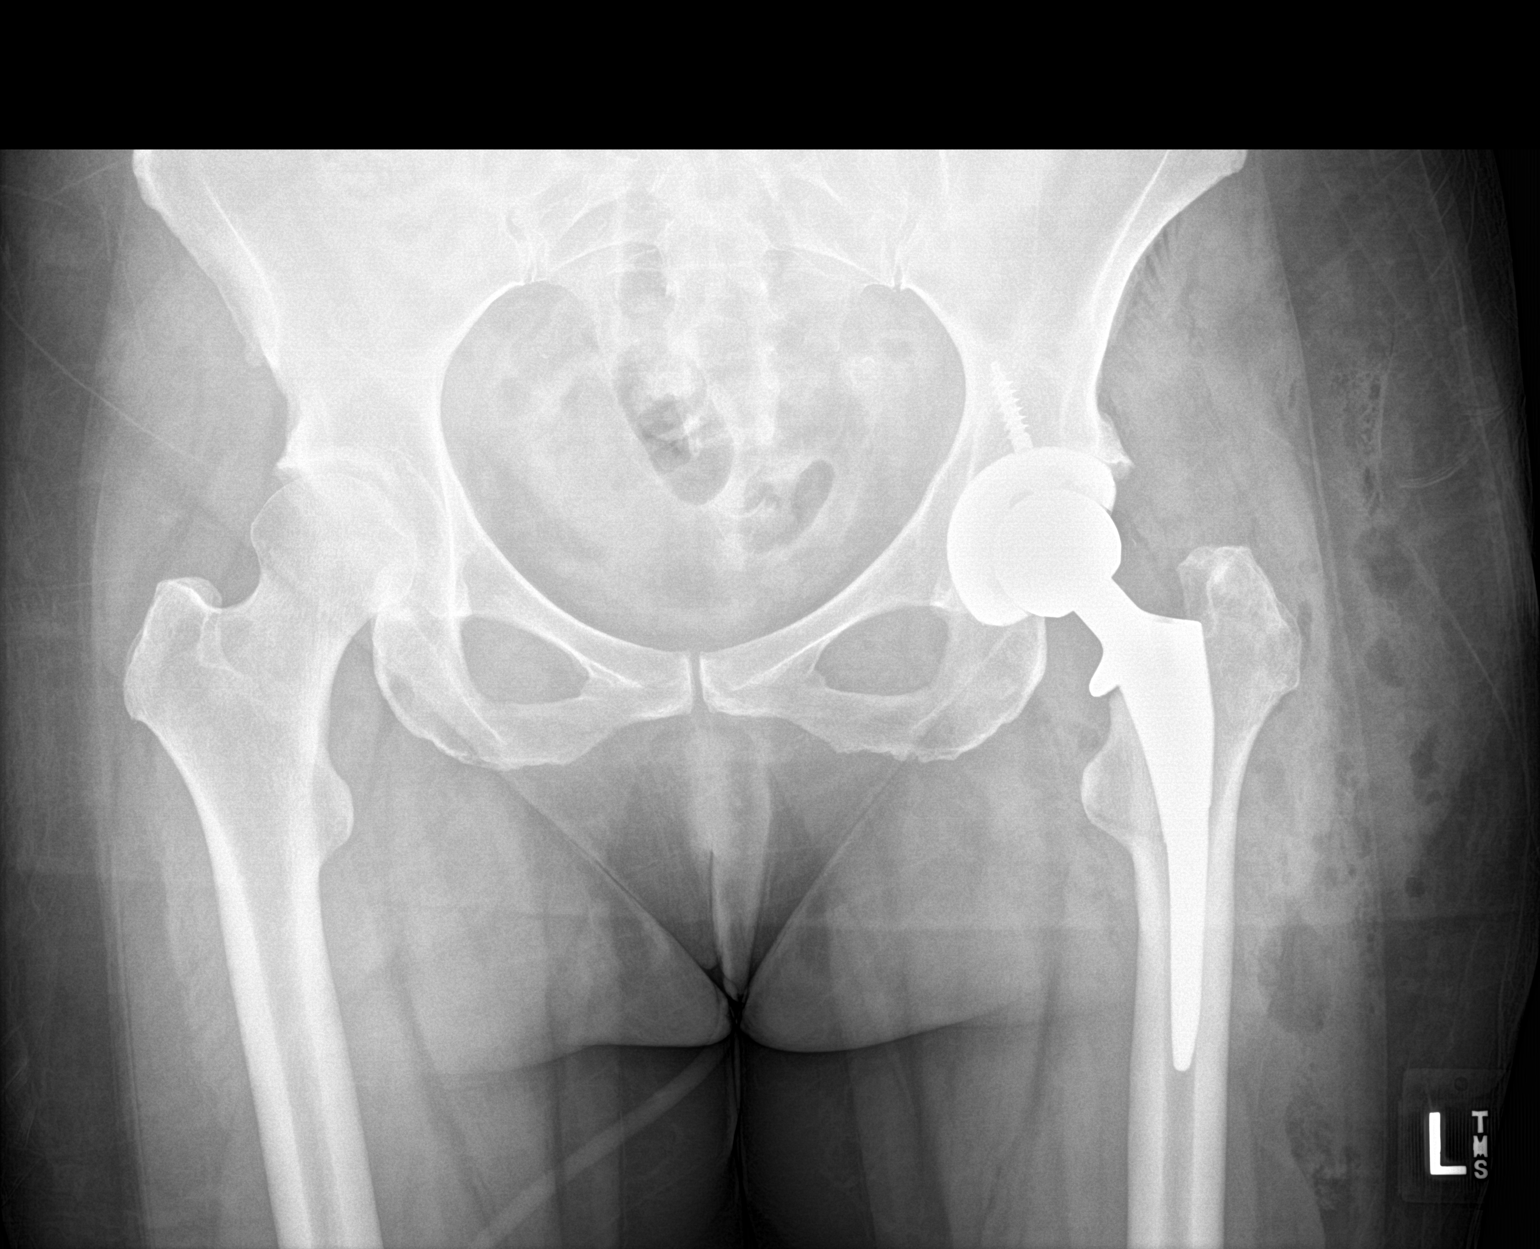

[1 of 1 positions shown; findings below may reference images not displayed]

FINDINGS: Frontal pelvis obtained. There is a total hip replacement on the
left with prosthetic components well-seated on frontal view. No
fracture or dislocation. There is mild narrowing of the right hip
joint. Soft tissue air noted on the left.
IMPRESSION: Total hip replacement on the left with prosthetic components
well-seated on frontal view. No fracture or dislocation. Mild
narrowing right hip joint. Acute postoperative changes noted on the
left.

## 2023-05-08 IMAGING — RF DG HIP (WITH PELVIS) OPERATIVE*L*
1 series · 3 of 3 positions shown · non-contrast
Comparison: Radiographs of the left hip 03/07/2020.

CLINICAL DATA: Surgery, elective. Additional history provided:
Left-sided hip arthroplasty with anterior approach. Provided
fluoroscopy time 0 minutes, 41 seconds (7.6 mGy).

EXAM:
OPERATIVE left HIP (WITH PELVIS IF PERFORMED) 3 VIEWS
TECHNIQUE: Fluoroscopic spot image(s) were submitted for interpretation
post-operatively.

[Series 1: run · 3 of 3 slices shown]
[im 1/3]
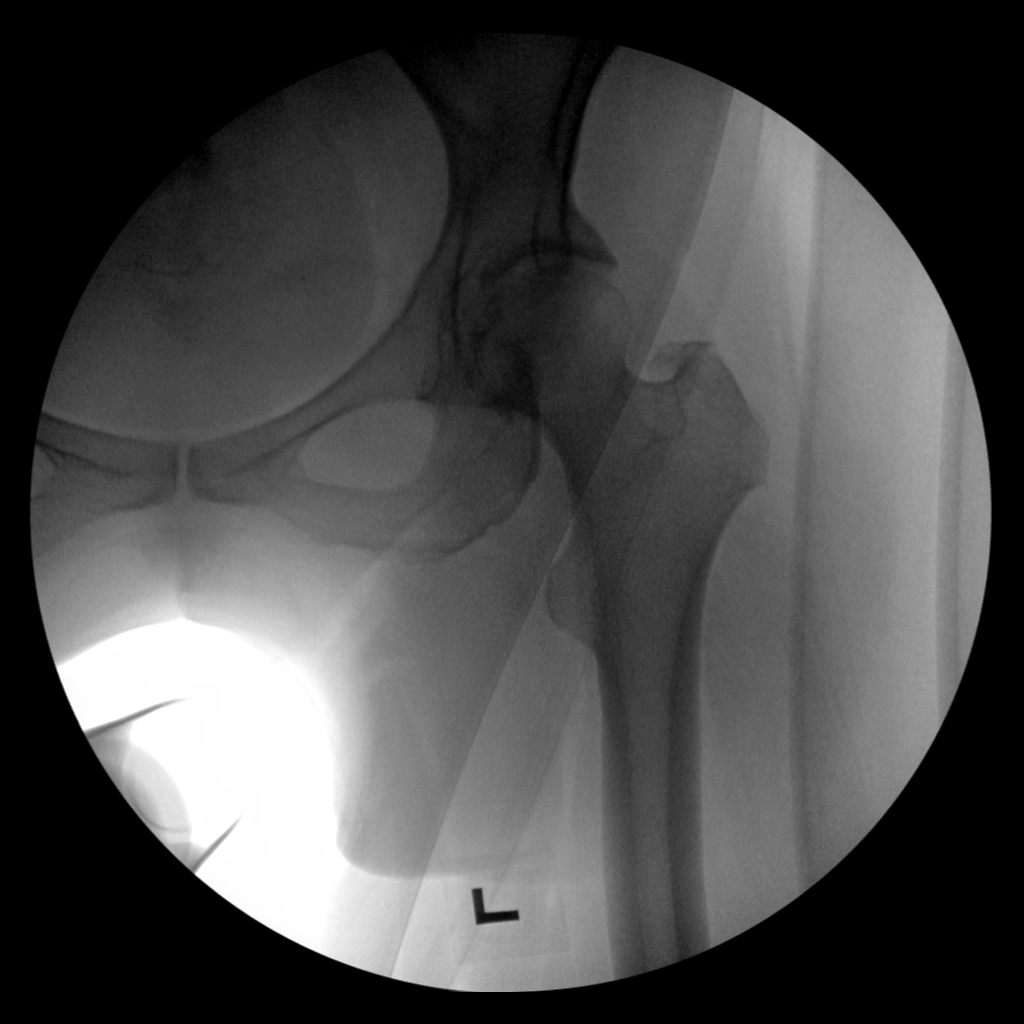
[im 2/3]
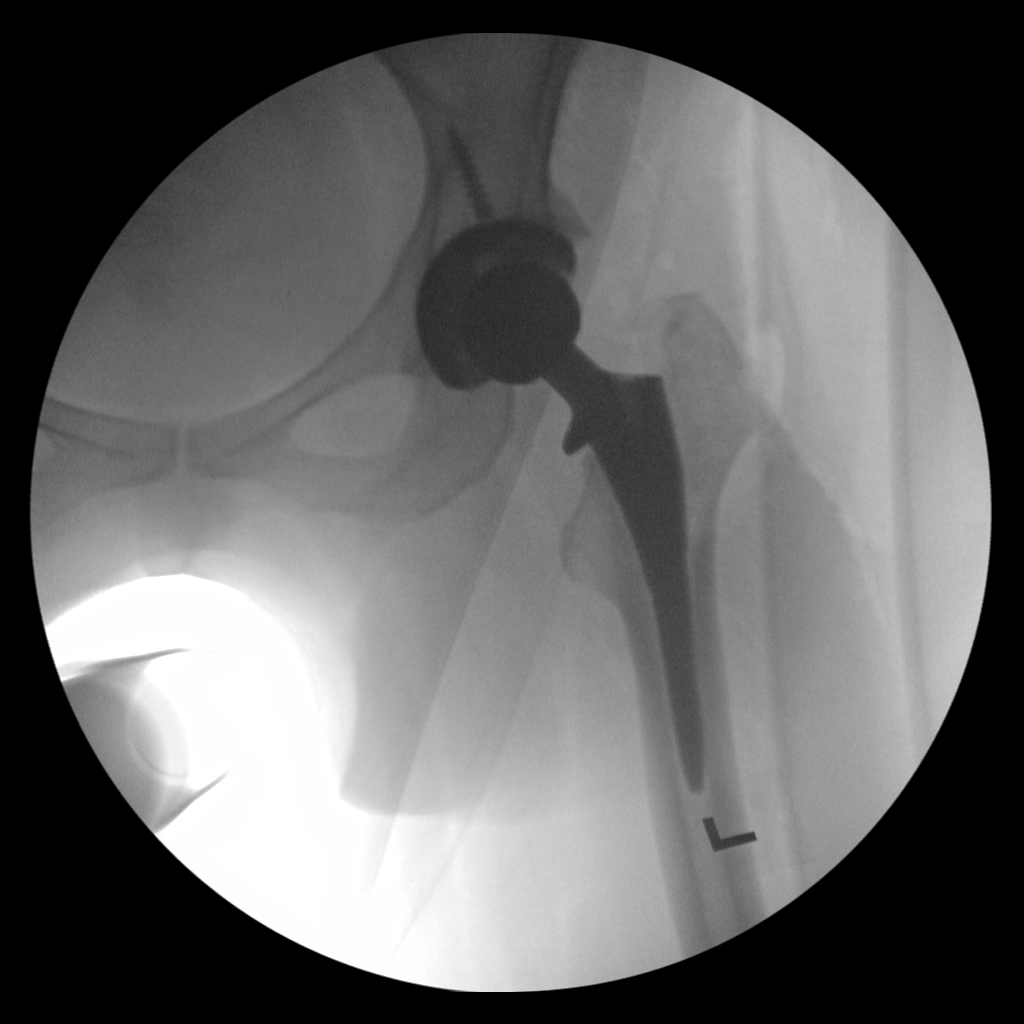
[im 3/3]
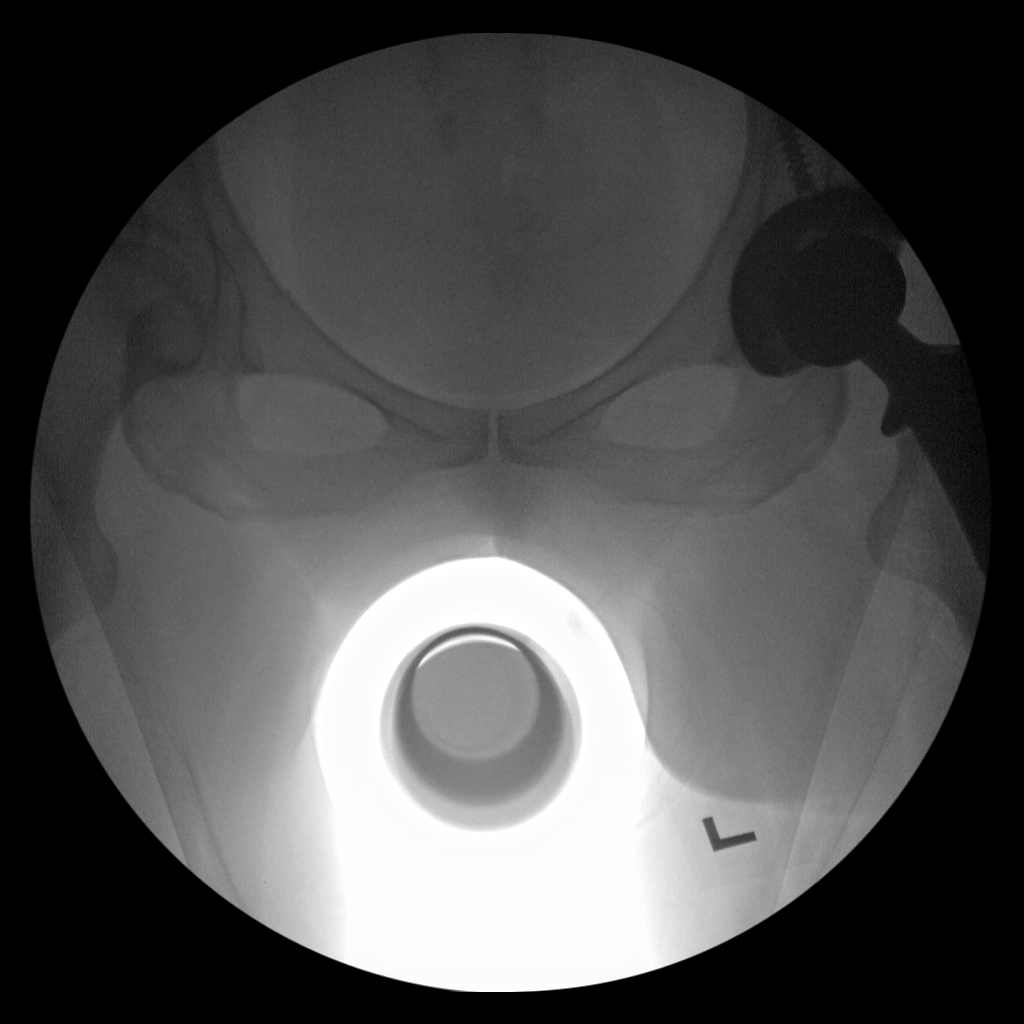

[3 of 3 positions shown; findings below may reference images not displayed]

FINDINGS: Three intraoperative fluoroscopic images of the left hip are
submitted. On the provided images, there are findings of interval
left total hip arthroplasty. The femoral and acetabular components
appear well seated. No unexpected finding on the provided views.
IMPRESSION: Three intraoperative fluoroscopic images of the left hip from left
total hip arthroplasty, as described.

## 2023-05-12 ENCOUNTER — Encounter: Payer: Self-pay | Admitting: Orthopaedic Surgery

## 2023-05-15 ENCOUNTER — Encounter: Payer: Self-pay | Admitting: Orthopaedic Surgery

## 2023-05-19 ENCOUNTER — Encounter: Payer: Self-pay | Admitting: Orthopaedic Surgery

## 2023-05-26 ENCOUNTER — Ambulatory Visit
Admission: RE | Admit: 2023-05-26 | Discharge: 2023-05-26 | Disposition: A | Discharge status: Home / Self Care | Source: Ambulatory Visit | Attending: Orthopaedic Surgery | Admitting: Orthopaedic Surgery

## 2023-05-26 DIAGNOSIS — G8929 Other chronic pain: Secondary | ICD-10-CM

## 2023-05-29 ENCOUNTER — Encounter: Payer: Self-pay | Admitting: Family Medicine

## 2023-06-03 ENCOUNTER — Encounter: Payer: Self-pay | Admitting: Orthopaedic Surgery

## 2023-06-05 NOTE — Progress Notes (Signed)
 Tried to call to schedule.No answer. LMOM for patient to call us back to schedule follow up.

## 2023-06-05 NOTE — Progress Notes (Signed)
Needs follow up thank you.

## 2023-06-12 ENCOUNTER — Ambulatory Visit: Admitting: Orthopaedic Surgery

## 2023-06-12 DIAGNOSIS — S83242A Other tear of medial meniscus, current injury, left knee, initial encounter: Secondary | ICD-10-CM

## 2023-06-12 NOTE — Progress Notes (Signed)
 Office Visit Note   [name]           Date of Birth: 05/13/1965           MRN: [id number] Visit Date: 06/12/2023              Requested by: Melida Quitter, PA [address] [place],  [place] [address] PCP: Melida Quitter, PA   Assessment & Plan: Visit Diagnoses:  1. Acute medial meniscus tear of left knee, initial encounter     Plan: MRI shows a complex tear of the medial meniscus with extrusion.  There is subchondral edema of the medial femoral condyle.  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  Findings discussed with the patient and treatment options were reviewed.  She will think about her options.  Would like an estimate of surgical cost for her knee scope.  Follow-Up Instructions: No follow-ups on file.   Orders:  No orders of the defined types were placed in this encounter.  No orders of the defined types were placed in this encounter.     Procedures: No procedures performed   Clinical Data: No additional findings.   Subjective: Chief Complaint  Patient presents with   Left Knee - Pain    HPI [name] returns today to discuss left knee MRI scan.  Overall feels like she is doing better.  It does swell and is more symptomatic after activity. Review of Systems  Constitutional: Negative.   HENT: Negative.    Eyes: Negative.   Respiratory: Negative.    Cardiovascular: Negative.   Endocrine: Negative.   Musculoskeletal: Negative.   Neurological: Negative.   Hematological: Negative.   Psychiatric/Behavioral: Negative.    All other systems reviewed and are negative.    Objective: Vital Signs: There were no vitals taken for this visit.  Physical Exam Vitals and nursing note reviewed.  Constitutional:      Appearance: She is well-developed.  HENT:     Head: Normocephalic and atraumatic.  Pulmonary:     Effort: Pulmonary effort is normal.  Abdominal:     Palpations: Abdomen is soft.  Musculoskeletal:     Cervical  back: Neck supple.  Skin:    General: Skin is warm.     Capillary Refill: Capillary refill takes less than 2 seconds.  Neurological:     Mental Status: She is alert and oriented to person, place, and time.  Psychiatric:        Behavior: Behavior normal.        Thought Content: Thought content normal.        Judgment: Judgment normal.     Ortho Exam Exam of the left knee is unchanged from prior visit. Specialty Comments:  No specialty comments available.  Imaging: No results found.   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   Allergic rhinitis due to pollen 07/13/2020   Mixed hyperlipidemia 07/13/2020  Primary osteoarthritis of left hip 04/25/2020   Hypothyroidism 09/30/2019   Cholelithiases 10/11/2010   Past Medical History:  Diagnosis Date   Generalized headaches    GERD (gastroesophageal reflux disease)    Hypothyroidism    Thyroid disease    hypo    Family History  Problem Relation Age of Onset   Stroke Father    Transient ischemic attack Father    Heart disease Mother    COPD Mother    Cancer Mother    Breast cancer Mother    Heart attack Mother    Diabetes Mother    Breast cancer Maternal Aunt    Breast cancer Maternal Grandmother    Diabetes Paternal Grandmother     Past Surgical History:  Procedure Laterality Date   ABDOMINAL HYSTERECTOMY N/A    [facility]reesia 08/30/2019    APPENDECTOMY  2003   CESAREAN SECTION  2005   CESAREAN SECTION N/A    [facility]reesia 08/30/2019   CHOLECYSTECTOMY     DIAGNOSTIC LAPAROSCOPY     Lap chole.   ROBOTIC ASSISTED TOTAL HYSTERECTOMY Bilateral 09/29/2012   Procedure: ROBOTIC ASSISTED TOTAL HYSTERECTOMY WITH BILATERAL SALPINGECTOMY;  Surgeon: Serita Kyle, MD;  Location: [facility];  Service: Gynecology;  Laterality: Bilateral;  3 hrs.   TONSILLECTOMY     TONSILLECTOMY  1969   TOTAL HIP ARTHROPLASTY Left 08/21/2020   Procedure: LEFT TOTAL HIP ARTHROPLASTY ANTERIOR APPROACH;  Surgeon: Tarry Kos, MD;  Location: [facility];  Service: Orthopedics;  Laterality: Left;  3-C   Social History   Occupational History   Occupation: document Copywriter, advertising: [employer]  Tobacco Use   Smoking status: Former    Current packs/day: 0.00    Types: Cigarettes    Quit date: 03/11/2013    Years since quitting: 10.2    Passive exposure: Past   Smokeless tobacco: Never  Vaping Use   Vaping status: Never Used  Substance and Sexual Activity   Alcohol use: Yes    Comment: rare   Drug use: No   Sexual activity: Yes    Birth control/protection: None

## 2023-06-19 ENCOUNTER — Encounter: Payer: Self-pay | Admitting: Orthopaedic Surgery

## 2023-07-01 ENCOUNTER — Telehealth: Payer: Self-pay | Admitting: *Deleted

## 2023-07-01 NOTE — Telephone Encounter (Signed)
 Pt called back and we told her that if she could remember when the referral was discussed we could find the information and Dr. Arabella Beach could review that to see if he could send the referral or if not she can schedule an appointment to discuss this with Dr. Arabella Beach.

## 2023-07-01 NOTE — Telephone Encounter (Signed)
 Copied from CRM [id number]. Topic: Referral - Request for Referral >> Jul 01, 2023  1:14 PM [name] wrote: Did the patient discuss referral with their provider in the last year? Yes (If No - schedule appointment) (If Yes - send message)  Appointment offered? Yes  Type of order/referral and detailed reason for visit: [facility]  Preference of office, provider, location: [place], [place]   If referral order, have you been seen by this specialty before? No (If Yes, this issue or another issue? When? Where?  Can we respond through MyChart? No

## 2023-07-09 ENCOUNTER — Encounter: Payer: Self-pay | Admitting: Orthopaedic Surgery

## 2023-07-09 NOTE — Telephone Encounter (Signed)
 She would like to proceed with surgery.  Please let me know if you need a surgery sheet.

## 2023-09-13 ENCOUNTER — Other Ambulatory Visit: Payer: Self-pay | Admitting: Family Medicine

## 2023-09-13 DIAGNOSIS — E782 Mixed hyperlipidemia: Secondary | ICD-10-CM

## 2023-09-13 DIAGNOSIS — E039 Hypothyroidism, unspecified: Secondary | ICD-10-CM

## 2023-09-19 ENCOUNTER — Other Ambulatory Visit: Payer: Self-pay | Admitting: Family Medicine

## 2023-09-19 DIAGNOSIS — R17 Unspecified jaundice: Secondary | ICD-10-CM

## 2023-09-19 DIAGNOSIS — E039 Hypothyroidism, unspecified: Secondary | ICD-10-CM

## 2023-09-22 ENCOUNTER — Other Ambulatory Visit: Payer: No Typology Code available for payment source

## 2023-09-22 DIAGNOSIS — R17 Unspecified jaundice: Secondary | ICD-10-CM

## 2023-09-22 DIAGNOSIS — E039 Hypothyroidism, unspecified: Secondary | ICD-10-CM

## 2023-09-23 ENCOUNTER — Ambulatory Visit: Payer: Self-pay | Admitting: Family Medicine

## 2023-09-23 LAB — COMPREHENSIVE METABOLIC PANEL WITH GFR
ALT: 16 IU/L (ref 0–32)
AST: 17 IU/L (ref 0–40)
Albumin: 4.5 g/dL (ref 3.8–4.9)
Alkaline Phosphatase: 110 IU/L (ref 44–121)
BUN/Creatinine Ratio: 23 (ref 9–23)
BUN: 15 mg/dL (ref 6–24)
Bilirubin Total: 1.2 mg/dL (ref 0.0–1.2)
CO2: 24 mmol/L (ref 20–29)
Calcium: 9.5 mg/dL (ref 8.7–10.2)
Chloride: 106 mmol/L (ref 96–106)
Creatinine, Ser: 0.64 mg/dL (ref 0.57–1.00)
Globulin, Total: 2 g/dL (ref 1.5–4.5)
Glucose: 101 mg/dL — ABNORMAL HIGH (ref 70–99)
Potassium: 4.1 mmol/L (ref 3.5–5.2)
Sodium: 143 mmol/L (ref 134–144)
Total Protein: 6.5 g/dL (ref 6.0–8.5)
eGFR: 102 mL/min/1.73 (ref >59)

## 2023-09-23 LAB — BILIRUBIN, DIRECT: Bilirubin, Direct: 0.31 mg/dL (ref 0.00–0.40)

## 2023-09-23 LAB — TSH: TSH: 2 u[IU]/mL (ref 0.450–4.500)

## 2023-09-29 ENCOUNTER — Encounter: Payer: Self-pay | Admitting: Family Medicine

## 2023-09-29 ENCOUNTER — Ambulatory Visit: Payer: No Typology Code available for payment source | Admitting: Family Medicine

## 2023-09-29 ENCOUNTER — Ambulatory Visit: Admitting: Family Medicine

## 2023-09-29 VITALS — BP 130/87 | HR 78 | Ht 63.0 in | Wt 175.4 lb

## 2023-09-29 DIAGNOSIS — M1712 Unilateral primary osteoarthritis, left knee: Secondary | ICD-10-CM | POA: Diagnosis not present

## 2023-09-29 DIAGNOSIS — K219 Gastro-esophageal reflux disease without esophagitis: Secondary | ICD-10-CM

## 2023-09-29 DIAGNOSIS — M179 Osteoarthritis of knee, unspecified: Secondary | ICD-10-CM | POA: Insufficient documentation

## 2023-09-29 DIAGNOSIS — E039 Hypothyroidism, unspecified: Secondary | ICD-10-CM | POA: Diagnosis not present

## 2023-09-29 NOTE — Assessment & Plan Note (Signed)
 Presents for follow-up on chronic arthritis pain, primarily affecting the knees, which impacts sleep. A right hip replacement was performed 3 years ago. Currently awaiting a left knee scope in August for a complex meniscus tear diagnosed on MRI. Pain is managed with naproxen at night, ibuprofen  as needed during the day, and topical Voltaren gel. Reports joint pain, not muscle aches. Denies issues with atorvastatin . - Continue current use of naproxen and ibuprofen  as needed. - Discussed risks of long-term daily NSAID use, including stomach ulcers and GI bleeding. - Offered Celebrex as an alternative to reduce GI risk, but acknowledged the slightly higher cardiovascular risk profile. Counselled that for a low-risk individual, Celebrex is a reasonable option. - Plan to continue current regimen. May message to request a trial of Celebrex in the future if desired.

## 2023-09-29 NOTE — Progress Notes (Signed)
 Established Patient Office Visit  Subjective   Patient ID: [name], female    DOB: June 08, 1965  Age: 58 y.o. MRN: [id number]  Chief Complaint  Patient presents with   Medical Management of Chronic Issues    HPI   Subjective - Arthritis pain, mainly in knees, causing difficulty sleeping. Manages with NSAIDs. - Intermittent heartburn, occurring about once a week or every couple of weeks. Associated with a red patch on the nose. - Reports concern about long-term use of omeprazole and its link to Alzheimer's disease.  Medications: Omeprazole as needed for heartburn. Atorvastatin  10 mg daily for cholesterol, takes with CoQ10. Synthroid . Vitamin D3 1000 units once daily. Codeine once daily. Multivitamin once daily. Naproxen one tablet at night, almost daily recently. Ibuprofen  as needed during the day. Uses topical Voltaren gel.  PMH, PSH, FH, Social Hx: PMH: Arthritis, hypercholesterolemia, hypothyroidism, history of mildly elevated bilirubin levels. No history of heart disease, heart attack, or stroke. PSH: Right hip replacement 3 years ago. Scheduled for a left knee scope in August for a complex meniscus tear.  ROS: Musculoskeletal: Reports joint pain, primarily in knees. Denies muscle aches. Gastrointestinal: Reports intermittent heartburn. Denies issues related to NSAID use, like stomach ulcers. Integumentary: Reports a red patch on the chest during heartburn flare-ups.    The 10-year ASCVD risk score (Arnett DK, et al., 2019) is: 2.4%  Health Maintenance Due  Topic Date Due   Hepatitis B Vaccines (1 of 3 - 19+ 3-dose series) Never done   COVID-19 Vaccine (6 - 2024-25 season) 03/22/2023      Objective:     BP 130/87   Pulse 78   Ht 5' 3 (1.6 m)   Wt 175 lb 6.4 oz (79.6 kg)   SpO2 98%   BMI 31.07 kg/m    Physical Exam General: Alert, oriented Pulmonary: No respiratory distress Psych: Pleasant affect.   No results found for any visits on 09/29/23.       Assessment & Plan:   Gastroesophageal reflux disease without esophagitis Assessment & Plan: Reports intermittent heartburn about once weekly, managed with as-needed omeprazole. Reports a red patch on the chest with episodes. Expressed concern about long-term risks of PPIs like omeprazole. - Advised on the long-term risks of daily PPI use (e.g., osteoporosis, altered nutrient absorption), noting that intermittent use is safe. - Recommended trying over-the-counter Pepcid (an H2 blocker) as an alternative with a lower long-term risk profile. - Will try Pepcid for reflux symptoms.   Primary osteoarthritis of left knee Assessment & Plan: Presents for follow-up on chronic arthritis pain, primarily affecting the knees, which impacts sleep. A right hip replacement was performed 3 years ago. Currently awaiting a left knee scope in August for a complex meniscus tear diagnosed on MRI. Pain is managed with naproxen at night, ibuprofen  as needed during the day, and topical Voltaren gel. Reports joint pain, not muscle aches. Denies issues with atorvastatin . - Continue current use of naproxen and ibuprofen  as needed. - Discussed risks of long-term daily NSAID use, including stomach ulcers and GI bleeding. - Offered Celebrex as an alternative to reduce GI risk, but acknowledged the slightly higher cardiovascular risk profile. Counselled that for a low-risk individual, Celebrex is a reasonable option. - Plan to continue current regimen. May message to request a trial of Celebrex in the future if desired.   Acquired hypothyroidism Assessment & Plan: Normal tsh.  Continue current dose      Return in about 6 months (around 03/31/2024) for physical.  Toribio MARLA Slain, MD

## 2023-09-29 NOTE — Assessment & Plan Note (Signed)
 Reports intermittent heartburn about once weekly, managed with as-needed omeprazole. Reports a red patch on the chest with episodes. Expressed concern about long-term risks of PPIs like omeprazole. - Advised on the long-term risks of daily PPI use (e.g., osteoporosis, altered nutrient absorption), noting that intermittent use is safe. - Recommended trying over-the-counter Pepcid (an H2 blocker) as an alternative with a lower long-term risk profile. - Will try Pepcid for reflux symptoms.

## 2023-09-29 NOTE — Assessment & Plan Note (Signed)
 Normal tsh.  Continue current dose

## 2023-09-29 NOTE — Patient Instructions (Signed)
 It was nice to see you today,  We addressed the following topics today: -You can try taking over-the-counter Pepcid instead of omeprazole for overall.  As you are associated side effects with long-term use - If you want to switch from your over-the-counter naproxen and ibuprofen  to a prescription Celebrex that you could take twice a day, let me know and I will send that order in.  Otherwise we will follow-up with Saddie in 6 months for your physical.  Have a great day,  Rolan Slain, MD

## 2023-10-16 ENCOUNTER — Encounter: Admitting: Physician Assistant

## 2023-10-23 ENCOUNTER — Other Ambulatory Visit: Payer: Self-pay | Admitting: Orthopaedic Surgery

## 2023-10-23 ENCOUNTER — Encounter: Payer: Self-pay | Admitting: Orthopaedic Surgery

## 2023-10-23 DIAGNOSIS — S83232A Complex tear of medial meniscus, current injury, left knee, initial encounter: Secondary | ICD-10-CM | POA: Diagnosis not present

## 2023-10-23 MED ORDER — HYDROCODONE-ACETAMINOPHEN 5-325 MG PO TABS: 1.0000 | ORAL_TABLET | Freq: Two times a day (BID) | ORAL | 0 refills | Status: AC | PRN

## 2023-10-30 ENCOUNTER — Ambulatory Visit: Admitting: Orthopaedic Surgery

## 2023-10-30 ENCOUNTER — Encounter: Payer: Self-pay | Admitting: Orthopaedic Surgery

## 2023-10-30 DIAGNOSIS — S83242A Other tear of medial meniscus, current injury, left knee, initial encounter: Secondary | ICD-10-CM

## 2023-10-30 NOTE — Progress Notes (Signed)
 Post-Op Visit Note   [name]           Date of Birth: 12/29/1965           MRN: [id number] Visit Date: 10/30/2023 PCP: Gayle Saddie FALCON, PA-C   Assessment & Plan:  Chief Complaint:  Chief Complaint  Patient presents with   Left Knee - Routine Post Op   Visit Diagnoses:  1. Acute medial meniscus tear of left knee, initial encounter     Plan: History of Present Illness [name] is a 58 year old female who presents for 1 week postop visit s/p left knee scope.  Doing well overall.    Left knee exam shows healed surgical incision.  No signs of infection.  Range of motion progressing well.  Assessment and Plan - Remove sutures and apply strips, removable in one week. - Advise resumption of normal showering. - Encourage low-impact activities: cycling, elliptical, walking, swimming. - Advise against high-impact activities like running. - Recommend maintaining a healthy weight to prevent arthritis progression. - Schedule follow-up in four weeks for re-evaluation.  Follow-Up Instructions: Return in about 4 weeks (around 11/27/2023) for with lindsey.   Orders:  No orders of the defined types were placed in this encounter.  No orders of the defined types were placed in this encounter.   Imaging: No results found.  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   Vitamin D deficiency 07/13/2020   Gastroesophageal reflux disease 07/13/2020   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   Allergic rhinitis due to pollen 07/13/2020   Mixed hyperlipidemia 07/13/2020   Primary osteoarthritis of left hip 04/25/2020   Hypothyroidism 09/30/2019   Cholelithiases 10/11/2010   Past Medical History:  Diagnosis Date   Generalized headaches    GERD (gastroesophageal reflux disease)    Hypothyroidism    Thyroid disease    hypo    Family History  Problem Relation Age of Onset   Stroke Father    Transient ischemic attack Father    Heart disease Mother    COPD Mother    Cancer Mother    Breast cancer Mother    Heart attack Mother    Diabetes Mother    Breast cancer Maternal Aunt    Breast cancer Maternal Grandmother    Diabetes Paternal Grandmother     Past Surgical History:  Procedure Laterality Date   ABDOMINAL HYSTERECTOMY N/A    [employer]reesia 08/30/2019   APPENDECTOMY  2003   CESAREAN SECTION  2005   CESAREAN SECTION N/A    Phreesia 08/30/2019   CHOLECYSTECTOMY     DIAGNOSTIC LAPAROSCOPY     Lap chole.   ROBOTIC ASSISTED TOTAL HYSTERECTOMY Bilateral 09/29/2012   Procedure: ROBOTIC ASSISTED TOTAL HYSTERECTOMY WITH BILATERAL SALPINGECTOMY;  Surgeon: Dickie DELENA Carder, MD;  Location: [facility];  Service: Gynecology;  Laterality: Bilateral;  3 hrs.   TONSILLECTOMY     TONSILLECTOMY  1969   TOTAL HIP ARTHROPLASTY Left 08/21/2020   Procedure: LEFT TOTAL HIP ARTHROPLASTY ANTERIOR APPROACH;  Surgeon: Jerri Kay HERO, MD;  Location: [facility];  Service: Orthopedics;  Laterality: Left;  3-C   Social History   Occupational History   Occupation: document Copywriter, advertising: [employer]  Tobacco Use   Smoking status: Former    Current packs/day: 0.00    Types: Cigarettes    Quit date: 03/11/2013    Years since quitting: 10.6    Passive exposure: Past   Smokeless tobacco: Never  Vaping Use   Vaping status: Never Used  Substance and Sexual Activity   Alcohol use: Yes    Comment: rare   Drug use: No   Sexual activity: Yes    Birth control/protection: None

## 2023-11-14 ENCOUNTER — Encounter: Payer: Self-pay | Admitting: Family Medicine

## 2023-11-27 ENCOUNTER — Ambulatory Visit: Admitting: Physician Assistant

## 2023-11-27 DIAGNOSIS — Z9889 Other specified postprocedural states: Secondary | ICD-10-CM

## 2023-11-27 NOTE — Progress Notes (Signed)
 Post-Op Visit Note   [name]           Date of Birth: Jan 16, 1966           MRN: [id number] Visit Date: 11/27/2023 PCP: Gayle Saddie FALCON, PA-C   Assessment & Plan:  Chief Complaint:  Chief Complaint  Patient presents with   Left Knee - Pain    s/p left knee scope.     Visit Diagnoses:  1. S/P left knee arthroscopy     Plan: Patient is a pleasant 58 year old female who comes in today for follow-up of her left knee arthroscopy.  She is doing much better.  She does have some discomfort which is relieved with NSAIDs.  Examination of the left knee reveals range of motion 0 to 125 degrees.  Calf soft nontender.  He is neurovasc intact distally.  At this point, she will continue to advance with activity as tolerated.  Follow-up as needed.  Follow-Up Instructions: Return if symptoms worsen or fail to improve.   Orders:  No orders of the defined types were placed in this encounter.  No orders of the defined types were placed in this encounter.   Imaging: No results found.  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   Vitamin D deficiency 07/13/2020   Gastroesophageal reflux disease 07/13/2020   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   Allergic rhinitis due to pollen 07/13/2020   Mixed hyperlipidemia 07/13/2020   Primary osteoarthritis of left hip 04/25/2020   Hypothyroidism 09/30/2019   Cholelithiases 10/11/2010   Past Medical History:  Diagnosis Date   Generalized  headaches    GERD (gastroesophageal reflux disease)    Hypothyroidism    Thyroid disease    hypo    Family History  Problem Relation Age of Onset   Stroke Father    Transient ischemic attack Father    Heart disease Mother    COPD Mother    Cancer Mother    Breast cancer Mother    Heart attack Mother    Diabetes Mother    Breast cancer Maternal Aunt    Breast cancer Maternal Grandmother    Diabetes Paternal Grandmother     Past Surgical History:  Procedure Laterality Date   ABDOMINAL HYSTERECTOMY N/A    [employer]reesia 08/30/2019   APPENDECTOMY  2003   CESAREAN SECTION  2005   CESAREAN SECTION N/A    Phreesia 08/30/2019   CHOLECYSTECTOMY     DIAGNOSTIC LAPAROSCOPY     Lap chole.   ROBOTIC ASSISTED TOTAL HYSTERECTOMY Bilateral 09/29/2012   Procedure: ROBOTIC ASSISTED TOTAL HYSTERECTOMY WITH BILATERAL SALPINGECTOMY;  Surgeon: Dickie DELENA Carder, MD;  Location: [facility];  Service: Gynecology;  Laterality: Bilateral;  3 hrs.   TONSILLECTOMY     TONSILLECTOMY  1969   TOTAL HIP ARTHROPLASTY Left 08/21/2020   Procedure: LEFT TOTAL HIP ARTHROPLASTY ANTERIOR APPROACH;  Surgeon: Jerri Kay HERO, MD;  Location: [facility] OR;  Service: Orthopedics;  Laterality: Left;  3-C   Social History   Occupational History  Occupation: document Copywriter, advertising: [employer]  Tobacco Use   Smoking status: Former    Current packs/day: 0.00    Types: Cigarettes    Quit date: 03/11/2013    Years since quitting: 10.7    Passive exposure: Past   Smokeless tobacco: Never  Vaping Use   Vaping status: Never Used  Substance and Sexual Activity   Alcohol use: Yes    Comment: rare   Drug use: No   Sexual activity: Yes    Birth control/protection: None

## 2024-01-12 ENCOUNTER — Encounter: Payer: Self-pay | Admitting: Radiology

## 2024-03-08 LAB — HM MAMMOGRAPHY

## 2024-03-12 ENCOUNTER — Other Ambulatory Visit: Payer: Self-pay

## 2024-03-12 DIAGNOSIS — E782 Mixed hyperlipidemia: Secondary | ICD-10-CM

## 2024-03-12 DIAGNOSIS — E039 Hypothyroidism, unspecified: Secondary | ICD-10-CM

## 2024-03-24 ENCOUNTER — Other Ambulatory Visit

## 2024-03-31 ENCOUNTER — Ambulatory Visit

## 2024-04-26 ENCOUNTER — Other Ambulatory Visit

## 2024-05-03 ENCOUNTER — Ambulatory Visit
